# Patient Record
Sex: Female | Born: 1945
Health system: Southern US, Community
[De-identification: ages and names within clinical notes are randomized; demographics above are authoritative.]

## PROBLEM LIST (undated history)

## (undated) DIAGNOSIS — K219 Gastro-esophageal reflux disease without esophagitis: Secondary | ICD-10-CM

## (undated) DIAGNOSIS — R51 Headache: Secondary | ICD-10-CM

## (undated) DIAGNOSIS — T7840XA Allergy, unspecified, initial encounter: Secondary | ICD-10-CM

## (undated) DIAGNOSIS — M419 Scoliosis, unspecified: Secondary | ICD-10-CM

## (undated) DIAGNOSIS — R079 Chest pain, unspecified: Secondary | ICD-10-CM

## (undated) DIAGNOSIS — J45909 Unspecified asthma, uncomplicated: Secondary | ICD-10-CM

## (undated) DIAGNOSIS — R011 Cardiac murmur, unspecified: Secondary | ICD-10-CM

## (undated) DIAGNOSIS — M549 Dorsalgia, unspecified: Secondary | ICD-10-CM

## (undated) DIAGNOSIS — G8929 Other chronic pain: Secondary | ICD-10-CM

## (undated) DIAGNOSIS — R0602 Shortness of breath: Secondary | ICD-10-CM

## (undated) DIAGNOSIS — J302 Other seasonal allergic rhinitis: Secondary | ICD-10-CM

## (undated) HISTORY — DX: Allergy, unspecified, initial encounter: T78.40XA

## (undated) HISTORY — PX: COLONOSCOPY: SHX174

## (undated) HISTORY — PX: EYE SURGERY: SHX253

## (undated) HISTORY — PX: BREAST SURGERY: SHX581

## (undated) HISTORY — PX: ABDOMINOPLASTY: SUR9

---
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
## 2001-09-20 ENCOUNTER — Other Ambulatory Visit: Admission: RE | Admit: 2001-09-20 | Discharge: 2001-09-20 | Payer: Self-pay | Admitting: *Deleted

## 2002-09-14 ENCOUNTER — Other Ambulatory Visit: Admission: RE | Admit: 2002-09-14 | Discharge: 2002-09-14 | Payer: Self-pay | Admitting: *Deleted

## 2002-11-14 ENCOUNTER — Ambulatory Visit (HOSPITAL_COMMUNITY): Admission: RE | Admit: 2002-11-14 | Discharge: 2002-11-14 | Payer: Self-pay | Admitting: *Deleted

## 2002-11-14 ENCOUNTER — Encounter: Payer: Self-pay | Admitting: *Deleted

## 2004-04-24 ENCOUNTER — Other Ambulatory Visit: Admission: RE | Admit: 2004-04-24 | Discharge: 2004-04-24 | Payer: Self-pay | Admitting: Obstetrics and Gynecology

## 2005-04-28 ENCOUNTER — Other Ambulatory Visit: Admission: RE | Admit: 2005-04-28 | Discharge: 2005-04-28 | Payer: Self-pay | Admitting: Obstetrics and Gynecology

## 2008-12-28 ENCOUNTER — Other Ambulatory Visit: Admission: RE | Admit: 2008-12-28 | Discharge: 2008-12-28 | Payer: Self-pay | Admitting: Obstetrics and Gynecology

## 2012-03-15 ENCOUNTER — Ambulatory Visit
Admission: RE | Admit: 2012-03-15 | Discharge: 2012-03-15 | Disposition: A | Discharge status: Home / Self Care | Payer: Medicare Other | Source: Ambulatory Visit | Attending: Allergy and Immunology | Admitting: Allergy and Immunology

## 2012-03-15 ENCOUNTER — Other Ambulatory Visit: Payer: Self-pay | Admitting: Allergy and Immunology

## 2012-03-15 DIAGNOSIS — R05 Cough: Secondary | ICD-10-CM

## 2012-06-03 ENCOUNTER — Other Ambulatory Visit: Payer: Self-pay | Admitting: Obstetrics & Gynecology

## 2012-06-03 DIAGNOSIS — Z9189 Other specified personal risk factors, not elsewhere classified: Secondary | ICD-10-CM

## 2012-06-14 ENCOUNTER — Ambulatory Visit (HOSPITAL_COMMUNITY)
Admission: RE | Admit: 2012-06-14 | Discharge: 2012-06-14 | Disposition: A | Discharge status: Home / Self Care | Payer: Medicare Other | Source: Ambulatory Visit | Attending: Obstetrics & Gynecology | Admitting: Obstetrics & Gynecology

## 2012-06-14 DIAGNOSIS — Z780 Asymptomatic menopausal state: Secondary | ICD-10-CM | POA: Insufficient documentation

## 2012-06-14 DIAGNOSIS — Z9189 Other specified personal risk factors, not elsewhere classified: Secondary | ICD-10-CM

## 2012-06-14 DIAGNOSIS — Z13820 Encounter for screening for osteoporosis: Secondary | ICD-10-CM | POA: Insufficient documentation

## 2012-10-13 ENCOUNTER — Other Ambulatory Visit: Payer: Self-pay | Admitting: Obstetrics & Gynecology

## 2012-10-13 DIAGNOSIS — N72 Inflammatory disease of cervix uteri: Secondary | ICD-10-CM

## 2012-10-21 ENCOUNTER — Ambulatory Visit (HOSPITAL_COMMUNITY)
Admission: RE | Admit: 2012-10-21 | Discharge: 2012-10-21 | Disposition: A | Discharge status: Home / Self Care | Payer: Medicare Other | Source: Ambulatory Visit | Attending: Obstetrics & Gynecology | Admitting: Obstetrics & Gynecology

## 2012-10-21 DIAGNOSIS — N72 Inflammatory disease of cervix uteri: Secondary | ICD-10-CM | POA: Insufficient documentation

## 2012-10-21 DIAGNOSIS — D259 Leiomyoma of uterus, unspecified: Secondary | ICD-10-CM | POA: Insufficient documentation

## 2013-02-14 ENCOUNTER — Encounter (HOSPITAL_COMMUNITY): Payer: Self-pay | Admitting: Pharmacist

## 2013-02-22 ENCOUNTER — Telehealth: Payer: Self-pay | Admitting: *Deleted

## 2013-02-22 NOTE — Telephone Encounter (Signed)
Pt called to find out how many times Dr Tamela Oddi has performed the type of surgery that she is having on Friday. Reassure patient that Dr is proficient in that surgery.

## 2013-02-27 ENCOUNTER — Encounter (HOSPITAL_COMMUNITY): Payer: Self-pay

## 2013-02-27 ENCOUNTER — Encounter (HOSPITAL_COMMUNITY)
Admission: RE | Admit: 2013-02-27 | Discharge: 2013-02-27 | Disposition: A | Discharge status: Home / Self Care | Payer: Medicare Other | Source: Ambulatory Visit | Attending: Obstetrics & Gynecology | Admitting: Obstetrics & Gynecology

## 2013-02-27 HISTORY — DX: Cardiac murmur, unspecified: R01.1

## 2013-02-27 HISTORY — DX: Dorsalgia, unspecified: M54.9

## 2013-02-27 HISTORY — DX: Scoliosis, unspecified: M41.9

## 2013-02-27 HISTORY — DX: Unspecified asthma, uncomplicated: J45.909

## 2013-02-27 HISTORY — DX: Other seasonal allergic rhinitis: J30.2

## 2013-02-27 HISTORY — DX: Gastro-esophageal reflux disease without esophagitis: K21.9

## 2013-02-27 HISTORY — DX: Headache: R51

## 2013-02-27 HISTORY — DX: Chest pain, unspecified: R07.9

## 2013-02-27 HISTORY — DX: Other chronic pain: G89.29

## 2013-02-27 HISTORY — DX: Shortness of breath: R06.02

## 2013-02-27 LAB — CBC
Platelets: 282 10*3/uL (ref 150–400)
RDW: 13 % (ref 11.5–15.5)
WBC: 8.3 10*3/uL (ref 4.0–10.5)

## 2013-02-27 NOTE — Patient Instructions (Signed)
Your procedure is scheduled on:03/03/13  Enter through the Main Entrance at :1030am Pick up desk phone and dial [phone] and inform us of your arrival.  Please call [phone] if you have any problems the morning of surgery.  Remember: Do not eat after midnight: Thursday   Take these meds the morning of surgery with a sip of water:Prevacid, Claritin  DO NOT wear jewelry, eye make-up, lipstick,body lotion, or dark fingernail polish. Do not shave for 48 hours prior to surgery.  If you are to be admitted after surgery, leave suitcase in car until your room has been assigned. Patients discharged on the day of surgery will not be allowed to drive home.

## 2013-03-03 ENCOUNTER — Encounter (HOSPITAL_COMMUNITY): Payer: Self-pay | Admitting: Anesthesiology

## 2013-03-03 ENCOUNTER — Encounter (HOSPITAL_COMMUNITY)
Admission: RE | Disposition: A | Discharge status: Home / Self Care | Payer: Self-pay | Source: Ambulatory Visit | Attending: Obstetrics & Gynecology

## 2013-03-03 ENCOUNTER — Ambulatory Visit (HOSPITAL_COMMUNITY): Payer: Medicare Other

## 2013-03-03 ENCOUNTER — Ambulatory Visit (HOSPITAL_COMMUNITY)
Admission: RE | Admit: 2013-03-03 | Discharge: 2013-03-03 | Disposition: A | Discharge status: Home / Self Care | Payer: Medicare Other | Source: Ambulatory Visit | Attending: Obstetrics & Gynecology | Admitting: Obstetrics & Gynecology

## 2013-03-03 ENCOUNTER — Ambulatory Visit (HOSPITAL_COMMUNITY): Payer: Medicare Other | Admitting: Anesthesiology

## 2013-03-03 DIAGNOSIS — N882 Stricture and stenosis of cervix uteri: Secondary | ICD-10-CM | POA: Diagnosis present

## 2013-03-03 DIAGNOSIS — K219 Gastro-esophageal reflux disease without esophagitis: Secondary | ICD-10-CM | POA: Insufficient documentation

## 2013-03-03 HISTORY — PX: EXAMINATION UNDER ANESTHESIA: SHX1540

## 2013-03-03 HISTORY — PX: DILATION AND CURETTAGE OF UTERUS: SHX78

## 2013-03-03 SURGERY — EXAM UNDER ANESTHESIA
Anesthesia: Monitor Anesthesia Care | Site: Vagina | Wound class: Clean Contaminated

## 2013-03-03 MED ORDER — LIDOCAINE HCL (CARDIAC) 20 MG/ML IV SOLN
INTRAVENOUS | Status: AC
  Filled 2013-03-03: qty 5

## 2013-03-03 MED ORDER — PROPOFOL INFUSION 10 MG/ML OPTIME
INTRAVENOUS | Status: DC | PRN
  Administered 2013-03-03: 100 ug/kg/min via INTRAVENOUS

## 2013-03-03 MED ORDER — ONDANSETRON HCL 4 MG/2ML IJ SOLN
INTRAMUSCULAR | Status: AC
  Filled 2013-03-03: qty 2

## 2013-03-03 MED ORDER — FENTANYL CITRATE 0.05 MG/ML IJ SOLN
INTRAMUSCULAR | Status: AC
  Filled 2013-03-03: qty 2

## 2013-03-03 MED ORDER — FENTANYL CITRATE 0.05 MG/ML IJ SOLN: 25.0000 ug | INTRAMUSCULAR | Status: DC | PRN

## 2013-03-03 MED ORDER — ONDANSETRON HCL 4 MG/2ML IJ SOLN: 4.0000 mg | Freq: Four times a day (QID) | INTRAMUSCULAR | Status: DC | PRN

## 2013-03-03 MED ORDER — MIDAZOLAM HCL 2 MG/2ML IJ SOLN
INTRAMUSCULAR | Status: AC
  Filled 2013-03-03: qty 2

## 2013-03-03 MED ORDER — PROPOFOL 10 MG/ML IV EMUL
INTRAVENOUS | Status: DC | PRN
  Administered 2013-03-03: 30 mg via INTRAVENOUS
  Administered 2013-03-03: 20 mg via INTRAVENOUS

## 2013-03-03 MED ORDER — ACETAMINOPHEN 325 MG PO TABS: 650.0000 mg | ORAL_TABLET | ORAL | Status: DC | PRN

## 2013-03-03 MED ORDER — DEXAMETHASONE SODIUM PHOSPHATE 10 MG/ML IJ SOLN
INTRAMUSCULAR | Status: DC | PRN
  Administered 2013-03-03: 10 mg via INTRAVENOUS

## 2013-03-03 MED ORDER — KETOROLAC TROMETHAMINE 30 MG/ML IJ SOLN
15.0000 mg | Freq: Once | INTRAMUSCULAR | Status: AC | PRN
  Administered 2013-03-03: 30 mg via INTRAVENOUS

## 2013-03-03 MED ORDER — MIDAZOLAM HCL 5 MG/5ML IJ SOLN
INTRAMUSCULAR | Status: DC | PRN
  Administered 2013-03-03: 2 mg via INTRAVENOUS

## 2013-03-03 MED ORDER — LACTATED RINGERS IV SOLN
INTRAVENOUS | Status: DC
  Administered 2013-03-03: 13:00:00 via INTRAVENOUS
  Administered 2013-03-03: 1000 mL via INTRAVENOUS
  Administered 2013-03-03: 11:00:00 via INTRAVENOUS

## 2013-03-03 MED ORDER — FENTANYL CITRATE 0.05 MG/ML IJ SOLN
INTRAMUSCULAR | Status: DC | PRN
  Administered 2013-03-03: 100 ug via INTRAVENOUS

## 2013-03-03 MED ORDER — ONDANSETRON HCL 4 MG/2ML IJ SOLN
INTRAMUSCULAR | Status: DC | PRN
  Administered 2013-03-03: 4 mg via INTRAVENOUS

## 2013-03-03 MED ORDER — PROPOFOL 10 MG/ML IV EMUL
INTRAVENOUS | Status: AC
  Filled 2013-03-03: qty 20

## 2013-03-03 MED ORDER — OXYCODONE HCL 5 MG PO TABS: 5.0000 mg | ORAL_TABLET | ORAL | Status: DC | PRN

## 2013-03-03 MED ORDER — KETOROLAC TROMETHAMINE 30 MG/ML IJ SOLN
INTRAMUSCULAR | Status: AC
  Filled 2013-03-03: qty 1

## 2013-03-03 MED ORDER — GLYCOPYRROLATE 0.2 MG/ML IJ SOLN
INTRAMUSCULAR | Status: AC
  Filled 2013-03-03: qty 1

## 2013-03-03 MED ORDER — DEXAMETHASONE SODIUM PHOSPHATE 10 MG/ML IJ SOLN
INTRAMUSCULAR | Status: AC
  Filled 2013-03-03: qty 1

## 2013-03-03 MED ORDER — OXYCODONE-ACETAMINOPHEN 5-325 MG PO TABS: 2.0000 | ORAL_TABLET | Freq: Four times a day (QID) | ORAL | Status: DC | PRN

## 2013-03-03 MED ORDER — ACETAMINOPHEN 650 MG RE SUPP: 650.0000 mg | RECTAL | Status: DC | PRN

## 2013-03-03 MED ORDER — LIDOCAINE HCL (PF) 1 % IJ SOLN
INTRAMUSCULAR | Status: DC | PRN
  Administered 2013-03-03: 10 mL

## 2013-03-03 MED ORDER — SODIUM CHLORIDE 0.9 % IJ SOLN: 3.0000 mL | INTRAMUSCULAR | Status: DC | PRN

## 2013-03-03 MED ORDER — SODIUM CHLORIDE 0.9 % IV SOLN: 250.0000 mL | INTRAVENOUS | Status: DC | PRN

## 2013-03-03 MED ORDER — PROMETHAZINE HCL 25 MG/ML IJ SOLN
6.2500 mg | INTRAMUSCULAR | Status: DC | PRN
  Administered 2013-03-03: 12.5 mg via INTRAVENOUS

## 2013-03-03 MED ORDER — SODIUM CHLORIDE 0.9 % IJ SOLN: 3.0000 mL | Freq: Two times a day (BID) | INTRAMUSCULAR | Status: DC

## 2013-03-03 MED ORDER — PROMETHAZINE HCL 25 MG/ML IJ SOLN
INTRAMUSCULAR | Status: AC
  Filled 2013-03-03: qty 1

## 2013-03-03 MED ORDER — LIDOCAINE HCL (CARDIAC) 20 MG/ML IV SOLN
INTRAVENOUS | Status: DC | PRN
  Administered 2013-03-03: 60 mg via INTRAVENOUS

## 2013-03-03 SURGICAL SUPPLY — 4 items
CLOTH BEACON ORANGE TIMEOUT ST (SAFETY) ×2 IMPLANT
COVER TABLE BACK 60X90 (DRAPES) ×2 IMPLANT
GLOVE BIO SURGEON STRL SZ 6.5 (GLOVE) ×2 IMPLANT
GOWN STRL REIN XL XLG (GOWN DISPOSABLE) ×4 IMPLANT

## 2013-03-03 NOTE — OR Nursing (Signed)
Ultrasound tech in room; Sport and exercise psychologist

## 2013-03-03 NOTE — Anesthesia Procedure Notes (Signed)
Procedure Name: MAC Date/Time: 03/03/2013 12:24 PM Performed by: Graciela Husbands Pre-anesthesia Checklist: Emergency Drugs available, Timeout performed, Suction available, Patient identified and Patient being monitored Patient Re-evaluated:Patient Re-evaluated prior to inductionOxygen Delivery Method: Circle system utilized and Simple face mask Preoxygenation: Pre-oxygenation with 100% oxygen Intubation Type: IV induction Placement Confirmation: positive ETCO2

## 2013-03-03 NOTE — Anesthesia Postprocedure Evaluation (Signed)
  Anesthesia Post Note  Patient: Anne Mills  Procedure(s) Performed: Procedure(s) (LRB): EXAM UNDER ANESTHESIA with ultrasound (N/A) DILATATION AND CURETTAGE (N/A)  Anesthesia type: mac  Patient location: PACU  Post pain: Pain level controlled  Post assessment: Post-op Vital signs reviewed  Last Vitals:  Filed Vitals:   03/03/13 1315  BP:   Pulse: 69  Temp:   Resp: 15    Post vital signs: Reviewed  Level of consciousness: sedated  Complications: No apparent anesthesia complications

## 2013-03-03 NOTE — Op Note (Signed)
Preoperative diagnosis: Cervical stenosis, possible pyocolpos, endocervical polyps  Postoperative diagnosis: Cervical stenosis  Procedure: Diagnostic dilatation and curettage with ultrasound guidance, exam under anesthesia  Surgeon: Antionette Char A  Anesthesia: Managed anesthesia care, paracervical block  Estimated blood loss: Minimal  Urine output: None  IV Fluids: Per anesthesiology  Complications: None  Specimen: PATHOLOGY  Operative Findings: There was stenosis of the external cervical os. Upon dilatation of the endocervical canal there was drainage of a mucus-like material. A gritty texture was noted upon endocervical curettage as well as endometrial curettage. On bimanual exam the cervix felt normal and the parametria were free. A transvaginal ultrasound was performed after the procedure and the endocervical canal appeared normal.  Description of procedure:   The patient was taken to the operating room and placed on the operating table in the semi-lithotomy position in [place] stirrups.  Examination under anesthesia was performed.  The patient was prepped and draped in the usual manner.  After a time-out had been completed, a speculum was placed in the vagina.  The anterior lip of the cervix was grasped with a single-toothed tenaculum.    10 cc of 1% lidocaine were injected at 4 and 7 o'clock to produce a paracervical block.  The scarred exocervix was incised with a 15 blade scalpel.  An endocervical curettage was then performed. The endocervical canal was dilated with Shawnie Pons dilators.  A small, Sims curette was used to perform an endometrial curettage.  All the instruments were removed from the vagina.  Final instrument counts were correct.  The patient was taken to the PACU in stable condition.

## 2013-03-03 NOTE — Anesthesia Preprocedure Evaluation (Signed)
Anesthesia Evaluation  Patient identified by MRN, date of birth, ID band Patient awake    Reviewed: Allergy & Precautions, H&P , NPO status , Patient's Chart, lab work & pertinent test results, reviewed documented beta blocker date and time   History of Anesthesia Complications Negative for: history of anesthetic complications  Airway Mallampati: I TM Distance: >3 FB Neck ROM: full    Dental  (+) Teeth Intact   Pulmonary asthma (allergy-related, used albuterol yesterday) ,  breath sounds clear to auscultation  Pulmonary exam normal       Cardiovascular Exercise Tolerance: Good negative cardio ROS  Rhythm:regular Rate:Normal     Neuro/Psych  Headaches, Chronic back pain, scoliosis, three slipped discs negative psych ROS   GI/Hepatic negative GI ROS, Neg liver ROS, GERD-  Medicated,  Endo/Other  negative endocrine ROS  Renal/GU negative Renal ROS  Female GU complaint     Musculoskeletal   Abdominal   Peds  Hematology negative hematology ROS (+) REFUSES BLOOD PRODUCTS, JEHOVAH'S WITNESS  Anesthesia Other Findings   Reproductive/Obstetrics negative OB ROS                           Anesthesia Physical Anesthesia Plan  ASA: II  Anesthesia Plan: MAC   Post-op Pain Management:    Induction:   Airway Management Planned:   Additional Equipment:   Intra-op Plan:   Post-operative Plan:   Informed Consent: I have reviewed the patients History and Physical, chart, labs and discussed the procedure including the risks, benefits and alternatives for the proposed anesthesia with the patient or authorized representative who has indicated his/her understanding and acceptance.   Dental Advisory Given  Plan Discussed with: CRNA and Surgeon  Anesthesia Plan Comments:         Anesthesia Quick Evaluation

## 2013-03-03 NOTE — H&P (Signed)
  Chief Complaint: 67 y.o.  who presents with cervical stenosis  Details of Present Illness: She presented for a routine exam--a pap smear of the vagina showed inflammatory cells.  On exam there was significant scarring of the vaginal apex--visualization of the cervix was limited.  A pelvic U/S showed a cervix distended with fluid and possible polyps.  There were no vitals taken for this visit.  Past Medical History  Diagnosis Date  . GERD (gastroesophageal reflux disease)   . Seasonal allergies   . Heart murmur     "not serious" per pt  . Chest pain     under left breast- 2-3 x per week: h/o breast surgery  . Asthma     no inhaler  . Shortness of breath     per pt due to allergies  . Headache   . Back pain, chronic   . Scoliosis    History   Social History  . Marital Status: Married    Spouse Name: N/A    Number of Children: N/A  . Years of Education: N/A   Occupational History  . Not on file.   Social History Main Topics  . Smoking status: Not on file  . Smokeless tobacco: Not on file  . Alcohol Use: Not on file  . Drug Use: Not on file  . Sexually Active: Not on file   Other Topics Concern  . Not on file   Social History Narrative  . No narrative on file   No family history on file.  Pertinent items are noted in HPI.  Pre-Op Diagnosis: Cervical stenosis, possible cervical polyps   Planned Procedure: Procedure(s): EXAM UNDER ANESTHESIA, possible colposcopy with endocervical curettage  I have reviewed the patient's history and have completed the physical exam and ALOHILANI LEVENHAGEN is acceptable for surgery.  Roseanna Rainbow, MD 03/03/2013 9:25 AM

## 2013-03-03 NOTE — Transfer of Care (Signed)
Immediate Anesthesia Transfer of Care Note  Patient: Anne Mills  Procedure(s) Performed: Procedure(s): EXAM UNDER ANESTHESIA with ultrasound (N/A) DILATATION AND CURETTAGE (N/A)  Patient Location: PACU  Anesthesia Type:MAC  Level of Consciousness: awake, alert  and oriented  Airway & Oxygen Therapy: Patient Spontanous Breathing and Patient connected to nasal cannula oxygen  Post-op Assessment: Report given to PACU RN and Post -op Vital signs reviewed and stable  Post vital signs: Reviewed and stable  Complications: No apparent anesthesia complications

## 2013-03-06 ENCOUNTER — Encounter (HOSPITAL_COMMUNITY): Payer: Self-pay | Admitting: Obstetrics & Gynecology

## 2013-03-08 ENCOUNTER — Ambulatory Visit (INDEPENDENT_AMBULATORY_CARE_PROVIDER_SITE_OTHER): Payer: Medicare Other | Admitting: Obstetrics & Gynecology

## 2013-03-08 ENCOUNTER — Encounter: Payer: Self-pay | Admitting: Obstetrics & Gynecology

## 2013-03-08 VITALS — BP 90/56 | HR 87 | Temp 98.3°F | Ht 61.0 in | Wt 122.0 lb

## 2013-03-08 DIAGNOSIS — N882 Stricture and stenosis of cervix uteri: Secondary | ICD-10-CM

## 2013-03-08 NOTE — Progress Notes (Signed)
.   Subjective:     Anne Mills is a 67 y.o. female who presents to the clinic 5 days status post EUA/Colposcopy for possible endometrial polyp. Eating a regular diet without difficulty. Bowel movements are normal.  Patient states that she has a little left sided pelvic pain and little spotting since Monday.  The following portions of the patient's history were reviewed and updated as appropriate: allergies, current medications, past family history, past medical history, past social history, past surgical history and problem list.  Review of Systems Pertinent items are noted in HPI.    Objective:    BP 90/56  Pulse 87  Temp(Src) 98.3 F (36.8 C) (Oral)  Ht 5\' 1"  (1.549 m)  Wt 122 lb (55.339 kg)  BMI 23.06 kg/m2 General:  alert  Abdomen: soft, bowel sounds active, non-tender  Incision:   n/a    Pelvic: SPEC: scant mucous, blood-tinged; uterus AV, NT, adnexa NT Assessment:    Doing well postoperatively. Operative findings again reviewed. Pathology report discussed.    Plan:    1. Continue any current medications.  2. Follow up: 4 weeks.

## 2013-04-26 ENCOUNTER — Ambulatory Visit (INDEPENDENT_AMBULATORY_CARE_PROVIDER_SITE_OTHER): Payer: Medicare Other | Admitting: Obstetrics & Gynecology

## 2013-04-26 ENCOUNTER — Encounter: Payer: Self-pay | Admitting: Obstetrics & Gynecology

## 2013-04-26 VITALS — BP 95/58 | HR 86 | Temp 98.0°F | Ht 61.0 in | Wt 121.0 lb

## 2013-04-26 DIAGNOSIS — N882 Stricture and stenosis of cervix uteri: Secondary | ICD-10-CM

## 2013-04-26 NOTE — Patient Instructions (Signed)

## 2013-04-26 NOTE — Progress Notes (Signed)
.   Subjective:     Anne Mills is a 67 y.o. female who presents to the clinic 7 weeks status post EUA/ Colposcopy for Endometrial Polyp. Eating a regular diet with difficulty. Bowel movements are normal. The patient is not having any pain.  The following portions of the patient's history were reviewed and updated as appropriate: allergies, current medications, past family history, past medical history, past social history, past surgical history and problem list.  Review of Systems Pertinent items are noted in HPI.    Objective:    BP 95/58  Pulse 86  Temp(Src) 98 F (36.7 C) (Oral)  Ht 5\' 1"  (1.549 m)  Wt 121 lb (54.885 kg)  BMI 22.87 kg/m2 General:  alert  Abdomen: soft, bowel sounds active, non-tender       Pelvic: uterus NT, adnexa NT Assessment:    Doing well postoperatively. Operative findings again reviewed. Pathology report discussed.    Plan:    Continue any current medications. Follow up: prn

## 2014-05-31 ENCOUNTER — Other Ambulatory Visit: Payer: Self-pay | Admitting: Orthopedic Surgery

## 2014-06-18 ENCOUNTER — Encounter (HOSPITAL_BASED_OUTPATIENT_CLINIC_OR_DEPARTMENT_OTHER): Payer: Self-pay | Admitting: *Deleted

## 2014-06-21 ENCOUNTER — Encounter (HOSPITAL_BASED_OUTPATIENT_CLINIC_OR_DEPARTMENT_OTHER)
Admission: RE | Disposition: A | Discharge status: Home / Self Care | Payer: Self-pay | Source: Ambulatory Visit | Attending: Orthopedic Surgery

## 2014-06-21 ENCOUNTER — Encounter (HOSPITAL_BASED_OUTPATIENT_CLINIC_OR_DEPARTMENT_OTHER): Payer: Self-pay | Admitting: Anesthesiology

## 2014-06-21 ENCOUNTER — Ambulatory Visit (HOSPITAL_BASED_OUTPATIENT_CLINIC_OR_DEPARTMENT_OTHER)
Admission: RE | Admit: 2014-06-21 | Discharge: 2014-06-21 | Disposition: A | Discharge status: Home / Self Care | Payer: Medicare Other | Source: Ambulatory Visit | Attending: Orthopedic Surgery | Admitting: Orthopedic Surgery

## 2014-06-21 ENCOUNTER — Ambulatory Visit (HOSPITAL_BASED_OUTPATIENT_CLINIC_OR_DEPARTMENT_OTHER): Payer: Medicare Other | Admitting: Anesthesiology

## 2014-06-21 ENCOUNTER — Encounter (HOSPITAL_BASED_OUTPATIENT_CLINIC_OR_DEPARTMENT_OTHER): Payer: Medicare Other | Admitting: Anesthesiology

## 2014-06-21 DIAGNOSIS — K219 Gastro-esophageal reflux disease without esophagitis: Secondary | ICD-10-CM | POA: Insufficient documentation

## 2014-06-21 DIAGNOSIS — J45909 Unspecified asthma, uncomplicated: Secondary | ICD-10-CM | POA: Insufficient documentation

## 2014-06-21 DIAGNOSIS — Z79899 Other long term (current) drug therapy: Secondary | ICD-10-CM | POA: Insufficient documentation

## 2014-06-21 DIAGNOSIS — M4120 Other idiopathic scoliosis, site unspecified: Secondary | ICD-10-CM | POA: Insufficient documentation

## 2014-06-21 DIAGNOSIS — R011 Cardiac murmur, unspecified: Secondary | ICD-10-CM | POA: Diagnosis not present

## 2014-06-21 DIAGNOSIS — M6530 Trigger finger, unspecified finger: Secondary | ICD-10-CM | POA: Diagnosis present

## 2014-06-21 HISTORY — PX: TRIGGER FINGER RELEASE: SHX641

## 2014-06-21 LAB — POCT HEMOGLOBIN-HEMACUE: Hemoglobin: 13.1 g/dL (ref 12.0–15.0)

## 2014-06-21 SURGERY — RELEASE, A1 PULLEY, FOR TRIGGER FINGER
Anesthesia: General | Site: Thumb | Laterality: Left

## 2014-06-21 MED ORDER — OXYCODONE HCL 5 MG PO TABS: 5.0000 mg | ORAL_TABLET | Freq: Once | ORAL | Status: DC | PRN

## 2014-06-21 MED ORDER — LIDOCAINE HCL (CARDIAC) 20 MG/ML IV SOLN
INTRAVENOUS | Status: DC | PRN
  Administered 2014-06-21: 40 mg via INTRAVENOUS

## 2014-06-21 MED ORDER — CHLORHEXIDINE GLUCONATE 4 % EX LIQD: 60.0000 mL | Freq: Once | CUTANEOUS | Status: DC

## 2014-06-21 MED ORDER — MIDAZOLAM HCL 2 MG/2ML IJ SOLN: 1.0000 mg | INTRAMUSCULAR | Status: DC | PRN

## 2014-06-21 MED ORDER — FENTANYL CITRATE 0.05 MG/ML IJ SOLN
INTRAMUSCULAR | Status: DC | PRN
  Administered 2014-06-21 (×2): 50 ug via INTRAVENOUS

## 2014-06-21 MED ORDER — OXYCODONE HCL 5 MG/5ML PO SOLN: 5.0000 mg | Freq: Once | ORAL | Status: DC | PRN

## 2014-06-21 MED ORDER — HYDROCODONE-ACETAMINOPHEN 5-325 MG PO TABS: ORAL_TABLET | ORAL | Status: DC

## 2014-06-21 MED ORDER — FENTANYL CITRATE 0.05 MG/ML IJ SOLN
INTRAMUSCULAR | Status: AC
  Filled 2014-06-21: qty 6

## 2014-06-21 MED ORDER — CEFAZOLIN SODIUM-DEXTROSE 2-3 GM-% IV SOLR
INTRAVENOUS | Status: DC | PRN
  Administered 2014-06-21: 2 g via INTRAVENOUS

## 2014-06-21 MED ORDER — METOCLOPRAMIDE HCL 5 MG/ML IJ SOLN: 10.0000 mg | Freq: Once | INTRAMUSCULAR | Status: DC | PRN

## 2014-06-21 MED ORDER — PROPOFOL 10 MG/ML IV BOLUS
INTRAVENOUS | Status: AC
  Filled 2014-06-21: qty 60

## 2014-06-21 MED ORDER — MIDAZOLAM HCL 2 MG/2ML IJ SOLN
INTRAMUSCULAR | Status: AC
  Filled 2014-06-21: qty 2

## 2014-06-21 MED ORDER — FENTANYL CITRATE 0.05 MG/ML IJ SOLN: 25.0000 ug | INTRAMUSCULAR | Status: DC | PRN

## 2014-06-21 MED ORDER — BUPIVACAINE HCL (PF) 0.25 % IJ SOLN
INTRAMUSCULAR | Status: DC | PRN
  Administered 2014-06-21: 5 mL

## 2014-06-21 MED ORDER — DEXAMETHASONE SODIUM PHOSPHATE 4 MG/ML IJ SOLN
INTRAMUSCULAR | Status: DC | PRN
  Administered 2014-06-21: 10 mg via INTRAVENOUS

## 2014-06-21 MED ORDER — FENTANYL CITRATE 0.05 MG/ML IJ SOLN: 50.0000 ug | INTRAMUSCULAR | Status: DC | PRN

## 2014-06-21 MED ORDER — CEFAZOLIN SODIUM-DEXTROSE 2-3 GM-% IV SOLR
INTRAVENOUS | Status: AC
  Filled 2014-06-21: qty 50

## 2014-06-21 MED ORDER — LACTATED RINGERS IV SOLN
INTRAVENOUS | Status: DC
  Administered 2014-06-21: 08:00:00 via INTRAVENOUS

## 2014-06-21 MED ORDER — PROPOFOL 10 MG/ML IV BOLUS
INTRAVENOUS | Status: DC | PRN
  Administered 2014-06-21: 200 mg via INTRAVENOUS

## 2014-06-21 MED ORDER — MIDAZOLAM HCL 5 MG/5ML IJ SOLN
INTRAMUSCULAR | Status: DC | PRN
  Administered 2014-06-21: 2 mg via INTRAVENOUS

## 2014-06-21 MED ORDER — SUCCINYLCHOLINE CHLORIDE 20 MG/ML IJ SOLN
INTRAMUSCULAR | Status: AC
  Filled 2014-06-21: qty 3

## 2014-06-21 MED ORDER — CEFAZOLIN SODIUM-DEXTROSE 2-3 GM-% IV SOLR: 2.0000 g | INTRAVENOUS | Status: DC

## 2014-06-21 MED ORDER — BUPIVACAINE HCL (PF) 0.25 % IJ SOLN
INTRAMUSCULAR | Status: AC
  Filled 2014-06-21: qty 60

## 2014-06-21 SURGICAL SUPPLY — 36 items
BANDAGE COBAN STERILE 2 (GAUZE/BANDAGES/DRESSINGS) ×3 IMPLANT
BLADE MINI RND TIP GREEN BEAV (BLADE) IMPLANT
BLADE SURG 15 STRL LF DISP TIS (BLADE) ×2 IMPLANT
BLADE SURG 15 STRL SS (BLADE) ×6
BNDG CMPR 9X4 STRL LF SNTH (GAUZE/BANDAGES/DRESSINGS)
BNDG CONFORM 2 STRL LF (GAUZE/BANDAGES/DRESSINGS) ×3 IMPLANT
BNDG ESMARK 4X9 LF (GAUZE/BANDAGES/DRESSINGS) IMPLANT
CHLORAPREP W/TINT 26ML (MISCELLANEOUS) ×3 IMPLANT
CORDS BIPOLAR (ELECTRODE) ×3 IMPLANT
COVER MAYO STAND STRL (DRAPES) ×3 IMPLANT
COVER TABLE BACK 60X90 (DRAPES) ×3 IMPLANT
CUFF TOURNIQUET SINGLE 18IN (TOURNIQUET CUFF) ×3 IMPLANT
DRAPE EXTREMITY T 121X128X90 (DRAPE) ×3 IMPLANT
DRAPE SURG 17X23 STRL (DRAPES) ×3 IMPLANT
GAUZE SPONGE 4X4 12PLY STRL (GAUZE/BANDAGES/DRESSINGS) ×3 IMPLANT
GAUZE XEROFORM 1X8 LF (GAUZE/BANDAGES/DRESSINGS) ×3 IMPLANT
GLOVE BIO SURGEON STRL SZ7.5 (GLOVE) ×3 IMPLANT
GLOVE BIOGEL PI IND STRL 8 (GLOVE) ×1 IMPLANT
GLOVE BIOGEL PI INDICATOR 8 (GLOVE) ×2
GLOVE SURG SS PI 7.0 STRL IVOR (GLOVE) ×2 IMPLANT
GOWN STRL REUS W/ TWL LRG LVL3 (GOWN DISPOSABLE) ×1 IMPLANT
GOWN STRL REUS W/TWL LRG LVL3 (GOWN DISPOSABLE) ×3
GOWN STRL REUS W/TWL XL LVL3 (GOWN DISPOSABLE) ×3 IMPLANT
NDL HYPO 25X1 1.5 SAFETY (NEEDLE) IMPLANT
NEEDLE HYPO 25X1 1.5 SAFETY (NEEDLE) IMPLANT
NS IRRIG 1000ML POUR BTL (IV SOLUTION) ×3 IMPLANT
PACK BASIN DAY SURGERY FS (CUSTOM PROCEDURE TRAY) ×3 IMPLANT
PADDING CAST ABS 4INX4YD NS (CAST SUPPLIES)
PADDING CAST ABS COTTON 4X4 ST (CAST SUPPLIES) ×1 IMPLANT
STOCKINETTE 4X48 STRL (DRAPES) ×3 IMPLANT
SUT ETHILON 4 0 PS 2 18 (SUTURE) ×3 IMPLANT
SYR BULB 3OZ (MISCELLANEOUS) ×3 IMPLANT
SYRINGE CONTROL L 12CC (SYRINGE) IMPLANT
SYRINGE CONTROL LL 12CC (SYRINGE) IMPLANT
TOWEL OR 17X24 6PK STRL BLUE (TOWEL DISPOSABLE) ×6 IMPLANT
UNDERPAD 30X30 INCONTINENT (UNDERPADS AND DIAPERS) ×3 IMPLANT

## 2014-06-21 NOTE — Anesthesia Preprocedure Evaluation (Addendum)
Anesthesia Evaluation  Patient identified by MRN, date of birth, ID band Patient awake    Reviewed: Allergy & Precautions, H&P , NPO status , Patient's Chart, lab work & pertinent test results, reviewed documented beta blocker date and time   Airway Mallampati: II TM Distance: >3 FB Neck ROM: full    Dental   Pulmonary shortness of breath, asthma ,  breath sounds clear to auscultation        Cardiovascular + Valvular Problems/Murmurs Rhythm:regular     Neuro/Psych  Headaches, negative psych ROS   GI/Hepatic Neg liver ROS, GERD-  ,  Endo/Other  negative endocrine ROS  Renal/GU negative Renal ROS  negative genitourinary   Musculoskeletal   Abdominal   Peds  Hematology negative hematology ROS (+)   Anesthesia Other Findings See surgeon's H&P   Reproductive/Obstetrics negative OB ROS                           Anesthesia Physical Anesthesia Plan  ASA: II  Anesthesia Plan: General   Post-op Pain Management:    Induction: Intravenous  Airway Management Planned: LMA  Additional Equipment:   Intra-op Plan:   Post-operative Plan:   Informed Consent: I have reviewed the patients History and Physical, chart, labs and discussed the procedure including the risks, benefits and alternatives for the proposed anesthesia with the patient or authorized representative who has indicated his/her understanding and acceptance.   Dental Advisory Given  Plan Discussed with: CRNA and Surgeon  Anesthesia Plan Comments:        Anesthesia Quick Evaluation

## 2014-06-21 NOTE — Op Note (Signed)
[id number] 

## 2014-06-21 NOTE — Op Note (Signed)
Anne Mills, BRACH NO.:  [id number]  MEDICAL RECORD NO.:  [id number]  LOCATION:                                 FACILITY:  PHYSICIAN:  Leanora Cover, MD             DATE OF BIRTH:  DATE OF PROCEDURE:  06/21/2014 DATE OF DISCHARGE:                              OPERATIVE REPORT   PREOPERATIVE DIAGNOSIS:  Left thumb trigger digit.  POSTOPERATIVE DIAGNOSIS:  Left thumb trigger digit.  PROCEDURE:  Left thumb trigger release.  SURGEON:  Leanora Cover, MD  ASSISTANTS:  None.  ANESTHESIA:  General.  IV FLUIDS:  Per anesthesia flow sheet.  ESTIMATED BLOOD LOSS:  Minimal.  COMPLICATIONS:  None.  SPECIMENS:  None.  TOURNIQUET TIME:  9 minutes.  DISPOSITION:  Stable to PACU.  INDICATIONS:  Anne Mills is a 68 year old female who has had triggering of her left thumb.  This has been injected twice with recurrence.  She wished to have it released surgically.  Risks, benefits, and alternatives of surgery were discussed including risk of blood loss, infection, damage to nerves, vessels, tendons, ligaments, bone, failure of surgery, need for additional surgery, complications with wound healing, continued pain, continued triggering.  She voiced understanding of these risks and elected to proceed.  OPERATIVE COURSE:  After being identified preoperatively by myself, the patient and I agreed upon procedure and site of procedure.  Surgical site was marked.  The risks, benefits, and alternatives of surgery were reviewed and she wished to proceed.  Surgical consent had been signed. She was given IV Ancef as preoperative antibiotic prophylaxis.  She was transported to the operating room, placed on the operating room table in supine position with the left upper extremity on arm board.  General anesthesia was induced by anesthesiologist.  The left upper extremity was prepped and draped in normal sterile orthopedic fashion.  A surgical pause was performed between  surgeons, anesthesia, and operating room staff, and all were in agreement as to the patient, procedure, and site of procedure.  Tourniquet at the proximal aspect of the extremity was inflated to 250 mmHg after exsanguination of the limb with Esmarch bandage.  Incision was made at the proximal flexion crease of the thumb through the skin only.  Spreading technique was used in the subcutaneous tissues.  The radial and ulnar digital nerves were identified and protected throughout the case.  The A1 pulley was identified and incised sharply.  It was very thickened.  It was incised in its entirety.  Care was taken to protect the oblique pulley.  The tendon was brought out through the wound.  There was no recurrence of triggering.  There was some nodularity to the tendon noted as well as some separation of the tendon fibers.  The wound was copiously irrigated with sterile saline. It was then closed with 4-0 nylon in a horizontal mattress fashion.  It was injected with 5 mL of 0.25% plain Marcaine to aid in postoperative analgesia.  It was then dressed with sterile Xeroform, 4x4s, and wrapped with a Kling and Coban dressing lightly.  Tourniquet was deflated at 90 minutes.  Fingertips were pink with brisk capillary  refill after deflation of the tourniquet.  Operative drapes were broken down and the patient was awoken from anesthesia safely.  She was transferred back to a stretcher and taken to the PACU in stable condition.  I will see her back in the office in 1 week for postoperative followup.  I will give her Norco 5/325, 1-2 p.o. q.6 hours p.r.n. pain, dispensed #20.     Leanora Cover, MD     KK/MEDQ  D:  06/21/2014  T:  06/21/2014  Job:  [id number]

## 2014-06-21 NOTE — Transfer of Care (Signed)
Immediate Anesthesia Transfer of Care Note  Patient: Anne Mills  Procedure(s) Performed: Procedure(s): LEFT THUMB TRIGGER RELEASE (Left)  Patient Location: PACU  Anesthesia Type:General  Level of Consciousness: sedated  Airway & Oxygen Therapy: Patient Spontanous Breathing and Patient connected to face mask oxygen  Post-op Assessment: Report given to PACU RN and Post -op Vital signs reviewed and stable  Post vital signs: Reviewed and stable  Complications: No apparent anesthesia complications

## 2014-06-21 NOTE — Brief Op Note (Signed)
06/21/2014  9:10 AM  PATIENT:  Anne Mills  68 y.o. female  PRE-OPERATIVE DIAGNOSIS:  LEFT THUMB TRIGGER DIGIT  POST-OPERATIVE DIAGNOSIS:  LEFT THUMB TRIGGER DIGIT  PROCEDURE:  Procedure(s): LEFT THUMB TRIGGER RELEASE (Left)  SURGEON:  Surgeon(s) and Role:    * Tennis Must, MD - Primary  PHYSICIAN ASSISTANT:   ASSISTANTS: none   ANESTHESIA:   general  EBL:  Total I/O In: 900 [I.V.:900] Out: -   BLOOD ADMINISTERED:none  DRAINS: none   LOCAL MEDICATIONS USED:  MARCAINE     SPECIMEN:  No Specimen  DISPOSITION OF SPECIMEN:  N/A  COUNTS:  YES  TOURNIQUET:   Total Tourniquet Time Documented: Upper Arm (Left) - 9 minutes Total: Upper Arm (Left) - 9 minutes   DICTATION: .Other Dictation: Dictation Number [id number]  PLAN OF CARE: Discharge to home after PACU  PATIENT DISPOSITION:  PACU - hemodynamically stable.

## 2014-06-21 NOTE — H&P (Signed)
Anne Mills is an 68 y.o. female.   Chief Complaint: left thumb trigger digit HPI: 68 yo rhd female with triggering left thumb x 1 year.  Has had it injected twice with recurrence.  It is bothersome to her.  She wishes to have a trigger release for management of symptoms.  Past Medical History  Diagnosis Date  . GERD (gastroesophageal reflux disease)   . Seasonal allergies   . Chest pain     under left breast- 2-3 x per week: h/o breast surgery  . Asthma     no inhaler  . Shortness of breath     per pt due to allergies  . Headache(784.0)   . Back pain, chronic   . Scoliosis   . Heart murmur     "not serious" per pt-no echo    Past Surgical History  Procedure Laterality Date  . Abdominoplasty    . Examination under anesthesia N/A 03/03/2013    Procedure: EXAM UNDER ANESTHESIA with ultrasound;  Surgeon: Lahoma Crocker, MD;  Location: [facility];  Service: Gynecology;  Laterality: N/A;  . Dilation and curettage of uterus N/A 03/03/2013    Procedure: DILATATION AND CURETTAGE;  Surgeon: Lahoma Crocker, MD;  Location: [facility];  Service: Gynecology;  Laterality: N/A;  . Breast surgery      lt cyst  . Colonoscopy      History reviewed. No pertinent family history. Social History:  reports that she has never smoked. She does not have any smokeless tobacco history on file. She reports that she does not drink alcohol or use illicit drugs.  Allergies:  Allergies  Allergen Reactions  . Gluten Meal Cough    Medications Prior to Admission  Medication Sig Dispense Refill  . Calcium Carbonate-Simethicone (ROLAIDS MULTI-SYMPTOM PO) Take 2 tablets by mouth at bedtime.      . Calcium Carbonate-Vitamin D (CALCIUM 600 + D PO) Take 1 tablet by mouth daily.      . Cholecalciferol 2000 UNITS TABS Take 1 tablet by mouth daily.      . Chromium Picolinate 200 MCG TABS Take 1 tablet by mouth daily.      Marland Kitchen EVENING PRIMROSE OIL PO Take 1 tablet by mouth daily.      Marland Kitchen loratadine (CLARITIN)  10 MG tablet Take 10 mg by mouth daily.      . magnesium oxide (MAG-OX) 400 (241.3 MG) MG tablet Take 400 mg by mouth daily.      . Misc Natural Products (OSTEO BI-FLEX TRIPLE STRENGTH) TABS Take 2 tablets by mouth daily.      . Multiple Vitamin (MULTIVITAMIN) tablet Take 1 tablet by mouth daily.      . Multiple Vitamins-Minerals (STRESS TAB NF) TABS Take 1 tablet by mouth daily.      . Nutritional Supplements (MENOPAUSE FORMULA PO) Take 1 tablet by mouth 2 (two) times daily. Transitions for menopause      . Trolamine Salicylate (ASPERCREME EX) Apply 1 application topically 2 (two) times daily as needed (for back pain).      . vitamin B-12 (CYANOCOBALAMIN) 1000 MCG tablet Take 1,000 mcg by mouth daily.        Results for orders placed during the hospital encounter of 06/21/14 (from the past 48 hour(s))  POCT HEMOGLOBIN-HEMACUE     Status: None   Collection Time    06/21/14  8:07 AM      Result Value Ref Range   Hemoglobin 13.1  12.0 - 15.0 g/dL  No results found.   A comprehensive review of systems was negative except for: Eyes: positive for contacts/glasses Respiratory: positive for asthma, cough and shortness of breath  Blood pressure 125/76, pulse 74, temperature 97.5 F (36.4 C), temperature source Oral, resp. rate 20, height 5\' 1"  (1.549 m), weight 53.071 kg (117 lb), SpO2 98.00%.  General appearance: alert, cooperative and appears stated age Head: Normocephalic, without obvious abnormality, atraumatic Neck: supple, symmetrical, trachea midline Resp: clear to auscultation bilaterally Cardio: regular rate and rhythm GI: non tender Extremities: intact sensation and capillary refill all digits.  +epl/fpl/io.  triggering left thumb. skin intact Pulses: 2+ and symmetric Skin: Skin color, texture, turgor normal. No rashes or lesions Neurologic: Grossly normal Incision/Wound: none  Assessment/Plan Left thumb trigger digit, recurrent after two injections.  Non operative and  operative treatment options were discussed with the patient and patient wishes to proceed with operative treatment. Risks, benefits, and alternatives of surgery were discussed and the patient agrees with the plan of care.   Anne Mills R 06/21/2014, 8:25 AM

## 2014-06-21 NOTE — Discharge Instructions (Addendum)

## 2014-06-21 NOTE — Anesthesia Procedure Notes (Signed)
Procedure Name: LMA Insertion Date/Time: 06/21/2014 8:50 AM Performed by: Toula Moos L Pre-anesthesia Checklist: Patient identified, Emergency Drugs available, Suction available, Patient being monitored and Timeout performed Patient Re-evaluated:Patient Re-evaluated prior to inductionOxygen Delivery Method: Circle System Utilized Preoxygenation: Pre-oxygenation with 100% oxygen Intubation Type: IV induction Ventilation: Mask ventilation without difficulty LMA: LMA inserted LMA Size: 3.0 Number of attempts: 1 Airway Equipment and Method: bite block Placement Confirmation: positive ETCO2 and breath sounds checked- equal and bilateral Tube secured with: Tape Dental Injury: Teeth and Oropharynx as per pre-operative assessment

## 2014-06-21 NOTE — Anesthesia Postprocedure Evaluation (Signed)
Anesthesia Post Note  Patient: Anne Mills  Procedure(s) Performed: Procedure(s) (LRB): LEFT THUMB TRIGGER RELEASE (Left)  Anesthesia type: General  Patient location: PACU  Post pain: Pain level controlled  Post assessment: Patient's Cardiovascular Status Stable  Last Vitals:  Filed Vitals:   06/21/14 0945  BP: 130/73  Pulse: 66  Temp:   Resp: 17    Post vital signs: Reviewed and stable  Level of consciousness: alert  Complications: No apparent anesthesia complications

## 2014-06-22 ENCOUNTER — Encounter (HOSPITAL_BASED_OUTPATIENT_CLINIC_OR_DEPARTMENT_OTHER): Payer: Self-pay | Admitting: Orthopedic Surgery

## 2014-07-02 ENCOUNTER — Encounter: Payer: Self-pay | Admitting: Family Medicine

## 2014-08-22 ENCOUNTER — Ambulatory Visit (HOSPITAL_COMMUNITY)
Admission: RE | Admit: 2014-08-22 | Discharge: 2014-08-22 | Disposition: A | Discharge status: Home / Self Care | Payer: Medicare Other | Source: Ambulatory Visit | Attending: Family Medicine | Admitting: Family Medicine

## 2014-08-22 ENCOUNTER — Telehealth: Payer: Self-pay

## 2014-08-22 ENCOUNTER — Ambulatory Visit (INDEPENDENT_AMBULATORY_CARE_PROVIDER_SITE_OTHER): Payer: Medicare Other | Admitting: Family Medicine

## 2014-08-22 VITALS — BP 106/64 | HR 71 | Temp 97.9°F | Resp 16 | Ht 61.0 in | Wt 117.2 lb

## 2014-08-22 DIAGNOSIS — M79609 Pain in unspecified limb: Secondary | ICD-10-CM

## 2014-08-22 DIAGNOSIS — Z23 Encounter for immunization: Secondary | ICD-10-CM

## 2014-08-22 DIAGNOSIS — M79604 Pain in right leg: Secondary | ICD-10-CM

## 2014-08-22 DIAGNOSIS — M7989 Other specified soft tissue disorders: Secondary | ICD-10-CM | POA: Insufficient documentation

## 2014-08-22 LAB — LIPID PANEL
Cholesterol: 223 mg/dL — ABNORMAL HIGH (ref 0–200)
HDL: 60 mg/dL (ref >39)
LDL Cholesterol: 138 mg/dL — ABNORMAL HIGH (ref 0–99)
Total CHOL/HDL Ratio: 3.7 Ratio
Triglycerides: 124 mg/dL (ref <150)
VLDL: 25 mg/dL (ref 0–40)

## 2014-08-22 LAB — POCT CBC
Granulocyte percent: 54.5 %G (ref 37–80)
HCT, POC: 42.1 % (ref 37.7–47.9)
Hemoglobin: 13.9 g/dL (ref 12.2–16.2)
Lymph, poc: 1.4 (ref 0.6–3.4)
MCH, POC: 29.9 pg (ref 27–31.2)
MCHC: 33.1 g/dL (ref 31.8–35.4)
MCV: 90.3 fL (ref 80–97)
MID (cbc): 0.5 (ref 0–0.9)
MPV: 9.6 fL (ref 0–99.8)
POC Granulocyte: 2.3 (ref 2–6.9)
POC LYMPH PERCENT: 33.7 %L (ref 10–50)
POC MID %: 11.8 %M (ref 0–12)
Platelet Count, POC: 269 10*3/uL (ref 142–424)
RBC: 4.66 M/uL (ref 4.04–5.48)
RDW, POC: 12.9 %
WBC: 4.2 10*3/uL — AB (ref 4.6–10.2)

## 2014-08-22 LAB — COMPREHENSIVE METABOLIC PANEL
ALT: 19 U/L (ref 0–35)
AST: 23 U/L (ref 0–37)
Albumin: 4.5 g/dL (ref 3.5–5.2)
Alkaline Phosphatase: 95 U/L (ref 39–117)
BUN: 13 mg/dL (ref 6–23)
CO2: 26 mEq/L (ref 19–32)
Calcium: 9.6 mg/dL (ref 8.4–10.5)
Chloride: 105 mEq/L (ref 96–112)
Creat: 0.72 mg/dL (ref 0.50–1.10)
Glucose, Bld: 82 mg/dL (ref 70–99)
Potassium: 4.8 mEq/L (ref 3.5–5.3)
Sodium: 142 mEq/L (ref 135–145)
Total Bilirubin: 0.6 mg/dL (ref 0.2–1.2)
Total Protein: 7 g/dL (ref 6.0–8.3)

## 2014-08-22 LAB — TSH: TSH: 0.699 u[IU]/mL (ref 0.350–4.500)

## 2014-08-22 MED ORDER — MELOXICAM 7.5 MG PO TABS: 7.5000 mg | ORAL_TABLET | Freq: Every day | ORAL | Status: DC

## 2014-08-22 NOTE — Progress Notes (Signed)
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 It's mildly uncomfortable but there is no radiation of pain or tenderness in the thigh or posterior calf.  Patient has no history of shortness of breath or chest pain, no swelling in the lower extremity, in no history of clots.  There is also no history of trauma.  Objective: Patient is mild swelling in the anterior compartment of the proximal tib-fib area. She has full range of motion of her knee with no tenderness. There is no ecchymosis or erythema overlying the swelling. It's mildly tender. There is no tenderness in the popliteal or chest anemia serious. She has no edema. She has good distal pulses.  Assessment: I think this is just a tendinitis but it's possible that she has a superficial phlebitis.  Plan: Venous Doppler. Since patient also asked for health maintenance labs, with we will do this today as well. Right leg pain - Plan: POCT CBC, Lipid panel, Comprehensive metabolic panel, TSH, Lower Extremity Venous Duplex Right   Signed Robyn Haber M.D.  Addendum: Negative Doppler; prescribed meloxicam 7.5 daily x10 days

## 2014-08-22 NOTE — Telephone Encounter (Signed)
Pt.notified

## 2014-08-22 NOTE — Progress Notes (Signed)
Right lower extremity venous duplex completed.  Right:  No evidence of DVT, superficial thrombosis, or Baker's cyst.  Left:  Negative for DVT in the common femoral vein.  

## 2014-08-22 NOTE — Telephone Encounter (Signed)
[facility] called w/prelim report - negative for DVT or Baker's cyst. I had them let pt go w/the results and spoke w/Dr L who asked me to send in Meloxicam 7.5 mg 1 tab QD for 10 days and inform pt Dx is tendonitis. LMOM to [facility]. Sent in Rx

## 2014-08-22 NOTE — Patient Instructions (Signed)
Go to [facility] section [place] register for Venous Duplex

## 2014-11-28 LAB — HM MAMMOGRAPHY

## 2015-06-04 ENCOUNTER — Other Ambulatory Visit: Payer: Self-pay | Admitting: Orthopedic Surgery

## 2015-06-04 DIAGNOSIS — M5136 Other intervertebral disc degeneration, lumbar region: Secondary | ICD-10-CM

## 2015-06-19 ENCOUNTER — Ambulatory Visit
Admission: RE | Admit: 2015-06-19 | Discharge: 2015-06-19 | Disposition: A | Discharge status: Home / Self Care | Payer: Medicare Other | Source: Ambulatory Visit | Attending: Orthopedic Surgery | Admitting: Orthopedic Surgery

## 2015-06-19 DIAGNOSIS — M5136 Other intervertebral disc degeneration, lumbar region: Secondary | ICD-10-CM

## 2016-10-09 ENCOUNTER — Other Ambulatory Visit: Payer: Self-pay | Admitting: Family Medicine

## 2016-10-09 DIAGNOSIS — M8588 Other specified disorders of bone density and structure, other site: Secondary | ICD-10-CM

## 2016-12-17 ENCOUNTER — Ambulatory Visit (INDEPENDENT_AMBULATORY_CARE_PROVIDER_SITE_OTHER): Payer: Medicare Other | Admitting: Family Medicine

## 2016-12-17 ENCOUNTER — Other Ambulatory Visit: Payer: Self-pay | Admitting: Emergency Medicine

## 2016-12-17 VITALS — BP 124/72 | HR 95 | Temp 98.2°F | Resp 16 | Ht 60.25 in | Wt 113.6 lb

## 2016-12-17 DIAGNOSIS — J029 Acute pharyngitis, unspecified: Secondary | ICD-10-CM

## 2016-12-17 DIAGNOSIS — J310 Chronic rhinitis: Secondary | ICD-10-CM

## 2016-12-17 DIAGNOSIS — R05 Cough: Secondary | ICD-10-CM

## 2016-12-17 DIAGNOSIS — J329 Chronic sinusitis, unspecified: Secondary | ICD-10-CM | POA: Diagnosis not present

## 2016-12-17 DIAGNOSIS — R058 Other specified cough: Secondary | ICD-10-CM

## 2016-12-17 MED ORDER — AMOXICILLIN-POT CLAVULANATE 875-125 MG PO TABS: 1.0000 | ORAL_TABLET | Freq: Two times a day (BID) | ORAL | 0 refills | Status: DC

## 2016-12-17 MED ORDER — HYDROCODONE-HOMATROPINE 5-1.5 MG/5ML PO SYRP: 5.0000 mL | ORAL_SOLUTION | ORAL | 0 refills | Status: DC | PRN

## 2016-12-17 MED ORDER — BENZONATATE 100 MG PO CAPS: 100.0000 mg | ORAL_CAPSULE | Freq: Three times a day (TID) | ORAL | 0 refills | Status: DC | PRN

## 2016-12-17 MED ORDER — IPRATROPIUM BROMIDE 0.03 % NA SOLN: 2.0000 | Freq: Four times a day (QID) | NASAL | 0 refills | Status: DC

## 2016-12-17 NOTE — Patient Instructions (Addendum)
Continue to drink plenty of fluids and try to get enough rest  Take Tylenol (acetaminophen) or Advil (ibuprofen) if needed for fever or aching.  Take Augmentin (amoxicillin/clavulanate) one pill twice daily after breakfast and supper for antibiotic for the sinuses and cough  Use the Atrovent (ipratropium) nasal spray 2 sprays 4 times daily as needed for runny nose  Take the benzonatate cough pills one or 2 pills 3 times daily as needed for cough  Take the hydrocodone cough syrup 1 teaspoon every 4-6 hours as needed for nighttime cough. Will cause daytime drowsiness, so I recommend primarily using it at night.  Return if not improving  Contact the front desk to discuss setting yourself up a regular primary care physician.   IF you received an x-ray today, you will receive an invoice from [facility]. Please contact [facility] at [phone] with questions or concerns regarding your invoice.   IF you received labwork today, you will receive an invoice from [place]. Please contact [facility] at [phone] with questions or concerns regarding your invoice.   Our billing staff will not be able to assist you with questions regarding bills from these companies.  You will be contacted with the lab results as soon as they are available. The fastest way to get your results is to activate your My Chart account. Instructions are located on the last page of this paperwork. If you have not heard from [place] regarding the results in 2 weeks, please contact this office.

## 2016-12-17 NOTE — Progress Notes (Signed)
Patient ID: Anne Mills, female    DOB: 24-Feb-1946  Age: 71 y.o. MRN: [id number]  Chief Complaint  Patient presents with  . Sore Throat    since Jan 1, 18 was seen at a Walk-in  . head feels full    facial pressure, gland on right side of neck feels swollen    Subjective:   Pleasant lady 71 years old who has had a respiratory tract infection for a couple weeks. On New Year's Day she went elsewhere and was treated as having a viral URI. Symptoms have continued to persist. She says that she told them that these things went on into sinusitis in her experience. She has not been running fevers. She does have a lot of head congestion and cough. The mucus is turned yellow. She does not have any earache. She does have sore throat. She has not been wheezing much, but thinks she may have had a little bit today. Her abdomen is been hurting her some from coughing and swallowing the mucus.  Current allergies, medications, problem list, past/family and social histories reviewed.  Objective:  BP 124/72   Pulse 95   Temp 98.2 F (36.8 C) (Oral)   Resp 16   Ht 5' 0.25" (1.53 m)   Wt 113 lb 9.6 oz (51.5 kg)   SpO2 98%   BMI 22.00 kg/m   Pleasant lady, alert and oriented. Congested in her head with constant sniffling. A little cough. Her TMs are normal sinuses congestion with nose congested throat was not erythematous. She does have a little bit of mucus caught in the left tonsillar crypt. Neck was supple with small nodes. Chest is clear to auscultation. Heart regular without murmur. Abdomen soft with minimal tenderness.  Assessment & Plan:   Assessment: 1. Post-viral cough syndrome   2. Acute pharyngitis, unspecified etiology   3. Rhinosinusitis       Plan: Postviral cough syndrome and rhinosinusitis. See instructions.  No orders of the defined types were placed in this encounter.   Meds ordered this encounter  Medications  . Omega-3 Fatty Acids (FISH OIL OMEGA-3 PO)    Sig: Take by  mouth.  . Multiple Vitamins-Minerals (OCUVITE PO)    Sig: Take by mouth.  . gabapentin (NEURONTIN) 300 MG capsule    Sig: Take 300 mg by mouth 3 (three) times daily.  Marland Kitchen OVER THE COUNTER MEDICATION    Sig: Transitions w/Hesperidin vegetarian taking  . OVER THE COUNTER MEDICATION    Sig: Wal-Finate Dw/Pseudoephedrine taking  . esomeprazole (NEXIUM) 20 MG capsule    Sig: Take 20 mg by mouth daily at 12 noon.  Marland Kitchen amoxicillin-clavulanate (AUGMENTIN) 875-125 MG tablet    Sig: Take 1 tablet by mouth 2 (two) times daily.    Dispense:  14 tablet    Refill:  0  . benzonatate (TESSALON) 100 MG capsule    Sig: Take 1-2 capsules (100-200 mg total) by mouth 3 (three) times daily as needed.    Dispense:  30 capsule    Refill:  0  . ipratropium (ATROVENT) 0.03 % nasal spray    Sig: Place 2 sprays into both nostrils 4 (four) times daily.    Dispense:  30 mL    Refill:  0  . HYDROcodone-homatropine (HYCODAN) 5-1.5 MG/5ML syrup    Sig: Take 5 mLs by mouth every 4 (four) hours as needed.    Dispense:  60 mL    Refill:  0  Patient Instructions   Continue to drink plenty of fluids and try to get enough rest  Take Tylenol (acetaminophen) or Advil (ibuprofen) if needed for fever or aching.  Take Augmentin (amoxicillin/clavulanate) one pill twice daily after breakfast and supper for antibiotic for the sinuses and cough  Use the Atrovent (ipratropium) nasal spray 2 sprays 4 times daily as needed for runny nose  Take the benzonatate cough pills one or 2 pills 3 times daily as needed for cough  Take the hydrocodone cough syrup 1 teaspoon every 4-6 hours as needed for nighttime cough. Will cause daytime drowsiness, so I recommend primarily using it at night.  Return if not improving  Contact the front desk to discuss setting yourself up a regular primary care physician.   IF you received an x-ray today, you will receive an invoice from [facility]. Please contact [facility] at [phone] with questions or concerns regarding your invoice.   IF you received labwork today, you will receive an invoice from [place]. Please contact [facility] at [phone] with questions or concerns regarding your invoice.   Our billing staff will not be able to assist you with questions regarding bills from these companies.  You will be contacted with the lab results as soon as they are available. The fastest way to get your results is to activate your My Chart account. Instructions are located on the last page of this paperwork. If you have not heard from [place] regarding the results in 2 weeks, please contact this office.        Return if symptoms worsen or fail to improve.   Tanaia Hawkey, MD 12/17/2016

## 2016-12-22 ENCOUNTER — Ambulatory Visit (INDEPENDENT_AMBULATORY_CARE_PROVIDER_SITE_OTHER): Payer: Medicare Other | Admitting: Physician Assistant

## 2016-12-22 VITALS — BP 116/68 | HR 82 | Temp 97.6°F | Resp 16 | Ht 61.0 in | Wt 114.0 lb

## 2016-12-22 DIAGNOSIS — R059 Cough, unspecified: Secondary | ICD-10-CM

## 2016-12-22 DIAGNOSIS — J3489 Other specified disorders of nose and nasal sinuses: Secondary | ICD-10-CM | POA: Diagnosis not present

## 2016-12-22 DIAGNOSIS — R05 Cough: Secondary | ICD-10-CM

## 2016-12-22 DIAGNOSIS — J209 Acute bronchitis, unspecified: Secondary | ICD-10-CM | POA: Diagnosis not present

## 2016-12-22 MED ORDER — MUCINEX DM MAXIMUM STRENGTH 60-1200 MG PO TB12: 1.0000 | ORAL_TABLET | Freq: Two times a day (BID) | ORAL | 1 refills | Status: DC

## 2016-12-22 MED ORDER — AZITHROMYCIN 250 MG PO TABS: ORAL_TABLET | ORAL | 0 refills | Status: DC

## 2016-12-22 NOTE — Progress Notes (Signed)
Anne Mills  MRN: [id number] DOB: Dec 12, 1945  PCP: Garnette Czech, MD  Subjective:  Pt is a 71 year old female who presents to clinic for f/u post-viral cough.  She was here five days ago. Prescribed Augmentin, tessalon pearls, Hycodan and ipratropium nasal spray.  Today c/o Headache, nasal drainage. She is still taking the Augmentin, has 2 days left of the medication. No relief. She is using her nasal spray at night.  Drinks about 2 liters of water a day.  Nasal drainage is worse at night and in the morning.  She has had multiple sinus infections in the past. Augmentin does not help her.  History of sinus infection x 40 years. She is headed to the [place] in a few days for 8 days.   Sore throat since 12/07/2016 - She went to [place] on [address]. Was treated for viral illness. Treated with supportive care.   Review of Systems  Constitutional: Negative for chills, diaphoresis, fatigue and fever.  HENT: Positive for sinus pain and sinus pressure. Negative for congestion, postnasal drip, rhinorrhea, sneezing and sore throat.   Respiratory: Negative for cough, chest tightness, shortness of breath and wheezing.   Cardiovascular: Negative for chest pain and palpitations.  Gastrointestinal: Negative for abdominal pain, diarrhea, nausea and vomiting.  Neurological: Negative for weakness, light-headedness and headaches.    Patient Active Problem List   Diagnosis Date Noted  . Cervical stenosis (uterine cervix) 03/03/2013    Current Outpatient Prescriptions on File Prior to Visit  Medication Sig Dispense Refill  . amoxicillin-clavulanate (AUGMENTIN) 875-125 MG tablet Take 1 tablet by mouth 2 (two) times daily. 14 tablet 0  . benzonatate (TESSALON) 100 MG capsule Take 1-2 capsules (100-200 mg total) by mouth 3 (three) times daily as needed. 30 capsule 0  . Calcium Carbonate-Simethicone (ROLAIDS MULTI-SYMPTOM PO) Take 2 tablets by mouth at bedtime.    . Calcium  Carbonate-Vitamin D (CALCIUM 600 + D PO) Take 1 tablet by mouth daily.    . Cholecalciferol 2000 UNITS TABS Take 1 tablet by mouth daily.    . Chromium Picolinate 200 MCG TABS Take 1 tablet by mouth daily.    Marland Kitchen esomeprazole (NEXIUM) 20 MG capsule Take 20 mg by mouth daily at 12 noon.    Marland Kitchen EVENING PRIMROSE OIL PO Take 1 tablet by mouth daily.    Marland Kitchen gabapentin (NEURONTIN) 300 MG capsule Take 300 mg by mouth 3 (three) times daily.    Marland Kitchen HYDROcodone-homatropine (HYCODAN) 5-1.5 MG/5ML syrup Take 5 mLs by mouth every 4 (four) hours as needed. 60 mL 0  . ipratropium (ATROVENT) 0.03 % nasal spray Place 2 sprays into both nostrils 4 (four) times daily. 30 mL 0  . loratadine (CLARITIN) 10 MG tablet Take 10 mg by mouth daily.    . magnesium oxide (MAG-OX) 400 (241.3 MG) MG tablet Take 400 mg by mouth daily.    . meloxicam (MOBIC) 7.5 MG tablet Take 1 tablet (7.5 mg total) by mouth daily. 10 tablet 0  . Misc Natural Products (OSTEO BI-FLEX TRIPLE STRENGTH) TABS Take 2 tablets by mouth daily.    . Multiple Vitamin (MULTIVITAMIN) tablet Take 1 tablet by mouth daily.    . Multiple Vitamins-Minerals (OCUVITE PO) Take by mouth.    . Multiple Vitamins-Minerals (STRESS TAB NF) TABS Take 1 tablet by mouth daily.    . Nutritional Supplements (MENOPAUSE FORMULA PO) Take 1 tablet by mouth 2 (two) times daily. Transitions for menopause    . Omega-3 Fatty  Acids (FISH OIL OMEGA-3 PO) Take by mouth.    Marland Kitchen OVER THE COUNTER MEDICATION Transitions w/Hesperidin vegetarian taking    . OVER THE COUNTER MEDICATION Wal-Finate Dw/Pseudoephedrine taking    . Trolamine Salicylate (ASPERCREME EX) Apply 1 application topically 2 (two) times daily as needed (for back pain).    . vitamin B-12 (CYANOCOBALAMIN) 1000 MCG tablet Take 1,000 mcg by mouth daily.    Marland Kitchen HYDROcodone-acetaminophen (NORCO) 5-325 MG per tablet 1-2 tabs po q6 hours prn pain (Patient not taking: Reported on 12/22/2016) 20 tablet 0   No current facility-administered  medications on file prior to visit.     Allergies  Allergen Reactions  . Gluten Meal Cough     Objective:  BP 116/68   Pulse 82   Temp 97.6 F (36.4 C) (Oral)   Resp 16   Ht 5\' 1"  (1.549 m)   Wt 114 lb (51.7 kg)   SpO2 100%   BMI 21.54 kg/m   Physical Exam  Constitutional: She is oriented to person, place, and time and well-developed, well-nourished, and in no distress. No distress.  HENT:  Nose: Mucosal edema and rhinorrhea present. Right sinus exhibits frontal sinus tenderness. Left sinus exhibits frontal sinus tenderness.  Cardiovascular: Normal rate, regular rhythm and normal heart sounds.   Neurological: She is alert and oriented to person, place, and time. GCS score is 15.  Skin: Skin is warm and dry.  Psychiatric: Mood, memory, affect and judgment normal.  Vitals reviewed.   Assessment and Plan :  1. Nasal drainage 2. Cough 3. Acute bronchitis, unspecified organism - Dextromethorphan-Guaifenesin (MUCINEX DM MAXIMUM STRENGTH) 60-1200 MG TB12; Take 1 tablet by mouth every 12 (twelve) hours.  Dispense: 14 each; Refill: 1 - azithromycin (ZITHROMAX) 250 MG tablet; Take 2 tabs PO x 1 dose, then 1 tab PO QD x 4 days  Dispense: 6 tablet; Refill: 0 - Will alter treatment. Supportive care encouraged. RTC in 5-7 days if no improvement.    Mercer Pod, PA-C  [facility] 12/22/2016 2:04 PM

## 2016-12-22 NOTE — Patient Instructions (Addendum)
Continue your Augmentin until it is finished. After that, take a course of mucinex for several days. Drink plenty of water. If you are still not better, then you may start the Azithromycin.   Please stay well hydrated, drink at least 2 liters of water a day.  Start Tessalon pearls for daytime cough. These will not make you sleepy.  Use your nasal spray at night before bed and in the morning.    Thank you for coming in today. I hope you feel we met your needs.  Feel free to call [facility] if you have any questions or further requests.  Please consider signing up for MyChart if you do not already have it, as this is a great way to communicate with me.  Best,  Whitney McVey, PA-C  IF you received an x-ray today, you will receive an invoice from [facility]. Please contact [facility] at [phone] with questions or concerns regarding your invoice.   IF you received labwork today, you will receive an invoice from [place]. Please contact [facility] at [phone] with questions or concerns regarding your invoice.   Our billing staff will not be able to assist you with questions regarding bills from these companies.  You will be contacted with the lab results as soon as they are available. The fastest way to get your results is to activate your My Chart account. Instructions are located on the last page of this paperwork. If you have not heard from [place] regarding the results in 2 weeks, please contact this office.

## 2017-01-07 ENCOUNTER — Ambulatory Visit (INDEPENDENT_AMBULATORY_CARE_PROVIDER_SITE_OTHER): Payer: Medicare Other | Admitting: Emergency Medicine

## 2017-01-07 VITALS — BP 108/60 | HR 89 | Temp 98.6°F | Resp 16 | Ht 60.5 in | Wt 111.6 lb

## 2017-01-07 DIAGNOSIS — R059 Cough, unspecified: Secondary | ICD-10-CM

## 2017-01-07 DIAGNOSIS — R05 Cough: Secondary | ICD-10-CM | POA: Diagnosis not present

## 2017-01-07 DIAGNOSIS — J209 Acute bronchitis, unspecified: Secondary | ICD-10-CM

## 2017-01-07 MED ORDER — AZITHROMYCIN 250 MG PO TABS: ORAL_TABLET | ORAL | 0 refills | Status: DC

## 2017-01-07 NOTE — Progress Notes (Signed)
Anne Mills 71 y.o.   Chief Complaint  Patient presents with  . Cough    phlegm, white/yellow  . nausea    HISTORY OF PRESENT ILLNESS: This is a 71 y.o. female complaining of persistent productive cough x 3-4 weeks.  Cough  This is a new problem. The current episode started 1 to 4 weeks ago. The problem has been waxing and waning. The problem occurs every few minutes. The cough is productive of sputum. Pertinent negatives include no chest pain, chills, ear congestion, ear pain, eye redness, fever, headaches, heartburn, hemoptysis, myalgias, nasal congestion, rash, rhinorrhea, sore throat, shortness of breath, weight loss or wheezing. Associated symptoms comments: nausea. Risk factors for lung disease include travel ([place] She has tried prescription cough suppressant (Augmentin) for the symptoms. The treatment provided no relief. Her past medical history is significant for asthma and bronchitis. There is no history of COPD, emphysema, environmental allergies or pneumonia.     Prior to Admission medications   Medication Sig Start Date End Date Taking? Authorizing Provider  Calcium Carbonate-Vitamin D (CALCIUM 600 + D PO) Take 1 tablet by mouth daily.   Yes Historical Provider, MD  Cholecalciferol 2000 UNITS TABS Take 1 tablet by mouth daily.   Yes Historical Provider, MD  Chromium Picolinate 200 MCG TABS Take 1 tablet by mouth daily.   Yes Historical Provider, MD  esomeprazole (NEXIUM) 20 MG capsule Take 20 mg by mouth daily at 12 noon.   Yes Historical Provider, MD  gabapentin (NEURONTIN) 300 MG capsule Take 300 mg by mouth 3 (three) times daily.   Yes Historical Provider, MD  HYDROcodone-acetaminophen [facility]) 5-325 MG per tablet 1-2 tabs po q6 hours prn pain 06/21/14  Yes Leanora Cover, MD  HYDROcodone-homatropine [facility]) 5-1.5 MG/5ML syrup Take 5 mLs by mouth every 4 (four) hours as needed. 12/17/16  Yes Posey Boyer, MD  ipratropium (ATROVENT) 0.03 % nasal spray  Place 2 sprays into both nostrils 4 (four) times daily. 12/17/16  Yes Posey Boyer, MD  loratadine (CLARITIN) 10 MG tablet Take 10 mg by mouth daily.   Yes Historical Provider, MD  magnesium oxide (MAG-OX) 400 (241.3 MG) MG tablet Take 400 mg by mouth daily.   Yes Historical Provider, MD  Misc Natural Products (OSTEO BI-FLEX TRIPLE STRENGTH) TABS Take 2 tablets by mouth daily.   Yes Historical Provider, MD  Multiple Vitamins-Minerals (OCUVITE PO) Take by mouth.   Yes Historical Provider, MD  Nutritional Supplements (MENOPAUSE FORMULA PO) Take 1 tablet by mouth 2 (two) times daily. Transitions for menopause   Yes Historical Provider, MD  Omega-3 Fatty Acids (FISH OIL OMEGA-3 PO) Take by mouth.   Yes Historical Provider, MD  Omega-3 Fatty Acids (FISH OIL PO) Take by mouth.   Yes Historical Provider, MD  OVER THE COUNTER MEDICATION Transitions w/Hesperidin vegetarian taking   Yes Historical Provider, MD  OVER THE COUNTER MEDICATION [facility]/Pseudoephedrine taking   Yes Historical Provider, MD  Trolamine Salicylate (ASPERCREME EX) Apply 1 application topically 2 (two) times daily as needed (for back pain).   Yes Historical Provider, MD  vitamin B-12 (CYANOCOBALAMIN) 1000 MCG tablet Take 1,000 mcg by mouth daily.   Yes Historical Provider, MD  azithromycin (ZITHROMAX) 250 MG tablet Sig as indicated 01/07/17   [facility], MD  benzonatate (TESSALON) 100 MG capsule Take 1-2 capsules (100-200 mg total) by mouth 3 (three) times daily as needed. Patient not taking: Reported on 01/07/2017 12/17/16   Posey Boyer, MD  Calcium Carbonate-Simethicone (ROLAIDS MULTI-SYMPTOM PO) Take 2 tablets by mouth at bedtime.    Historical Provider, MD  Dextromethorphan-Guaifenesin (MUCINEX DM MAXIMUM STRENGTH) 60-1200 MG TB12 Take 1 tablet by mouth every 12 (twelve) hours. Patient not taking: Reported on 01/07/2017 12/22/16   Gelene Mink McVey, PA-C  EVENING PRIMROSE OIL PO Take 1 tablet by mouth daily.     Historical Provider, MD  meloxicam (MOBIC) 7.5 MG tablet Take 1 tablet (7.5 mg total) by mouth daily. Patient not taking: Reported on 01/07/2017 08/22/14   Robyn Haber, MD  Multiple Vitamin (MULTIVITAMIN) tablet Take 1 tablet by mouth daily.    Historical Provider, MD  Multiple Vitamins-Minerals (STRESS TAB NF) TABS Take 1 tablet by mouth daily.    Historical Provider, MD    Allergies  Allergen Reactions  . Gluten Meal Cough    Patient Active Problem List   Diagnosis Date Noted  . Cervical stenosis (uterine cervix) 03/03/2013    Past Medical History:  Diagnosis Date  . Asthma    no inhaler  . Back pain, chronic   . Chest pain    under left breast- 2-3 x per week: h/o breast surgery  . GERD (gastroesophageal reflux disease)   . Headache(784.0)   . Heart murmur    "not serious" per pt-no echo  . Scoliosis   . Seasonal allergies   . Shortness of breath    per pt due to allergies    Past Surgical History:  Procedure Laterality Date  . ABDOMINOPLASTY    . BREAST SURGERY     lt cyst  . COLONOSCOPY    . DILATION AND CURETTAGE OF UTERUS N/A 03/03/2013   Procedure: DILATATION AND CURETTAGE;  Surgeon: Lahoma Crocker, MD;  Location: [facility];  Service: Gynecology;  Laterality: N/A;  . EXAMINATION UNDER ANESTHESIA N/A 03/03/2013   Procedure: EXAM UNDER ANESTHESIA with ultrasound;  Surgeon: Lahoma Crocker, MD;  Location: [facility];  Service: Gynecology;  Laterality: N/A;  . TRIGGER FINGER RELEASE Left 06/21/2014   Procedure: LEFT THUMB TRIGGER RELEASE;  Surgeon: Tennis Must, MD;  Location: [facility];  Service: Orthopedics;  Laterality: Left;    Social History   Social History  . Marital status: Married    Spouse name: N/A  . Number of children: N/A  . Years of education: N/A   Occupational History  . Not on file.   Social History Main Topics  . Smoking status: Never Smoker  . Smokeless tobacco: Never Used  . Alcohol use No  . Drug use: No  .  Sexual activity: Not on file   Other Topics Concern  . Not on file   Social History Narrative  . No narrative on file    Family History  Problem Relation Age of Onset  . Cancer Sister   . Diabetes Sister   . Heart disease Sister   . Hyperlipidemia Sister   . Hypertension Sister   . Mental retardation Sister      Review of Systems  Constitutional: Negative.  Negative for chills, fever and weight loss.  HENT: Positive for congestion. Negative for ear pain, nosebleeds, rhinorrhea, sinus pain and sore throat.   Eyes: Negative.  Negative for discharge and redness.  Respiratory: Positive for cough and sputum production. Negative for hemoptysis, shortness of breath and wheezing.   Cardiovascular: Negative for chest pain, palpitations and leg swelling.  Gastrointestinal: Positive for nausea. Negative for abdominal pain, diarrhea, heartburn and vomiting.  Genitourinary: Negative.  Negative for dysuria  and hematuria.  Musculoskeletal: Negative for myalgias.  Skin: Negative.  Negative for rash.  Neurological: Negative for headaches.  Endo/Heme/Allergies: Negative for environmental allergies.  All other systems reviewed and are negative.  Vitals:   01/07/17 1414  BP: 108/60  Pulse: 89  Resp: 16  Temp: 98.6 F (37 C)     Physical Exam  Constitutional: She is oriented to person, place, and time. She appears well-developed and well-nourished.  HENT:  Head: Normocephalic and atraumatic.  Nose: Nose normal.  Mouth/Throat: Oropharynx is clear and moist. No oropharyngeal exudate.  Eyes: Conjunctivae and EOM are normal. Pupils are equal, round, and reactive to light.  Neck: Normal range of motion. Neck supple. No JVD present. No thyromegaly present.  Cardiovascular: Normal rate, regular rhythm and normal heart sounds.   No murmur heard. Pulmonary/Chest: Effort normal and breath sounds normal. She has no wheezes. She has no rales.  Abdominal: Soft. She exhibits no distension. There  is no tenderness.  Musculoskeletal: Normal range of motion.  Lymphadenopathy:    She has no cervical adenopathy.  Neurological: She is alert and oriented to person, place, and time.  Skin: Skin is warm and dry. Capillary refill takes less than 2 seconds.  Psychiatric: She has a normal mood and affect. Her behavior is normal.  Vitals reviewed.    ASSESSMENT & PLAN: [place] was seen today for cough and nausea.  Diagnoses and all orders for this visit:  Acute bronchitis, unspecified organism  Cough  Other orders -     azithromycin (ZITHROMAX) 250 MG tablet; Sig as indicated    Patient Instructions       IF you received an x-ray today, you will receive an invoice from [facility]. Please contact [facility] at [phone] with questions or concerns regarding your invoice.   IF you received labwork today, you will receive an invoice from [facility]. Please contact [facility] at [phone] with questions or concerns regarding your invoice.   Our billing staff will not be able to assist you with questions regarding bills from these companies.  You will be contacted with the lab results as soon as they are available. The fastest way to get your results is to activate your My Chart account. Instructions are located on the last page of this paperwork. If you have not heard from [place] regarding the results in 2 weeks, please contact this office.     Upper Respiratory Infection, Adult Most upper respiratory infections (URIs) are caused by a virus. A URI affects the nose, throat, and upper air passages. The most common type of URI is often called "the common cold." Follow these instructions at home:  Take medicines only as told by your doctor.  Gargle warm saltwater or take cough drops to comfort your throat as told by your doctor.  Use a warm mist humidifier or inhale steam from a shower to increase air moisture. This may make it easier to breathe.  Drink enough  fluid to keep your pee (urine) clear or pale yellow.  Eat soups and other clear broths.  Have a healthy diet.  Rest as needed.  Go back to work when your fever is gone or your doctor says it is okay.  You may need to stay home longer to avoid giving your URI to others.  You can also wear a face mask and wash your hands often to prevent spread of the virus.  Use your inhaler more if you have asthma.  Do not use any tobacco products,  including cigarettes, chewing tobacco, or electronic cigarettes. If you need help quitting, ask your doctor. Contact a doctor if:  You are getting worse, not better.  Your symptoms are not helped by medicine.  You have chills.  You are getting more short of breath.  You have brown or red mucus.  You have yellow or brown discharge from your nose.  You have pain in your face, especially when you bend forward.  You have a fever.  You have puffy (swollen) neck glands.  You have pain while swallowing.  You have white areas in the back of your throat. Get help right away if:  You have very bad or constant:  Headache.  Ear pain.  Pain in your forehead, behind your eyes, and over your cheekbones (sinus pain).  Chest pain.  You have long-lasting (chronic) lung disease and any of the following:  Wheezing.  Long-lasting cough.  Coughing up blood.  A change in your usual mucus.  You have a stiff neck.  You have changes in your:  Vision.  Hearing.  Thinking.  Mood. This information is not intended to replace advice given to you by your health care provider. Make sure you discuss any questions you have with your health care provider. Document Released: 05/11/2008 Document Revised: 07/26/2016 Document Reviewed: 02/28/2014 Elsevier Interactive Patient Education  2017 Elsevier Inc.      Agustina Caroli, MD [facility]

## 2017-01-07 NOTE — Patient Instructions (Addendum)
     IF you received an x-ray today, you will receive an invoice from [facility]. Please contact [facility] at [phone] with questions or concerns regarding your invoice.   IF you received labwork today, you will receive an invoice from [facility]. Please contact [facility] at [phone] with questions or concerns regarding your invoice.   Our billing staff will not be able to assist you with questions regarding bills from these companies.  You will be contacted with the lab results as soon as they are available. The fastest way to get your results is to activate your My Chart account. Instructions are located on the last page of this paperwork. If you have not heard from us regarding the results in 2 weeks, please contact this office.      Upper Respiratory Infection, Adult Most upper respiratory infections (URIs) are caused by a virus. A URI affects the nose, throat, and upper air passages. The most common type of URI is often called "the common cold." Follow these instructions at home:  Take medicines only as told by your doctor.  Gargle warm saltwater or take cough drops to comfort your throat as told by your doctor.  Use a warm mist humidifier or inhale steam from a shower to increase air moisture. This may make it easier to breathe.  Drink enough fluid to keep your pee (urine) clear or pale yellow.  Eat soups and other clear broths.  Have a healthy diet.  Rest as needed.  Go back to work when your fever is gone or your doctor says it is okay.  You may need to stay home longer to avoid giving your URI to others.  You can also wear a face mask and wash your hands often to prevent spread of the virus.  Use your inhaler more if you have asthma.  Do not use any tobacco products, including cigarettes, chewing tobacco, or electronic cigarettes. If you need help quitting, ask your doctor. Contact a doctor if:  You are getting worse, not better.  Your  symptoms are not helped by medicine.  You have chills.  You are getting more short of breath.  You have brown or red mucus.  You have yellow or brown discharge from your nose.  You have pain in your face, especially when you bend forward.  You have a fever.  You have puffy (swollen) neck glands.  You have pain while swallowing.  You have white areas in the back of your throat. Get help right away if:  You have very bad or constant:  Headache.  Ear pain.  Pain in your forehead, behind your eyes, and over your cheekbones (sinus pain).  Chest pain.  You have long-lasting (chronic) lung disease and any of the following:  Wheezing.  Long-lasting cough.  Coughing up blood.  A change in your usual mucus.  You have a stiff neck.  You have changes in your:  Vision.  Hearing.  Thinking.  Mood. This information is not intended to replace advice given to you by your health care provider. Make sure you discuss any questions you have with your health care provider. Document Released: 05/11/2008 Document Revised: 07/26/2016 Document Reviewed: 02/28/2014 Elsevier Interactive Patient Education  2017 Elsevier Inc.  

## 2017-01-14 ENCOUNTER — Encounter: Payer: Self-pay | Admitting: Family Medicine

## 2017-01-20 ENCOUNTER — Encounter: Payer: Self-pay | Admitting: Allergy and Immunology

## 2017-01-20 ENCOUNTER — Encounter (INDEPENDENT_AMBULATORY_CARE_PROVIDER_SITE_OTHER): Payer: Self-pay

## 2017-01-20 ENCOUNTER — Ambulatory Visit (INDEPENDENT_AMBULATORY_CARE_PROVIDER_SITE_OTHER): Payer: Medicare Other | Admitting: Allergy and Immunology

## 2017-01-20 VITALS — BP 108/64 | HR 81 | Temp 97.9°F | Resp 18 | Ht 60.35 in | Wt 115.2 lb

## 2017-01-20 DIAGNOSIS — K219 Gastro-esophageal reflux disease without esophagitis: Secondary | ICD-10-CM | POA: Diagnosis not present

## 2017-01-20 DIAGNOSIS — J3089 Other allergic rhinitis: Secondary | ICD-10-CM

## 2017-01-20 DIAGNOSIS — Z8709 Personal history of other diseases of the respiratory system: Secondary | ICD-10-CM | POA: Diagnosis not present

## 2017-01-20 NOTE — Patient Instructions (Addendum)
  1. Nasal saline wash if needed  2. OTC antihistamine - Claritin/Zyrtec/Allegra - if needed   3. Mucinex DM - if needed  4. Continue therapy for reflux including nexium and apple cider vinegar  5. Contact clinic in future if problem

## 2017-01-20 NOTE — Progress Notes (Signed)
Dear Dr. Huey Bienenstock,  Thank you for referring Anne Mills to the [facility] on 01/20/2017.   Below is a summation of this patient's evaluation and recommendations.  Thank you for your referral. I will keep you informed about this patient's response to treatment.   If you have any questions please do not hesitate to contact me.   Sincerely,  Anne Prows, MD Allergy / Immunology [place]   ______________________________________________________________________    NEW PATIENT NOTE  Referring Provider: Garnette Czech, MD Primary Provider: Garnette Czech, MD Date of office visit: 01/20/2017    Subjective:   Chief Complaint:  Anne Mills (DOB: [birth year]) is a 71 y.o. female who presents to the clinic on 01/20/2017 with a chief complaint of Nasal Congestion .     HPI: Anne Mills presents to this clinic in evaluation of problems with her respiratory tract that developed January 2018. I had seen her back in this clinic many years ago with her last visit occurring in June 2014 in evaluation of intermittent asthma and allergic rhinoconjunctivitis and a history of reflux-induced respiratory disease.  She apparently did well for several years without any significant respiratory tract symptoms while using nasal saline spray and some occasional antihistamines without the need for using any short acting bronchodilator in greater than 4 years. However, this January she apparently contracted a upper respiratory tract infection that was associated with cough and she went to the urgent care center and visited with her doctor on several occasions and was treated with both Augmentin and 2 courses of azithromycin and was diagnosed with sinusitis and bronchitis. Fortunately, over the course of the past week or so she has resolved almost all of her respiratory tract symptoms and has a very slight  lingering cough that is improving every day. She does not have any recurrent fever or chills or ugly sputum production or ugly nasal discharge or anosmia or other respiratory tract symptoms.  She does believe that her reflux is under very good control at this point in time while using either Nexium or apple cider vinegar and being very careful about her diet and avoiding all caffeine consumption.  She did obtain the flu vaccine this year.  Past Medical History:  Diagnosis Date  . Asthma    no inhaler  . Back pain, chronic   . Chest pain    under left breast- 2-3 x per week: h/o breast surgery  . GERD (gastroesophageal reflux disease)   . Headache(784.0)   . Heart murmur    "not serious" per pt-no echo  . Scoliosis   . Seasonal allergies   . Shortness of breath    per pt due to allergies    Past Surgical History:  Procedure Laterality Date  . ABDOMINOPLASTY    . BREAST SURGERY     lt cyst  . COLONOSCOPY    . DILATION AND CURETTAGE OF UTERUS N/A 03/03/2013   Procedure: DILATATION AND CURETTAGE;  Surgeon: Lahoma Crocker, MD;  Location: [facility];  Service: Gynecology;  Laterality: N/A;  . EXAMINATION UNDER ANESTHESIA N/A 03/03/2013   Procedure: EXAM UNDER ANESTHESIA with ultrasound;  Surgeon: Lahoma Crocker, MD;  Location: [facility];  Service: Gynecology;  Laterality: N/A;  . TRIGGER FINGER RELEASE Left 06/21/2014   Procedure: LEFT THUMB TRIGGER RELEASE;  Surgeon: Tennis Must, MD;  Location: [facility];  Service: Orthopedics;  Laterality: Left;  Allergies as of 01/20/2017      Reactions   Gluten Meal Cough   Biaxin [clarithromycin] Nausea And Vomiting      Medication List      ASPERCREME EX Apply 1 application topically 2 (two) times daily as needed (for back pain).   benzonatate 100 MG capsule Commonly known as:  TESSALON Take 1-2 capsules (100-200 mg total) by mouth 3 (three) times daily as needed.   CALCIUM 600 + D PO Take 1 tablet by mouth  daily.   Cholecalciferol 2000 units Tabs Take 1 tablet by mouth daily.   Chromium Picolinate 200 MCG Tabs Take 1 tablet by mouth daily.   esomeprazole 20 MG capsule Commonly known as:  NEXIUM Take 20 mg by mouth daily at 12 noon.   FISH OIL OMEGA-3 PO Take by mouth.   FISH OIL PO Take by mouth.   gabapentin 300 MG capsule Commonly known as:  NEURONTIN Take 300 mg by mouth 3 (three) times daily.   ipratropium 0.03 % nasal spray Commonly known as:  ATROVENT Place 2 sprays into both nostrils 4 (four) times daily.   loratadine 10 MG tablet Commonly known as:  CLARITIN Take 10 mg by mouth daily.   meloxicam 7.5 MG tablet Commonly known as:  MOBIC Take 1 tablet (7.5 mg total) by mouth daily.   MENOPAUSE FORMULA PO Take 1 tablet by mouth 2 (two) times daily. Transitions for menopause   MUCINEX DM MAXIMUM STRENGTH 60-1200 MG Tb12 Take 1 tablet by mouth every 12 (twelve) hours.   multivitamin tablet Take 1 tablet by mouth daily.   OSTEO BI-FLEX TRIPLE STRENGTH Tabs Take 2 tablets by mouth daily.   OVER THE COUNTER MEDICATION Transitions w/Hesperidin vegetarian taking   OVER THE COUNTER MEDICATION Wal-Finate Dw/Pseudoephedrine taking   ROLAIDS MULTI-SYMPTOM PO Take 2 tablets by mouth at bedtime.   OCUVITE PO Take by mouth.   STRESS TAB NF Tabs Take 1 tablet by mouth daily.       Review of systems negative except as noted in HPI / PMHx or noted below:  Review of Systems  Constitutional: Negative.   HENT: Negative.   Eyes: Negative.   Respiratory: Negative.   Cardiovascular: Negative.   Gastrointestinal: Negative.   Genitourinary: Negative.   Musculoskeletal: Negative.   Skin: Negative.   Neurological: Negative.   Endo/Heme/Allergies: Negative.   Psychiatric/Behavioral: Negative.     Family History  Problem Relation Age of Onset  . Cancer Sister   . Diabetes Sister   . Heart disease Sister   . Hyperlipidemia Sister   . Hypertension Sister     . Mental retardation Sister     Social History   Social History  . Marital status: Married    Spouse name: N/A  . Number of children: N/A  . Years of education: N/A   Occupational History  . Not on file.   Social History Main Topics  . Smoking status: Never Smoker  . Smokeless tobacco: Never Used  . Alcohol use No  . Drug use: No  . Sexual activity: Not on file   Other Topics Concern  . Not on file   Social History Narrative  . No narrative on file    Environmental and Social history  Lives in a house with a dry environment no animals located inside the household, carpeting in the bedroom, no plastic on the bed or pillow, no smoking ongoing with inside the household.  Objective:   Vitals:   01/20/17 0947  BP: 108/64  Pulse: 81  Resp: 18  Temp: 97.9 F (36.6 C)   Height: 5' 0.35" (153.3 cm) Weight: 115 lb 3.2 oz (52.3 kg)  Physical Exam  Constitutional: She is well-developed, well-nourished, and in no distress.  HENT:  Head: Normocephalic. Head is without right periorbital erythema and without left periorbital erythema.  Right Ear: Tympanic membrane, external ear and ear canal normal.  Left Ear: Tympanic membrane, external ear and ear canal normal.  Nose: Nose normal. No mucosal edema or rhinorrhea.  Mouth/Throat: Uvula is midline, oropharynx is clear and moist and mucous membranes are normal. No oropharyngeal exudate.  Eyes: Conjunctivae and lids are normal. Pupils are equal, round, and reactive to light.  Neck: Trachea normal. No tracheal tenderness present. No tracheal deviation present. No thyromegaly present.  Cardiovascular: Normal rate, regular rhythm, S1 normal, S2 normal and normal heart sounds.   No murmur heard. Pulmonary/Chest: Effort normal and breath sounds normal. No stridor. No tachypnea. No respiratory distress. She has no wheezes. She has no rales. She exhibits no tenderness.  Abdominal: Soft. She exhibits no distension and no mass. There  is no hepatosplenomegaly. There is no tenderness. There is no rebound and no guarding.  Musculoskeletal: She exhibits no edema or tenderness.  Lymphadenopathy:       Head (right side): No tonsillar adenopathy present.       Head (left side): No tonsillar adenopathy present.    She has no cervical adenopathy.    She has no axillary adenopathy.  Neurological: She is alert. Gait normal.  Skin: No rash noted. She is not diaphoretic. No erythema. No pallor. Nails show no clubbing.  Psychiatric: Mood and affect normal.    Diagnostics: Allergy skin tests were not not performed.   Spirometry was performed and demonstrated an FEV1 of 1.64 @ 86 % of predicted.  Assessment and Plan:    1. Other allergic rhinitis   2. History of asthma   3. Gastroesophageal reflux disease, esophagitis presence not specified     1. Nasal saline wash if needed  2. OTC antihistamine - Claritin/Zyrtec/Allegra - if needed   3. Mucinex DM - if needed  4. Continue therapy for reflux including Nexium and apple cider vinegar  5. Contact clinic in future if problem  Anne Mills appears to have had a significant respiratory tract infection that was most likely viral in etiology but fortunately appears to have completely resolved with therapy administered over the course of the past several weeks and I do not think that she requires any further evaluation or treatment at this point in time for this event. In reviewing her respiratory status over the course of the past several years she has actually done quite well and other than utilizing the medical therapy mentioned above I'm not going to assign any other therapy or evaluation. She can contact me if she has significant problems as she moves forward with this plan. Certainly if she has recurrent respiratory tract symptoms she'll require further evaluation and treatment.  Anne Prows, MD [facility]

## 2017-04-07 ENCOUNTER — Other Ambulatory Visit: Payer: Self-pay | Admitting: Family Medicine

## 2017-04-07 DIAGNOSIS — R5381 Other malaise: Secondary | ICD-10-CM

## 2017-06-23 ENCOUNTER — Ambulatory Visit: Payer: Self-pay | Admitting: Emergency Medicine

## 2017-06-23 ENCOUNTER — Other Ambulatory Visit: Payer: Self-pay | Admitting: Family Medicine

## 2017-06-23 DIAGNOSIS — E2839 Other primary ovarian failure: Secondary | ICD-10-CM

## 2017-07-01 ENCOUNTER — Ambulatory Visit (INDEPENDENT_AMBULATORY_CARE_PROVIDER_SITE_OTHER): Payer: Medicare Other | Admitting: Family Medicine

## 2017-07-01 ENCOUNTER — Encounter: Payer: Self-pay | Admitting: Family Medicine

## 2017-07-01 VITALS — BP 113/65 | HR 82 | Temp 98.6°F | Resp 16 | Ht 60.0 in | Wt 116.6 lb

## 2017-07-01 DIAGNOSIS — L603 Nail dystrophy: Secondary | ICD-10-CM

## 2017-07-01 DIAGNOSIS — R21 Rash and other nonspecific skin eruption: Secondary | ICD-10-CM | POA: Diagnosis not present

## 2017-07-01 DIAGNOSIS — Z1159 Encounter for screening for other viral diseases: Secondary | ICD-10-CM | POA: Diagnosis not present

## 2017-07-01 MED ORDER — KETOCONAZOLE 2 % EX CREA: 1.0000 "application " | TOPICAL_CREAM | Freq: Two times a day (BID) | CUTANEOUS | 0 refills | Status: DC

## 2017-07-01 NOTE — Patient Instructions (Signed)
     IF you received an x-ray today, you will receive an invoice from [facility]. Please contact [facility] at [phone] with questions or concerns regarding your invoice.   IF you received labwork today, you will receive an invoice from [facility]. Please contact [facility] at [phone] with questions or concerns regarding your invoice.   Our billing staff will not be able to assist you with questions regarding bills from these companies.  You will be contacted with the lab results as soon as they are available. The fastest way to get your results is to activate your My Chart account. Instructions are located on the last page of this paperwork. If you have not heard from us regarding the results in 2 weeks, please contact this office.     

## 2017-07-01 NOTE — Progress Notes (Signed)
Subjective:    Patient ID: Anne Mills, female    DOB: 02-05-1946, 71 y.o.   MRN: [id number]  07/01/2017  Hand Pain (right thumb pain, onset: beginning of June, pain level 4/10, per pt has taken naproxen for pain and pt takes gabapentin also for pain)   HPI This 71 y.o. female presents for evaluation of R THUMB PAIN.  Onset two months ago.  No injury.  Severity 4/10.  No n/t/w.  No fever/chills/sweats. No redness; no swelling; no streaking.  Nails are a bit thickened and discolored on hand also.  Immunization History  Administered Date(s) Administered  . Influenza,inj,Quad PF,36+ Mos 08/22/2014  . Influenza-Unspecified 10/06/2016  . Pneumococcal Polysaccharide-23 07/24/2015  . Td 07/24/2015   BP Readings from Last 3 Encounters:  07/01/17 113/65  01/20/17 108/64  01/07/17 108/60   Wt Readings from Last 3 Encounters:  07/01/17 116 lb 9.6 oz (52.9 kg)  01/20/17 115 lb 3.2 oz (52.3 kg)  01/07/17 111 lb 9.6 oz (50.6 kg)    Review of Systems  Constitutional: Negative for chills, diaphoresis, fatigue and fever.  HENT: Negative for trouble swallowing.   Musculoskeletal: Positive for arthralgias.  Skin: Positive for color change. Negative for pallor, rash and wound.  Neurological: Negative for weakness and numbness.    Past Medical History:  Diagnosis Date  . Asthma    no inhaler  . Back pain, chronic   . Chest pain    under left breast- 2-3 x per week: h/o breast surgery  . GERD (gastroesophageal reflux disease)   . Headache(784.0)   . Heart murmur    "not serious" per pt-no echo  . Scoliosis   . Seasonal allergies   . Shortness of breath    per pt due to allergies   Past Surgical History:  Procedure Laterality Date  . ABDOMINOPLASTY    . BREAST SURGERY     lt cyst  . COLONOSCOPY    . DILATION AND CURETTAGE OF UTERUS N/A 03/03/2013   Procedure: DILATATION AND CURETTAGE;  Surgeon: Lahoma Crocker, MD;  Location: [facility];  Service: Gynecology;  Laterality:  N/A;  . EXAMINATION UNDER ANESTHESIA N/A 03/03/2013   Procedure: EXAM UNDER ANESTHESIA with ultrasound;  Surgeon: Lahoma Crocker, MD;  Location: [facility];  Service: Gynecology;  Laterality: N/A;  . TRIGGER FINGER RELEASE Left 06/21/2014   Procedure: LEFT THUMB TRIGGER RELEASE;  Surgeon: Tennis Must, MD;  Location: [facility];  Service: Orthopedics;  Laterality: Left;   Allergies  Allergen Reactions  . Gluten Meal Cough  . Biaxin [Clarithromycin] Nausea And Vomiting   Current Outpatient Prescriptions  Medication Sig Dispense Refill  . B Complex Vitamins (VITAMIN B-COMPLEX PO) Take by mouth. Liquid    . Calcium Carbonate-Vitamin D (CALCIUM 600 + D PO) Take 1 tablet by mouth daily.    Marland Kitchen CALCIUM PO Take 1,200 mg by mouth.    . Camphor-Menthol-Methyl Sal 1.2-5.7-6.3 % PTCH Apply topically.    . gabapentin (NEURONTIN) 300 MG capsule Take 300 mg by mouth 3 (three) times daily.    Marland Kitchen loratadine (CLARITIN) 10 MG tablet Take 10 mg by mouth daily.    . Magnesium 500 MG CAPS Take by mouth.    . Misc Natural Products (OSTEO BI-FLEX TRIPLE STRENGTH) TABS Take 2 tablets by mouth daily.    Marland Kitchen NAPROXEN PO Take by mouth as needed.    . Omega-3 Fatty Acids (FISH OIL PO) Take by mouth.    Marland Kitchen OVER THE COUNTER  MEDICATION Transitions w/Hesperidin vegetarian taking    . OVER THE COUNTER MEDICATION Wal-Finate Dw/Pseudoephedrine taking    . Probiotic Product (PROBIOTIC PO) Take by mouth. 10 Billion Active Cultures    . Trolamine Salicylate (ASPERCREME EX) Apply 1 application topically 2 (two) times daily as needed (for back pain).    . Cholecalciferol 2000 UNITS TABS Take 1 tablet by mouth daily.    Marland Kitchen esomeprazole (NEXIUM) 20 MG capsule Take 20 mg by mouth daily at 12 noon.    Marland Kitchen EVENING PRIMROSE OIL PO Take 1,300 mg by mouth.    Marland Kitchen GELATIN PO Take 1,300 mg by mouth.    Marland Kitchen ipratropium (ATROVENT) 0.03 % nasal spray Place 2 sprays into both nostrils 4 (four) times daily. (Patient not taking: Reported  on 07/01/2017) 30 mL 0  . ketoconazole (NIZORAL) 2 % cream Apply 1 application topically 2 (two) times daily. 30 g 0  . Multiple Vitamins-Minerals (STRESS TAB NF) TABS Take 1 tablet by mouth daily.     No current facility-administered medications for this visit.    Social History   Social History  . Marital status: Married    Spouse name: N/A  . Number of children: N/A  . Years of education: N/A   Occupational History  . Not on file.   Social History Main Topics  . Smoking status: Never Smoker  . Smokeless tobacco: Never Used  . Alcohol use No  . Drug use: No  . Sexual activity: Not on file   Other Topics Concern  . Not on file   Social History Narrative  . No narrative on file   Family History  Problem Relation Age of Onset  . Cancer Sister   . Diabetes Sister   . Heart disease Sister   . Hyperlipidemia Sister   . Hypertension Sister   . Mental retardation Sister        Objective:    BP 113/65 (BP Location: Right Arm, Patient Position: Sitting, Cuff Size: Normal)   Pulse 82   Temp 98.6 F (37 C) (Oral)   Resp 16   Ht 5' (1.524 m)   Wt 116 lb 9.6 oz (52.9 kg)   SpO2 99%   BMI 22.77 kg/m  Physical Exam  Constitutional: She is oriented to person, place, and time. She appears well-developed and well-nourished. No distress.  HENT:  Head: Normocephalic and atraumatic.  Eyes: Pupils are equal, round, and reactive to light. Conjunctivae are normal.  Neck: Normal range of motion. Neck supple.  Cardiovascular: Normal rate, regular rhythm and normal heart sounds.  Exam reveals no gallop and no friction rub.   No murmur heard. Pulmonary/Chest: Effort normal and breath sounds normal. She has no wheezes. She has no rales.  Musculoskeletal:  R thumb with full flexion and extension; no swelling.  Neurological: She is alert and oriented to person, place, and time.  Skin: She is not diaphoretic.  R thumb with scaling rash surrounding nail bed with minimal erythema;  nails dystrophic and hypertrophied.  Psychiatric: She has a normal mood and affect. Her behavior is normal.  Nursing note and vitals reviewed.       Assessment & Plan:   1. Rash and nonspecific skin eruption   2. Dystrophic nail   3. Need for hepatitis C screening test    -new rash on R thumb.  -send labs including fungal cx. -rx for ketoconazole cream to rash surrounding nail bed while awaiting fungal cx.  -obtain age appropriate screening labs for  hepatitis c in a baby boomer.  Orders Placed This Encounter  Procedures  . Fungus culture w smear  . Comprehensive metabolic panel  . Hepatitis C antibody   Meds ordered this encounter  Medications  . ketoconazole (NIZORAL) 2 % cream    Sig: Apply 1 application topically 2 (two) times daily.    Dispense:  30 g    Refill:  0    No Follow-up on file.   Calia Napp Elayne Guerin, M.D. Primary Care at [facility] previously [facility] [address] [place], [place]  [address] [phone] phone [phone] fax

## 2017-07-02 LAB — COMPREHENSIVE METABOLIC PANEL
ALT: 24 IU/L (ref 0–32)
AST: 28 IU/L (ref 0–40)
Albumin/Globulin Ratio: 1.7 (ref 1.2–2.2)
Albumin: 4.5 g/dL (ref 3.5–4.8)
Alkaline Phosphatase: 101 IU/L (ref 39–117)
BUN / CREAT RATIO: 30 — AB (ref 12–28)
BUN: 19 mg/dL (ref 8–27)
Bilirubin Total: 0.5 mg/dL (ref 0.0–1.2)
CHLORIDE: 102 mmol/L (ref 96–106)
CO2: 28 mmol/L (ref 20–29)
Calcium: 9.4 mg/dL (ref 8.7–10.3)
Creatinine, Ser: 0.64 mg/dL (ref 0.57–1.00)
GFR calc Af Amer: 105 mL/min/{1.73_m2} (ref >59)
GFR calc non Af Amer: 91 mL/min/{1.73_m2} (ref >59)
GLOBULIN, TOTAL: 2.7 g/dL (ref 1.5–4.5)
Glucose: 83 mg/dL (ref 65–99)
Potassium: 4.4 mmol/L (ref 3.5–5.2)
Sodium: 142 mmol/L (ref 134–144)
Total Protein: 7.2 g/dL (ref 6.0–8.5)

## 2017-07-02 LAB — HEPATITIS C ANTIBODY: Hep C Virus Ab: 0.1 s/co ratio (ref 0.0–0.9)

## 2017-07-21 ENCOUNTER — Encounter: Payer: Self-pay | Admitting: Family Medicine

## 2017-07-21 ENCOUNTER — Ambulatory Visit (INDEPENDENT_AMBULATORY_CARE_PROVIDER_SITE_OTHER): Payer: Medicare Other | Admitting: Family Medicine

## 2017-07-21 VITALS — BP 107/63 | HR 84 | Temp 98.3°F | Resp 18 | Ht 60.63 in | Wt 118.0 lb

## 2017-07-21 DIAGNOSIS — B351 Tinea unguium: Secondary | ICD-10-CM | POA: Diagnosis not present

## 2017-07-21 DIAGNOSIS — L03011 Cellulitis of right finger: Secondary | ICD-10-CM | POA: Diagnosis not present

## 2017-07-21 MED ORDER — MUPIROCIN 2 % EX OINT: 1.0000 "application " | TOPICAL_OINTMENT | Freq: Every day | CUTANEOUS | 0 refills | Status: DC

## 2017-07-21 MED ORDER — TERBINAFINE HCL 250 MG PO TABS: 250.0000 mg | ORAL_TABLET | Freq: Every day | ORAL | 1 refills | Status: DC

## 2017-07-21 NOTE — Progress Notes (Signed)
Subjective:    Patient ID: Anne Mills, female    DOB: October 23, 1946, 71 y.o.   MRN: [id number]  07/21/2017  Hand Pain ( floow-up with thumb on the right hand still is swollen and painful )   HPI This 71 y.o. female presents for evaluation of worsening thumb rash RIGHT.  Prescribed Ketoconazole at last visit on 07/01/17; now having swelling along lateral edge of nail; also now tender.  No fever/chills/sweats.     BP Readings from Last 3 Encounters:  07/21/17 107/63  07/01/17 113/65  01/20/17 108/64   Wt Readings from Last 3 Encounters:  07/21/17 118 lb (53.5 kg)  07/01/17 116 lb 9.6 oz (52.9 kg)  01/20/17 115 lb 3.2 oz (52.3 kg)   Immunization History  Administered Date(s) Administered  . Influenza,inj,Quad PF,6+ Mos 08/22/2014  . Influenza-Unspecified 10/06/2016  . Pneumococcal Polysaccharide-23 07/24/2015  . Td 07/24/2015    Review of Systems  Constitutional: Negative for chills, diaphoresis and fatigue.  Skin: Positive for color change, rash and wound.    Past Medical History:  Diagnosis Date  . Asthma    no inhaler  . Back pain, chronic   . Chest pain    under left breast- 2-3 x per week: h/o breast surgery  . GERD (gastroesophageal reflux disease)   . Headache(784.0)   . Heart murmur    "not serious" per pt-no echo  . Scoliosis   . Seasonal allergies   . Shortness of breath    per pt due to allergies   Past Surgical History:  Procedure Laterality Date  . ABDOMINOPLASTY    . BREAST SURGERY     lt cyst  . COLONOSCOPY    . DILATION AND CURETTAGE OF UTERUS N/A 03/03/2013   Procedure: DILATATION AND CURETTAGE;  Surgeon: Lahoma Crocker, MD;  Location: [facility];  Service: Gynecology;  Laterality: N/A;  . EXAMINATION UNDER ANESTHESIA N/A 03/03/2013   Procedure: EXAM UNDER ANESTHESIA with ultrasound;  Surgeon: Lahoma Crocker, MD;  Location: [facility];  Service: Gynecology;  Laterality: N/A;  . TRIGGER FINGER RELEASE Left 06/21/2014   Procedure: LEFT THUMB  TRIGGER RELEASE;  Surgeon: Tennis Must, MD;  Location: [facility];  Service: Orthopedics;  Laterality: Left;   Allergies  Allergen Reactions  . Gluten Meal Cough  . Biaxin [Clarithromycin] Nausea And Vomiting    Social History   Social History  . Marital status: Married    Spouse name: N/A  . Number of children: N/A  . Years of education: N/A   Occupational History  . Not on file.   Social History Main Topics  . Smoking status: Never Smoker  . Smokeless tobacco: Never Used  . Alcohol use No  . Drug use: No  . Sexual activity: Not on file   Other Topics Concern  . Not on file   Social History Narrative  . No narrative on file   Family History  Problem Relation Age of Onset  . Cancer Sister   . Diabetes Sister   . Heart disease Sister   . Hyperlipidemia Sister   . Hypertension Sister   . Mental retardation Sister        Objective:    BP 107/63   Pulse 84   Temp 98.3 F (36.8 C) (Oral)   Resp 18   Ht 5' 0.63" (1.54 m)   Wt 118 lb (53.5 kg)   SpO2 98%   BMI 22.57 kg/m  Physical Exam  Constitutional: She appears well-developed  and well-nourished. No distress.  Skin: Rash noted. She is not diaphoretic.  R THUMB: nail hypertrophied with slight darkening with scaling of nail. Lateral nailbed skin with swelling, warmth, TTP, minimal fluctuance.   Psychiatric: She has a normal mood and affect. Her behavior is normal.   Results for orders placed or performed in visit on 07/01/17  Fungus culture w smear  Result Value Ref Range   Fungus Stain Final report    Result 1 Comment    Fungus (Mycology) Culture Final report    Fungal result 1 Comment   Comprehensive metabolic panel  Result Value Ref Range   Glucose 83 65 - 99 mg/dL   BUN 19 8 - 27 mg/dL   Creatinine, Ser 0.64 0.57 - 1.00 mg/dL   GFR calc non Af Amer 91 >59 mL/min/1.73   GFR calc Af Amer 105 >59 mL/min/1.73   BUN/Creatinine Ratio 30 (H) 12 - 28   Sodium 142 134 - 144 mmol/L    Potassium 4.4 3.5 - 5.2 mmol/L   Chloride 102 96 - 106 mmol/L   CO2 28 20 - 29 mmol/L   Calcium 9.4 8.7 - 10.3 mg/dL   Total Protein 7.2 6.0 - 8.5 g/dL   Albumin 4.5 3.5 - 4.8 g/dL   Globulin, Total 2.7 1.5 - 4.5 g/dL   Albumin/Globulin Ratio 1.7 1.2 - 2.2   Bilirubin Total 0.5 0.0 - 1.2 mg/dL   Alkaline Phosphatase 101 39 - 117 IU/L   AST 28 0 - 40 IU/L   ALT 24 0 - 32 IU/L  Hepatitis C antibody  Result Value Ref Range   Hep C Virus Ab 0.1 0.0 - 0.9 s/co ratio   No results found. Depression screen [facility] 2/9 07/21/2017 07/01/2017 01/07/2017 12/22/2016 12/17/2016  Decreased Interest 0 0 0 0 0  Down, Depressed, Hopeless 0 0 0 0 0  PHQ - 2 Score 0 0 0 0 0   Fall Risk  07/21/2017 07/01/2017 01/07/2017 12/22/2016 12/17/2016  Falls in the past year? No No No No No   PROCEDURE: VERBAL CONSENT OBTAINED; AREA CLEANSED WITH ALCOHOL SWAB; STERILE 18 GAUGE NEEDLE ADVANCED INTO LATERAL ASPECT OF NAILBED SKIN; NO DRAINAGE EXPRESSED.      Assessment & Plan:   1. Paronychia of right thumb   2. Onychomycosis    -New onset; fungal cx still pending from last visit. -rx for Lamisil 250mg  daily provided. -rx for Bactroban provided to apply to finger. -keep finger covered during day but remove bandage at night and then apply Bactroban qhs.  No Bactroban in morning with bandage. -RTC for acute worsening.  No orders of the defined types were placed in this encounter.  Meds ordered this encounter  Medications  . DISCONTD: terbinafine (LAMISIL) 250 MG tablet    Sig: Take 1 tablet (250 mg total) by mouth daily.    Dispense:  30 tablet    Refill:  1  . mupirocin ointment (BACTROBAN) 2 %    Sig: Apply 1 application topically at bedtime.    Dispense:  30 g    Refill:  0  . terbinafine (LAMISIL) 250 MG tablet    Sig: Take 1 tablet (250 mg total) by mouth daily.    Dispense:  30 tablet    Refill:  1    Return in about 4 weeks (around 08/18/2017) for recheck.   Kristi Elayne Guerin, M.D. Primary Care at  [facility] previously [facility] [place], [place]  [address] [phone] phone [phone] fax

## 2017-07-21 NOTE — Patient Instructions (Addendum)
  SOAK YOUR FINGER IN WARM WATER WITH PEROXIDE FOR 15 MINUTES EACH NIGHT. THEN APPLY PRESCRIPTION ANTIBIOTIC CREAM. TAKE ANTIFUNGAL TABLET ONE DAILY FOR 6-8 WEEKS.   IF you received an x-ray today, you will receive an invoice from [facility]. Please contact [facility] at [phone] with questions or concerns regarding your invoice.   IF you received labwork today, you will receive an invoice from [facility]. Please contact [facility] at [phone] with questions or concerns regarding your invoice.   Our billing staff will not be able to assist you with questions regarding bills from these companies.  You will be contacted with the lab results as soon as they are available. The fastest way to get your results is to activate your My Chart account. Instructions are located on the last page of this paperwork. If you have not heard from [place] regarding the results in 2 weeks, please contact this office.

## 2017-07-22 LAB — FUNGUS CULTURE W SMEAR

## 2017-08-18 ENCOUNTER — Ambulatory Visit: Payer: Self-pay | Admitting: Family Medicine

## 2017-09-15 ENCOUNTER — Ambulatory Visit (INDEPENDENT_AMBULATORY_CARE_PROVIDER_SITE_OTHER): Payer: Medicare Other | Admitting: Family Medicine

## 2017-09-15 ENCOUNTER — Ambulatory Visit (INDEPENDENT_AMBULATORY_CARE_PROVIDER_SITE_OTHER): Payer: Medicare Other

## 2017-09-15 ENCOUNTER — Encounter: Payer: Self-pay | Admitting: Family Medicine

## 2017-09-15 VITALS — BP 110/78 | HR 70 | Temp 98.0°F | Resp 16 | Ht 61.81 in | Wt 121.0 lb

## 2017-09-15 DIAGNOSIS — S93491A Sprain of other ligament of right ankle, initial encounter: Secondary | ICD-10-CM

## 2017-09-15 DIAGNOSIS — M4120 Other idiopathic scoliosis, site unspecified: Secondary | ICD-10-CM | POA: Insufficient documentation

## 2017-09-15 DIAGNOSIS — M25571 Pain in right ankle and joints of right foot: Secondary | ICD-10-CM | POA: Diagnosis not present

## 2017-09-15 DIAGNOSIS — Z23 Encounter for immunization: Secondary | ICD-10-CM | POA: Diagnosis not present

## 2017-09-15 DIAGNOSIS — L03011 Cellulitis of right finger: Secondary | ICD-10-CM | POA: Diagnosis not present

## 2017-09-15 DIAGNOSIS — L603 Nail dystrophy: Secondary | ICD-10-CM

## 2017-09-15 MED ORDER — ZOSTER VAC RECOMB ADJUVANTED 50 MCG/0.5ML IM SUSR: 0.5000 mL | Freq: Once | INTRAMUSCULAR | 1 refills | Status: AC

## 2017-09-15 NOTE — Progress Notes (Signed)
Subjective:    Patient ID: Anne Mills, female    DOB: 1946/08/18, 71 y.o.   MRN: [id number]  09/15/2017  Right Thumb (follow-up on thumb pain, pt states she stopped taking the mediaction prescribed because it made her sick. )    HPI This 71 y.o. female presents for evaluation of PARONYCHIA and ONYCHOMYCOSIS of RIGHT thumb.  Management at last visit included:   -New onset; fungal cx still pending from last visit. -rx for Lamisil 250mg  daily provided. -rx for Bactroban provided to apply to finger. -keep finger covered during day but remove bandage at night and then apply Bactroban qhs.  No Bactroban in morning with bandage. -RTC for acute worsening  Fungal cx negative.   Only took one dose of Lamisil because suffered side effects.  Then left for [facility].  Cannot be sick.  Thumb looks better.  Swelling has resolved.  No drainage.  Nail still with changes.  Allergic rhinitis: takes Arovent qhs. Claritin.  Scoliosis with upper back pain and lower back pain: maintained on Gabapentin.  Prescribed by [facility] [place].    Ankle pain: twisted ankle one week ago; suffered significant swelling.  Continues to have swelling and pain.  Concerned about fracture. Denies numbness or tingling.  Wearing a supportive tennis shoe.   BP Readings from Last 3 Encounters:  09/15/17 110/78  07/21/17 107/63  07/01/17 113/65   Wt Readings from Last 3 Encounters:  09/15/17 121 lb (54.9 kg)  07/21/17 118 lb (53.5 kg)  07/01/17 116 lb 9.6 oz (52.9 kg)   Immunization History  Administered Date(s) Administered  . Influenza,inj,Quad PF,6+ Mos 08/22/2014, 09/15/2017  . Influenza-Unspecified 10/06/2016  . Pneumococcal Polysaccharide-23 07/24/2015, 09/15/2017  . Td 07/24/2015    Review of Systems  Constitutional: Negative for chills, diaphoresis, fatigue and fever.  HENT: Negative for ear pain, postnasal drip, rhinorrhea, sinus pressure, sore throat and trouble swallowing.     Respiratory: Negative for cough and shortness of breath.   Cardiovascular: Negative for chest pain, palpitations and leg swelling.  Gastrointestinal: Negative for abdominal pain, constipation, diarrhea, nausea and vomiting.  Musculoskeletal: Positive for arthralgias, back pain, gait problem and joint swelling.  Skin: Positive for color change. Negative for pallor, rash and wound.  Neurological: Positive for numbness.    Past Medical History:  Diagnosis Date  . Asthma    no inhaler  . Back pain, chronic   . Chest pain    under left breast- 2-3 x per week: h/o breast surgery  . GERD (gastroesophageal reflux disease)   . Headache(784.0)   . Heart murmur    "not serious" per pt-no echo  . Scoliosis   . Seasonal allergies   . Shortness of breath    per pt due to allergies   Past Surgical History:  Procedure Laterality Date  . ABDOMINOPLASTY    . BREAST SURGERY     lt cyst  . COLONOSCOPY    . DILATION AND CURETTAGE OF UTERUS N/A 03/03/2013   Procedure: DILATATION AND CURETTAGE;  Surgeon: Lahoma Crocker, MD;  Location: [facility];  Service: Gynecology;  Laterality: N/A;  . EXAMINATION UNDER ANESTHESIA N/A 03/03/2013   Procedure: EXAM UNDER ANESTHESIA with ultrasound;  Surgeon: Lahoma Crocker, MD;  Location: [facility];  Service: Gynecology;  Laterality: N/A;  . TRIGGER FINGER RELEASE Left 06/21/2014   Procedure: LEFT THUMB TRIGGER RELEASE;  Surgeon: Tennis Must, MD;  Location: [facility];  Service: Orthopedics;  Laterality: Left;  Allergies  Allergen Reactions  . Gluten Meal Cough  . Biaxin [Clarithromycin] Nausea And Vomiting   Current Outpatient Prescriptions on File Prior to Visit  Medication Sig Dispense Refill  . B Complex Vitamins (VITAMIN B-COMPLEX PO) Take by mouth. Liquid    . Calcium Carbonate-Vitamin D (CALCIUM 600 + D PO) Take 1 tablet by mouth daily.    Marland Kitchen CALCIUM PO Take 1,200 mg by mouth.    . Camphor-Menthol-Methyl Sal 1.2-5.7-6.3 % PTCH Apply  topically.    . Cholecalciferol 2000 UNITS TABS Take 1 tablet by mouth daily.    Marland Kitchen EVENING PRIMROSE OIL PO Take 1,300 mg by mouth.    . gabapentin (NEURONTIN) 300 MG capsule Take 300 mg by mouth 3 (three) times daily.    Marland Kitchen GELATIN PO Take 1,300 mg by mouth.    Marland Kitchen ipratropium (ATROVENT) 0.03 % nasal spray Place 2 sprays into both nostrils 4 (four) times daily. 30 mL 0  . ketoconazole (NIZORAL) 2 % cream Apply 1 application topically 2 (two) times daily. 30 g 0  . loratadine (CLARITIN) 10 MG tablet Take 10 mg by mouth daily.    . Magnesium 500 MG CAPS Take by mouth.    . Misc Natural Products (OSTEO BI-FLEX TRIPLE STRENGTH) TABS Take 2 tablets by mouth daily.    . Multiple Vitamins-Minerals (STRESS TAB NF) TABS Take 1 tablet by mouth daily.    Marland Kitchen NAPROXEN PO Take by mouth as needed.    . Omega-3 Fatty Acids (FISH OIL PO) Take by mouth.    Marland Kitchen OVER THE COUNTER MEDICATION Transitions w/Hesperidin vegetarian taking    . OVER THE COUNTER MEDICATION Wal-Finate Dw/Pseudoephedrine taking    . Probiotic Product (PROBIOTIC PO) Take by mouth. 10 Billion Active Cultures    . Trolamine Salicylate (ASPERCREME EX) Apply 1 application topically 2 (two) times daily as needed (for back pain).     No current facility-administered medications on file prior to visit.    Social History   Social History  . Marital status: Married    Spouse name: N/A  . Number of children: N/A  . Years of education: N/A   Occupational History  . Not on file.   Social History Main Topics  . Smoking status: Never Smoker  . Smokeless tobacco: Never Used  . Alcohol use No  . Drug use: No  . Sexual activity: Not on file   Other Topics Concern  . Not on file   Social History Narrative  . No narrative on file   Family History  Problem Relation Age of Onset  . Cancer Sister   . Diabetes Sister   . Heart disease Sister   . Hyperlipidemia Sister   . Hypertension Sister   . Mental retardation Sister        Objective:      BP 110/78   Pulse 70   Temp 98 F (36.7 C) (Oral)   Resp 16   Ht 5' 1.81" (1.57 m)   Wt 121 lb (54.9 kg)   SpO2 95%   BMI 22.27 kg/m  Physical Exam  Constitutional: She is oriented to person, place, and time. She appears well-developed and well-nourished. No distress.  HENT:  Head: Normocephalic and atraumatic.  Eyes: Pupils are equal, round, and reactive to light. Conjunctivae are normal.  Neck: Normal range of motion. Neck supple.  Cardiovascular: Normal rate, regular rhythm and normal heart sounds.  Exam reveals no gallop and no friction rub.   No murmur heard. Pulmonary/Chest:  Effort normal and breath sounds normal. She has no wheezes. She has no rales.  Musculoskeletal:       Right ankle: She exhibits swelling. She exhibits normal range of motion, no ecchymosis, no deformity, no laceration and normal pulse. Tenderness. Lateral malleolus tenderness found. No medial malleolus, no AITFL, no CF ligament, no posterior TFL, no head of 5th metatarsal and no proximal fibula tenderness found. Achilles tendon normal.       Right lower leg: Normal. She exhibits no tenderness, no bony tenderness and no swelling.       Right foot: Normal. There is normal range of motion, no tenderness, no bony tenderness, no swelling, normal capillary refill and no crepitus.  Neurological: She is alert and oriented to person, place, and time.  Skin: Skin is warm and dry. No rash noted. She is not diaphoretic. No erythema. No pallor.  RIGHT thumb:  No swelling of skin surrounding nail; +mild scaling of skin; no erythema; non-tender to palpation.  R thumb nail with ridges and color changes.  Non-tender.  Psychiatric: She has a normal mood and affect. Her behavior is normal.  Nursing note and vitals reviewed.  No results found. Depression screen [facility] 2/9 09/15/2017 07/21/2017 07/01/2017 01/07/2017 12/22/2016  Decreased Interest 0 0 0 0 0  Down, Depressed, Hopeless 0 0 0 0 0  PHQ - 2 Score 0 0 0 0 0   Fall Risk   09/15/2017 07/21/2017 07/01/2017 01/07/2017 12/22/2016  Falls in the past year? No No No No No        Assessment & Plan:   1. Acute right ankle pain   2. Paronychia of right thumb   3. Dystrophic nail   4. Scoliosis (and kyphoscoliosis), idiopathic   5. Sprain of anterior talofibular ligament of right ankle, initial encounter   6. Need for prophylactic vaccination and inoculation against influenza   7. Need for prophylactic vaccination against Streptococcus pneumoniae (pneumococcus)   8. Need for shingles vaccine     -New onset R lateral ankle sprain; recommend rest, icing, supportive tennis shoes.   -paronychia of R thumb resolved; persistent R thumb dystrophic nail; intolerant to Lamisil; supportive care; fungal cx negative.  No further treatment at this time. -maintained on Gabapentin for chronic back pain related to scoliosis.   Orders Placed This Encounter  Procedures  . DG Ankle Complete Right    Standing Status:   Future    Number of Occurrences:   1    Standing Expiration Date:   09/15/2018    Order Specific Question:   Reason for Exam (SYMPTOM  OR DIAGNOSIS REQUIRED)    Answer:   trauma to lateral malleolus three weeks ago; pain along fifth metatarsal    Order Specific Question:   Preferred imaging location?    Answer:   External  . DG Foot Complete Right    Standing Status:   Future    Number of Occurrences:   1    Standing Expiration Date:   09/15/2018    Order Specific Question:   Reason for Exam (SYMPTOM  OR DIAGNOSIS REQUIRED)    Answer:   trauma to lateral malleolus three weeks ago; pain along fifth metatarsal    Order Specific Question:   Preferred imaging location?    Answer:   External  . Flu Vaccine QUAD 36+ mos IM  . Pneumococcal polysaccharide vaccine 23-valent greater than or equal to 2yo subcutaneous/IM   Meds ordered this encounter  Medications  . Zoster Vac Recomb  Adjuvanted [facility]) injection    Sig: Inject 0.5 mLs into the muscle once.     Dispense:  0.5 mL    Refill:  1    No Follow-up on file.   Murad Staples Elayne Guerin, M.D. [facility] [address] [place], [place]  [phone] phone [phone] fax

## 2017-09-15 NOTE — Patient Instructions (Signed)
     IF you received an x-ray today, you will receive an invoice from [facility]. Please contact [facility] at [phone] with questions or concerns regarding your invoice.   IF you received labwork today, you will receive an invoice from [facility]. Please contact [facility] at [phone] with questions or concerns regarding your invoice.   Our billing staff will not be able to assist you with questions regarding bills from these companies.  You will be contacted with the lab results as soon as they are available. The fastest way to get your results is to activate your My Chart account. Instructions are located on the last page of this paperwork. If you have not heard from us regarding the results in 2 weeks, please contact this office.     

## 2017-10-01 ENCOUNTER — Telehealth: Payer: Self-pay | Admitting: Family Medicine

## 2017-12-13 ENCOUNTER — Encounter: Payer: Self-pay | Admitting: Family Medicine

## 2017-12-13 ENCOUNTER — Ambulatory Visit: Payer: Medicare Other | Admitting: Family Medicine

## 2017-12-13 VITALS — BP 118/66 | HR 88 | Resp 16

## 2017-12-13 DIAGNOSIS — H1010 Acute atopic conjunctivitis, unspecified eye: Secondary | ICD-10-CM | POA: Diagnosis not present

## 2017-12-13 DIAGNOSIS — Z8709 Personal history of other diseases of the respiratory system: Secondary | ICD-10-CM

## 2017-12-13 DIAGNOSIS — J3089 Other allergic rhinitis: Secondary | ICD-10-CM | POA: Insufficient documentation

## 2017-12-13 DIAGNOSIS — J45909 Unspecified asthma, uncomplicated: Secondary | ICD-10-CM | POA: Insufficient documentation

## 2017-12-13 DIAGNOSIS — K219 Gastro-esophageal reflux disease without esophagitis: Secondary | ICD-10-CM

## 2017-12-13 MED ORDER — LEVOCETIRIZINE DIHYDROCHLORIDE 5 MG PO TABS: 5.0000 mg | ORAL_TABLET | Freq: Every evening | ORAL | 5 refills | Status: DC

## 2017-12-13 MED ORDER — AZELASTINE-FLUTICASONE 137-50 MCG/ACT NA SUSP: 1.0000 | Freq: Two times a day (BID) | NASAL | 5 refills | Status: DC

## 2017-12-13 MED ORDER — MONTELUKAST SODIUM 10 MG PO TABS: 10.0000 mg | ORAL_TABLET | Freq: Every day | ORAL | 5 refills | Status: DC

## 2017-12-13 NOTE — Patient Instructions (Addendum)
Allergic rhinitis/conjunctivitis - Make an appointment for allergy skin testing - Begin staking Xyzal 5 mg once a day. Stop loratadine. - Continue nasal saline washes  - Continue Alaway eye drops as needed - Stop Atrovent nasal spray. Begin Dymista 1 spray in each nostril twice a day - Begin montelukast 10 mg once a day  Gastroesophageal reflux disease - Continue dietary modifications including decreasing caffeine and chocolate intake  Follow up with allergy testing and in 2 months

## 2017-12-13 NOTE — Progress Notes (Signed)
Anne Mills [id number] Dept: [phone]  FAMILY NURSE PRACTITIONER FOLLOW UP NOTE  Patient ID: Anne Mills, female    DOB: Dec 07, 1946  Age: 72 y.o. MRN: [id number] Date of Office Visit: 12/13/2017  Assessment  Chief Complaint: Allergic Rhinitis  (getting worse) and Headache  HPI Anne Mills presents to the clinic today for a follow-up evaluation.  She was last seen in this clinic on 01/20/2017 by Dr. Neldon Mc for evaluation of respiratory irritation following an acute sinusitis and bronchitis which has been treated with azithromycin and Augmentin with resolution prior to her visit.  At today's visit, Anne Mills reports "my allergies are driving me crazy ".  She reports symptoms including headache, runny nose, eyes tearing and burning which is year-round and getting worse each year.  She reports that she had skin testing about 20 years ago with Dr. Shaune Leeks.  She is currently taking loratadine 10 mg daily and pseudoephedrine as needed to control her runny nose and head congestion that leads to headache.  She has tried Atrovent nasal spray which resulted in epistaxis.  She does use a saline nasal spray daily.  Anne Mills reports a remote history of asthma and denies any current symptoms.  She is experiencing no shortness of breath, wheeze, cough, and limitation of daily activities.  She does not use any inhalers or asthma medications at this point.    She reports her gastroesophageal reflux disease is well controlled with the use of apple cider vinegar and dietary modifications including decreasing caffeine and chocolate intake.  She reports she is is unable to tolerate a PPI or an H2 blocker.   Drug Allergies:  Allergies  Allergen Reactions  . Gluten Meal Cough  . Biaxin [Clarithromycin] Nausea And Vomiting    Physical Exam: BP 118/66 (BP Location: Left Arm, Patient Position: Sitting, Cuff Size: Normal)   Pulse 88   Resp 16    Physical Exam  Constitutional:  She is oriented to person, place, and time. She appears well-developed and well-nourished.  HENT:  Right Ear: External ear normal.  Left Ear: External ear normal.  Eyes normal.  Ears normal.  Bilateral nares erythematous and edematous with clear discharge noted.  Pharynx slightly erythematous with no exudate noted  Eyes: Conjunctivae are normal.  Neck: Normal range of motion. Neck supple.  Cardiovascular: Normal rate, regular rhythm and normal heart sounds.  S1-S2 normal.  Regular rate and rhythm.  No murmur noted  Pulmonary/Chest: Effort normal and breath sounds normal.  Lungs clear to auscultation  Musculoskeletal: Normal range of motion.  Neurological: She is alert and oriented to person, place, and time.  Skin: Skin is warm and dry.  Psychiatric: She has a normal mood and affect. Her behavior is normal.    Diagnostics: FVC 1.92, FEV1 1.64.  Predicted FVC 2.47, predicted FEV1 1.88.  Spirometry indicates mild restriction.  Assessment and Plan: 1. History of asthma   2. Other allergic rhinitis   3. Seasonal allergic conjunctivitis   4. Gastroesophageal reflux disease, esophagitis presence not specified     Meds ordered this encounter  Medications  . Azelastine-Fluticasone (DYMISTA) 137-50 MCG/ACT SUSP    Sig: Place 1 spray into both nostrils 2 (two) times daily.    Dispense:  1 Bottle    Refill:  5  . levocetirizine (XYZAL) 5 MG tablet    Sig: Take 1 tablet (5 mg total) by mouth every evening.    Dispense:  30 tablet    Refill:  5  . montelukast (SINGULAIR) 10 MG tablet    Sig: Take 1 tablet (10 mg total) by mouth at bedtime.    Dispense:  30 tablet    Refill:  5    Patient Instructions  Allergic rhinitis/conjunctivitis - Make an appointment for allergy skin testing - Begin staking Xyzal 5 mg once a day. Stop loratadine. - Continue nasal saline washes  - Continue Alaway eye drops as needed - Stop Atrovent nasal spray. Begin Dymista 1 spray in each nostril twice a  day - Begin montelukast 10 mg once a day  Gastroesophageal reflux disease - Continue dietary modifications including decreasing caffeine and chocolate intake  Follow up with allergy testing and in 2 months     Return in about 2 months (around 02/10/2018), or if symptoms worsen or fail to improve.    Thank you for the opportunity to care for this patient.  Please do not hesitate to contact me with questions.  Gareth Morgan, FNP [facility] [place] [facility]   I have provided oversight concerning Anne Mills's evaluation and treatment of this patient's health issues addressed during today's encounter.  I agree with the assessment and therapeutic plan as outlined in the note.   Signed,   R Edgar Frisk, MD

## 2017-12-13 NOTE — Progress Notes (Deleted)
Nurse Practitioner Follow-up Note  RE: Anne Mills MRN: [id number] DOB: August 05, 1946 Date of Office Visit: 12/13/2017  Primary care provider: Wardell Honour, MD Referring provider: Wardell Honour, MD  History of present illness: Detta presents to the clinic today for a follow-up evaluation.  She was last seen in this clinic on 01/20/2017 by Dr. Neldon Mc for evaluation of respiratory irritation following an acute sinusitis and bronchitis which has been treated with azithromycin and Augmentin with resolution prior to her visit.  At today's visit, Anne Mills reports "my allergies are driving me crazy ".  She reports symptoms including headache, runny nose, eyes tearing and burning which is year-round and getting worse each year.  She reports that she had skin testing about 20 years ago with Dr. Shaune Leeks.  She is currently taking loratadine 10 mg daily and pseudoephedrine chlophedinal as needed to control her runny nose and head congestion that leads to headache.  She has tried Atrovent nasal spray which resulted in epistaxis.  She does use a saline nasal spray daily.  Anne Mills reports a remote history of asthma and denies any current symptoms.  She is experiencing no shortness of breath, wheeze, cough, and limitation of daily activities.  She does not use any inhalers or asthma medications at this point.     Assessment and plan: No problem-specific Assessment & Plan notes found for this encounter.   Meds ordered this encounter  Medications  . Azelastine-Fluticasone (DYMISTA) 137-50 MCG/ACT SUSP    Sig: Place 1 spray into both nostrils 2 (two) times daily.    Dispense:  1 Bottle    Refill:  5  . levocetirizine (XYZAL) 5 MG tablet    Sig: Take 1 tablet (5 mg total) by mouth every evening.    Dispense:  30 tablet    Refill:  5  . montelukast (SINGULAIR) 10 MG tablet    Sig: Take 1 tablet (10 mg total) by mouth at bedtime.    Dispense:  30 tablet    Refill:  5    Diagnostics:     Physical  examination: Blood pressure 118/66, pulse 88, resp. rate 16.  General: Alert, interactive, in no acute distress. HEENT: TMs pearly gray, turbinates {Blank single:19197::"non-edematous","edematous","edematous and pale","markedly edematous","markedly edematous and pale","moderately edematous","mildly edematous","minimally edematous"} {Blank single:19197::"with crusty discharge","with thick discharge","with clear discharge","without discharge"}, post-pharynx {Blank single:19197::"non erythematous","erythematous","markedly erythematous","moderately erythematous","mildly erythematous","unremarkable"}. Neck: Supple without lymphadenopathy. Lungs: {Blank single:19197::"Decreased breath sounds with expiratory wheezing bilaterally","Mildly decreased breath sounds with expiratory wheezing bilaterally","Decreased breath sounds bilaterally without wheezing, rhonchi or rales","Mildly decreased breath sounds bilaterally without wheezing, rhonchi or rales","Clear to auscultation without wheezing, rhonchi or rales"}. CV: Normal S1, S2 without murmurs. Skin: {Blank single:19197::"Dry, erythematous, excoriated patches on the ***","Dry, hyperpigmented, thickened patches on the ***","Dry, mildly hyperpigmented, mildly thickened patches on the ***","Scattered erythematous urticarial type lesions primarily located *** , nonvesicular","Warm and dry, without lesions or rashes"}.  {Common ambulatory SmartLinks:19316::" "}  Allergies as of 12/13/2017      Reactions   Gluten Meal Cough   Biaxin [clarithromycin] Nausea And Vomiting      Medication List        Accurate as of 12/13/17  1:04 PM. Always use your most recent med list.          ASPERCREME EX Apply 1 application topically 2 (two) times daily as needed (for back pain).   Azelastine-Fluticasone 137-50 MCG/ACT Susp Commonly known as:  DYMISTA Place 1 spray into both nostrils 2 (two) times daily.   CALCIUM  600 + D PO Take 1 tablet by mouth daily.   CALCIUM  PO Take 1,200 mg by mouth.   Camphor-Menthol-Methyl Sal 1.2-5.7-6.3 % Ptch Apply topically.   Cholecalciferol 2000 units Tabs Take 1 tablet by mouth daily.   EVENING PRIMROSE OIL PO Take 1,300 mg by mouth.   FISH OIL PO Take by mouth.   gabapentin 300 MG capsule Commonly known as:  NEURONTIN Take 300 mg by mouth 3 (three) times daily.   GELATIN PO Take 1,300 mg by mouth.   ipratropium 0.03 % nasal spray Commonly known as:  ATROVENT Place 2 sprays into both nostrils 4 (four) times daily.   ketoconazole 2 % cream Commonly known as:  NIZORAL Apply 1 application topically 2 (two) times daily.   levocetirizine 5 MG tablet Commonly known as:  XYZAL Take 1 tablet (5 mg total) by mouth every evening.   loratadine 10 MG tablet Commonly known as:  CLARITIN Take 10 mg by mouth daily.   Magnesium 500 MG Caps Take by mouth.   montelukast 10 MG tablet Commonly known as:  SINGULAIR Take 1 tablet (10 mg total) by mouth at bedtime.   NAPROXEN PO Take by mouth as needed.   OSTEO BI-FLEX TRIPLE STRENGTH Tabs Take 2 tablets by mouth daily.   OVER THE COUNTER MEDICATION Transitions w/Hesperidin vegetarian taking   OVER THE COUNTER MEDICATION Wal-Finate Dw/Pseudoephedrine taking   PROBIOTIC PO Take by mouth. 10 Billion Active Cultures   PSEUDOEPHED-CHLORPHEN-DM-GG PO Take by mouth.   STRESS TAB NF Tabs Take 1 tablet by mouth daily.   VITAMIN B-COMPLEX PO Take by mouth. Liquid       Allergies  Allergen Reactions  . Gluten Meal Cough  . Biaxin [Clarithromycin] Nausea And Vomiting    I appreciate the opportunity to take part in Lexey's care. Please do not hesitate to contact me with questions.  Sincerely,   Gareth Morgan, FNP

## 2018-01-12 DIAGNOSIS — Z1231 Encounter for screening mammogram for malignant neoplasm of breast: Secondary | ICD-10-CM | POA: Diagnosis not present

## 2018-01-12 DIAGNOSIS — Z803 Family history of malignant neoplasm of breast: Secondary | ICD-10-CM | POA: Diagnosis not present

## 2018-01-19 ENCOUNTER — Encounter: Payer: Self-pay | Admitting: Allergy and Immunology

## 2018-01-19 ENCOUNTER — Ambulatory Visit: Payer: Medicare Other | Admitting: Allergy and Immunology

## 2018-01-19 VITALS — BP 100/62 | HR 84 | Resp 16

## 2018-01-19 DIAGNOSIS — H1010 Acute atopic conjunctivitis, unspecified eye: Secondary | ICD-10-CM

## 2018-01-19 DIAGNOSIS — Z8709 Personal history of other diseases of the respiratory system: Secondary | ICD-10-CM | POA: Diagnosis not present

## 2018-01-19 DIAGNOSIS — G43909 Migraine, unspecified, not intractable, without status migrainosus: Secondary | ICD-10-CM

## 2018-01-19 DIAGNOSIS — K219 Gastro-esophageal reflux disease without esophagitis: Secondary | ICD-10-CM

## 2018-01-19 DIAGNOSIS — J3089 Other allergic rhinitis: Secondary | ICD-10-CM | POA: Diagnosis not present

## 2018-01-19 NOTE — Progress Notes (Signed)
Follow-up Note  Referring Provider: Wardell Honour, MD Primary Provider: Wardell Honour, MD Date of Office Visit: 01/19/2018  Subjective:   Anne Mills (DOB: [birth year]) is a 72 y.o. female who returns to the [facility] on 01/19/2018 in re-evaluation of the following:  HPI: Anne Mills presents to this clinic in reevaluation of several issues. She was last seen in this clinic by our nurse practitioner on 12 September 2018 at which point in time she was started on Dymista and montelukast for persistent respiratory tract problems.  I have not seen her in his clinic in over a year.  She has several different complaints.  First, she has occasional nasal congestion and head fullness.  She is very hesitant about using nasal sprays because she developed a "infection" when using a nasal spray in the past.  Thus, she is not using the [place].  Montelukast may help the symptoms somewhat.  She does not have any anosmia or ugly nasal taste  Second, she has headaches.  She has a headache in her frontal region and sometimes on the top of her head that is pounding.  There is no associated scotoma or dizziness or nausea and they usually last 30-60 minutes and they occur about twice a week.  This has been a long-standing issue.  When she goes out to shop she usually develops a headache and she can develop a headache at home.  She does not consume any caffeine but does eat chocolate every day.  Third, she has a history of reflux and this is under very good control with her apple cider vinegar supplement.  She has a history of asthma that has been an inactive for years and she has not used a bronchodilator in years.  She did obtain the flu vaccine this year  Allergies as of 01/19/2018      Reactions   Gluten Meal Cough   Biaxin [clarithromycin] Nausea And Vomiting      Medication List      ASPERCREME EX Apply 1 application topically 2 (two) times daily as needed (for back pain).   Azelastine-Fluticasone 137-50 MCG/ACT Susp Commonly known as:  DYMISTA Place 1 spray into both nostrils 2 (two) times daily.   CALCIUM 600 + D PO Take 1 tablet by mouth daily.   CALCIUM PO Take 1,200 mg by mouth.   Camphor-Menthol-Methyl Sal 1.2-5.7-6.3 % Ptch Apply topically.   Cholecalciferol 2000 units Tabs Take 1 tablet by mouth daily.   EVENING PRIMROSE OIL PO Take 1,300 mg by mouth.   FISH OIL PO Take by mouth.   gabapentin 300 MG capsule Commonly known as:  NEURONTIN Take 300 mg by mouth 3 (three) times daily.   GELATIN PO Take 1,300 mg by mouth.   levocetirizine 5 MG tablet Commonly known as:  XYZAL Take 1 tablet (5 mg total) by mouth every evening.   Magnesium 500 MG Caps Take by mouth.   montelukast 10 MG tablet Commonly known as:  SINGULAIR Take 1 tablet (10 mg total) by mouth at bedtime.   NAPROXEN PO Take by mouth as needed.   OSTEO BI-FLEX TRIPLE STRENGTH Tabs Take 2 tablets by mouth daily.   OVER THE COUNTER MEDICATION Transitions w/Hesperidin vegetarian taking   OVER THE COUNTER MEDICATION Wal-Finate Dw/Pseudoephedrine taking   PROBIOTIC PO Take by mouth. 10 Billion Active Cultures   PSEUDOEPHED-CHLORPHEN-DM-GG PO Take by mouth.   STRESS TAB NF Tabs Take 1 tablet by mouth daily.  VITAMIN B-COMPLEX PO Take by mouth. Liquid       Past Medical History:  Diagnosis Date  . Asthma    no inhaler  . Back pain, chronic   . Chest pain    under left breast- 2-3 x per week: h/o breast surgery  . GERD (gastroesophageal reflux disease)   . Headache(784.0)   . Heart murmur    "not serious" per pt-no echo  . Scoliosis   . Seasonal allergies   . Shortness of breath    per pt due to allergies    Past Surgical History:  Procedure Laterality Date  . ABDOMINOPLASTY    . BREAST SURGERY     lt cyst  . COLONOSCOPY    . DILATION AND CURETTAGE OF UTERUS N/A 03/03/2013   Procedure: DILATATION AND CURETTAGE;  Surgeon: Lahoma Crocker, MD;  Location: [facility];  Service: Gynecology;  Laterality: N/A;  . EXAMINATION UNDER ANESTHESIA N/A 03/03/2013   Procedure: EXAM UNDER ANESTHESIA with ultrasound;  Surgeon: Lahoma Crocker, MD;  Location: [facility];  Service: Gynecology;  Laterality: N/A;  . TRIGGER FINGER RELEASE Left 06/21/2014   Procedure: LEFT THUMB TRIGGER RELEASE;  Surgeon: Tennis Must, MD;  Location: [facility];  Service: Orthopedics;  Laterality: Left;    Review of systems negative except as noted in HPI / PMHx or noted below:  Review of Systems  Constitutional: Negative.   HENT: Negative.   Eyes: Negative.   Respiratory: Negative.   Cardiovascular: Negative.   Gastrointestinal: Negative.   Genitourinary: Negative.   Musculoskeletal: Negative.   Skin: Negative.   Neurological: Negative.   Endo/Heme/Allergies: Negative.   Psychiatric/Behavioral: Negative.      Objective:   Vitals:   01/19/18 0933  BP: 100/62  Pulse: 84  Resp: 16          Physical Exam  Constitutional: She is well-developed, well-nourished, and in no distress.  HENT:  Head: Normocephalic.  Right Ear: Tympanic membrane, external ear and ear canal normal.  Left Ear: Tympanic membrane, external ear and ear canal normal.  Nose: Nose normal. No mucosal edema or rhinorrhea.  Mouth/Throat: Uvula is midline, oropharynx is clear and moist and mucous membranes are normal. No oropharyngeal exudate.  Eyes: Conjunctivae are normal.  Neck: Trachea normal. No tracheal tenderness present. No tracheal deviation present. No thyromegaly present.  Cardiovascular: Normal rate, regular rhythm, S1 normal, S2 normal and normal heart sounds.  No murmur heard. Pulmonary/Chest: Breath sounds normal. No stridor. No respiratory distress. She has no wheezes. She has no rales.  Musculoskeletal: She exhibits no edema.  Lymphadenopathy:       Head (right side): No tonsillar adenopathy present.       Head (left side): No tonsillar  adenopathy present.    She has no cervical adenopathy.  Neurological: She is alert. Gait normal.  Skin: No rash noted. She is not diaphoretic. No erythema. Nails show no clubbing.  Psychiatric: Mood and affect normal.    Diagnostics: Allergy skin testing was performed.  She demonstrated hypersensitivity to mold.  Assessment and Plan:   1. Other allergic rhinitis   2. Seasonal allergic conjunctivitis   3. Gastroesophageal reflux disease, esophagitis presence not specified   4. History of asthma   5. Migraine syndrome     1.  Allergen avoidance measures - molds. Check area 2 aeroallergen panel  2.  Consider a course of immunotherapy  3.  Continue montelukast 10 mg daily  4.  Use Dymista 1 spray  each nostril 1-2 times a day  5. Continue therapy for reflux including apple cider vinegar  6.  Eliminate consumption of chocolate and all forms of caffeine  7.  Further evaluation for headaches?  8.  Return to clinic in 6 months or earlier if problem  I will have Haroldine use a combination of anti-inflammatory medications for her respiratory tract and have her consider starting a course of immunotherapy.  She did appear to develop sensitivity against molds on today's testing and to be complete we will check an area 2 aero allergen profile to look at other allergens that may be causing some problem.  She has headaches and they actually sound like migraines and I have asked her to consolidate her daily chocolate consumption and we will see if she still continues to have headaches and if so she probably will require further evaluation and treatment for this issue.  Allena Katz, MD Allergy / Immunology [place]

## 2018-01-19 NOTE — Patient Instructions (Addendum)
  1.  Allergen avoidance measures - molds. Check area 2 aeroallergen panel  2.  Consider a course of immunotherapy  3.  Continue montelukast 10 mg daily  4.  Use Dymista 1 spray each nostril 1-2 times a day  5. Continue therapy for reflux including apple cider vinegar  6.  Eliminate consumption of chocolate and all forms of caffeine  7.  Further evaluation for headaches?  8.  Return to clinic in 6 months or earlier if problem

## 2018-01-20 ENCOUNTER — Encounter: Payer: Self-pay | Admitting: Allergy and Immunology

## 2018-01-21 LAB — ALLERGENS W/TOTAL IGE AREA 2
Alternaria Alternata IgE: <0.1 kU/L
Aspergillus Fumigatus IgE: <0.1 kU/L
Bermuda Grass IgE: <0.1 kU/L
Cat Dander IgE: <0.1 kU/L
Cladosporium Herbarum IgE: <0.1 kU/L
Common Silver Birch IgE: <0.1 kU/L
D Farinae IgE: <0.1 kU/L
D Pteronyssinus IgE: <0.1 kU/L
Elm, American IgE: <0.1 kU/L
IgE (Immunoglobulin E), Serum: 36 IU/mL (ref 0–100)
Maple/Box Elder IgE: <0.1 kU/L
Pecan, Hickory IgE: <0.1 kU/L
Timothy Grass IgE: <0.1 kU/L

## 2018-01-25 DIAGNOSIS — G894 Chronic pain syndrome: Secondary | ICD-10-CM | POA: Diagnosis not present

## 2018-01-25 DIAGNOSIS — M4726 Other spondylosis with radiculopathy, lumbar region: Secondary | ICD-10-CM | POA: Diagnosis not present

## 2018-01-26 ENCOUNTER — Ambulatory Visit: Payer: Medicare Other | Admitting: Allergy and Immunology

## 2018-01-26 DIAGNOSIS — J309 Allergic rhinitis, unspecified: Secondary | ICD-10-CM

## 2018-02-11 ENCOUNTER — Telehealth: Payer: Self-pay | Admitting: Allergy and Immunology

## 2018-02-11 NOTE — Telephone Encounter (Signed)
Pt called and said that she could not start the injection and do not make any vials.

## 2018-02-11 NOTE — Telephone Encounter (Signed)
See below

## 2018-02-14 NOTE — Telephone Encounter (Signed)
Vials not ordered or made.

## 2018-02-16 ENCOUNTER — Ambulatory Visit (INDEPENDENT_AMBULATORY_CARE_PROVIDER_SITE_OTHER): Payer: Medicare Other | Admitting: Family Medicine

## 2018-02-16 ENCOUNTER — Other Ambulatory Visit: Payer: Self-pay

## 2018-02-16 ENCOUNTER — Encounter: Payer: Self-pay | Admitting: Family Medicine

## 2018-02-16 VITALS — BP 112/60 | HR 87 | Temp 98.3°F | Resp 16 | Ht 61.81 in | Wt 117.8 lb

## 2018-02-16 DIAGNOSIS — N882 Stricture and stenosis of cervix uteri: Secondary | ICD-10-CM | POA: Diagnosis not present

## 2018-02-16 DIAGNOSIS — J4520 Mild intermittent asthma, uncomplicated: Secondary | ICD-10-CM

## 2018-02-16 DIAGNOSIS — J3089 Other allergic rhinitis: Secondary | ICD-10-CM | POA: Diagnosis not present

## 2018-02-16 DIAGNOSIS — K219 Gastro-esophageal reflux disease without esophagitis: Secondary | ICD-10-CM | POA: Diagnosis not present

## 2018-02-16 DIAGNOSIS — E2839 Other primary ovarian failure: Secondary | ICD-10-CM | POA: Diagnosis not present

## 2018-02-16 DIAGNOSIS — E7800 Pure hypercholesterolemia, unspecified: Secondary | ICD-10-CM

## 2018-02-16 DIAGNOSIS — M4120 Other idiopathic scoliosis, site unspecified: Secondary | ICD-10-CM | POA: Diagnosis not present

## 2018-02-16 DIAGNOSIS — H1010 Acute atopic conjunctivitis, unspecified eye: Secondary | ICD-10-CM

## 2018-02-16 DIAGNOSIS — M19041 Primary osteoarthritis, right hand: Secondary | ICD-10-CM

## 2018-02-16 DIAGNOSIS — M5030 Other cervical disc degeneration, unspecified cervical region: Secondary | ICD-10-CM

## 2018-02-16 DIAGNOSIS — M19042 Primary osteoarthritis, left hand: Secondary | ICD-10-CM

## 2018-02-16 DIAGNOSIS — Z0000 Encounter for general adult medical examination without abnormal findings: Secondary | ICD-10-CM

## 2018-02-16 LAB — CBC WITH DIFFERENTIAL/PLATELET
BASOS ABS: 0.1 10*3/uL (ref 0.0–0.2)
Basos: 1 %
EOS (ABSOLUTE): 0.1 10*3/uL (ref 0.0–0.4)
Eos: 2 %
Hematocrit: 44.2 % (ref 34.0–46.6)
Hemoglobin: 14.9 g/dL (ref 11.1–15.9)
IMMATURE GRANULOCYTES: 0 %
Immature Grans (Abs): 0 10*3/uL (ref 0.0–0.1)
LYMPHS ABS: 1.5 10*3/uL (ref 0.7–3.1)
Lymphs: 36 %
MCH: 30.2 pg (ref 26.6–33.0)
MCHC: 33.7 g/dL (ref 31.5–35.7)
MCV: 90 fL (ref 79–97)
MONOCYTES: 10 %
MONOS ABS: 0.4 10*3/uL (ref 0.1–0.9)
NEUTROS PCT: 51 %
Neutrophils Absolute: 2.1 10*3/uL (ref 1.4–7.0)
Platelets: 274 10*3/uL (ref 150–379)
RBC: 4.94 x10E6/uL (ref 3.77–5.28)
RDW: 13.5 % (ref 12.3–15.4)
WBC: 4.2 10*3/uL (ref 3.4–10.8)

## 2018-02-16 LAB — COMPREHENSIVE METABOLIC PANEL
ALK PHOS: 119 IU/L — AB (ref 39–117)
ALT: 24 IU/L (ref 0–32)
AST: 28 IU/L (ref 0–40)
Albumin/Globulin Ratio: 1.7 (ref 1.2–2.2)
Albumin: 4.7 g/dL (ref 3.5–4.8)
BUN/Creatinine Ratio: 27 (ref 12–28)
BUN: 18 mg/dL (ref 8–27)
Bilirubin Total: 0.8 mg/dL (ref 0.0–1.2)
CALCIUM: 9.9 mg/dL (ref 8.7–10.3)
CO2: 26 mmol/L (ref 20–29)
CREATININE: 0.66 mg/dL (ref 0.57–1.00)
Chloride: 103 mmol/L (ref 96–106)
GFR calc Af Amer: 104 mL/min/{1.73_m2} (ref >59)
GFR calc non Af Amer: 90 mL/min/{1.73_m2} (ref >59)
GLUCOSE: 85 mg/dL (ref 65–99)
Globulin, Total: 2.7 g/dL (ref 1.5–4.5)
Potassium: 4.4 mmol/L (ref 3.5–5.2)
Sodium: 143 mmol/L (ref 134–144)
Total Protein: 7.4 g/dL (ref 6.0–8.5)

## 2018-02-16 LAB — LIPID PANEL
CHOLESTEROL TOTAL: 221 mg/dL — AB (ref 100–199)
Chol/HDL Ratio: 3.5 ratio (ref 0.0–4.4)
HDL: 64 mg/dL (ref >39)
LDL CALC: 136 mg/dL — AB (ref 0–99)
TRIGLYCERIDES: 103 mg/dL (ref 0–149)
VLDL CHOLESTEROL CAL: 21 mg/dL (ref 5–40)

## 2018-02-16 NOTE — Progress Notes (Signed)
Subjective:    Patient ID: Anne Mills, female    DOB: 03-07-1946, 72 y.o.   MRN: [id number]  02/16/2018  Annual Exam    HPI This 72 y.o. female presents for [place].  Last physical:  10-06-2016 Pap smear:   N/a; 2010; postmenopausal Mammogram:  02-12-2017, 01-2018 [place] Colonoscopy:  5/2011Sadie Mills; ?[place] Bone density:  2013 Eye exam:    Glasses; needs new provider.  10/2016. Dental exam:  Every six months; two dental providers; gums, implants, crowns.    Stress reaction: daughter moved to [place]; pt caring for daughter.  Admission for one month behavioral health. [place].   Cervical neck sprain: s/p xray with spine and scoliosis.  Follow-up in six months. Gabapentin for nerve pain in neck.      Visual Acuity Screening   Right eye Left eye Both eyes  Without correction:     With correction: 20/20 20/25 20/15     BP Readings from Last 3 Encounters:  02/16/18 112/60  01/19/18 100/62  12/13/17 118/66   Wt Readings from Last 3 Encounters:  02/16/18 117 lb 12.8 oz (53.4 kg)  09/15/17 121 lb (54.9 kg)  07/21/17 118 lb (53.5 kg)   Immunization History  Administered Date(s) Administered  . Influenza,inj,Quad PF,6+ Mos 08/22/2014, 09/15/2017  . Influenza-Unspecified 10/06/2016  . Pneumococcal Polysaccharide-23 07/24/2015, 09/15/2017  . Td 07/24/2015  . Zoster 12/08/2007   Health Maintenance  Topic Date Due  . COLONOSCOPY  03/03/1997  . PNA vac Low Risk Adult (2 of 2 - PCV13) 09/15/2018  . MAMMOGRAM  01/14/2019  . TETANUS/TDAP  07/23/2025  . INFLUENZA VACCINE  Completed  . DEXA SCAN  Completed  . Hepatitis C Screening  Completed    Review of Systems  Constitutional: Positive for chills and diaphoresis. Negative for activity change, appetite change, fatigue, fever and unexpected weight change.  HENT: Positive for rhinorrhea, sinus pain, sneezing and tinnitus. Negative for congestion, dental problem, drooling,  ear discharge, ear pain, facial swelling, hearing loss, mouth sores, nosebleeds, postnasal drip, sinus pressure, sore throat, trouble swallowing and voice change.   Eyes: Positive for photophobia, redness and itching. Negative for pain, discharge and visual disturbance.  Respiratory: Negative for apnea, cough, choking, chest tightness, shortness of breath, wheezing and stridor.   Cardiovascular: Negative for chest pain, palpitations and leg swelling.  Gastrointestinal: Negative for abdominal distention, abdominal pain, anal bleeding, blood in stool, constipation, diarrhea, nausea, rectal pain and vomiting.  Endocrine: Negative for cold intolerance, heat intolerance, polydipsia, polyphagia and polyuria.  Genitourinary: Negative for decreased urine volume, difficulty urinating, dyspareunia, dysuria, enuresis, flank pain, frequency, genital sores, hematuria, menstrual problem, pelvic pain, urgency, vaginal bleeding, vaginal discharge and vaginal pain.       Nocturia x 0.  NO URINARY LEAKAGE.  Musculoskeletal: Positive for arthralgias, back pain, neck pain and neck stiffness. Negative for gait problem, joint swelling and myalgias.  Skin: Negative for color change, pallor, rash and wound.  Allergic/Immunologic: Positive for environmental allergies and food allergies. Negative for immunocompromised state.  Neurological: Positive for headaches. Negative for dizziness, tremors, seizures, syncope, facial asymmetry, speech difficulty, weakness, light-headedness and numbness.  Hematological: Negative for adenopathy. Does not bruise/bleed easily.  Psychiatric/Behavioral: Negative for agitation, behavioral problems, confusion, decreased concentration, dysphoric mood, hallucinations, self-injury, sleep disturbance and suicidal ideas. The patient is not nervous/anxious and is not hyperactive.        Bedtime 100am; wakes up 900am.    Past Medical History:  Diagnosis Date  .  Allergy   . Asthma    no inhaler  .  Back pain, chronic   . Chest pain    under left breast- 2-3 x per week: h/o breast surgery  . GERD (gastroesophageal reflux disease)   . Headache(784.0)   . Heart murmur    "not serious" per pt-no echo  . Scoliosis   . Seasonal allergies   . Shortness of breath    per pt due to allergies   Past Surgical History:  Procedure Laterality Date  . ABDOMINOPLASTY    . BREAST SURGERY     lt cyst  . COLONOSCOPY    . DILATION AND CURETTAGE OF UTERUS N/A 03/03/2013   Procedure: DILATATION AND CURETTAGE;  Surgeon: Lahoma Crocker, MD;  Location: [facility];  Service: Gynecology;  Laterality: N/A;  . EXAMINATION UNDER ANESTHESIA N/A 03/03/2013   Procedure: EXAM UNDER ANESTHESIA with ultrasound;  Surgeon: Lahoma Crocker, MD;  Location: [facility];  Service: Gynecology;  Laterality: N/A;  . TRIGGER FINGER RELEASE Left 06/21/2014   Procedure: LEFT THUMB TRIGGER RELEASE;  Surgeon: Tennis Must, MD;  Location: [facility];  Service: Orthopedics;  Laterality: Left;   Allergies  Allergen Reactions  . Gluten Meal Cough  . Biaxin [Clarithromycin] Nausea And Vomiting   Current Outpatient Medications on File Prior to Visit  Medication Sig Dispense Refill  . Azelastine-Fluticasone (DYMISTA) 137-50 MCG/ACT SUSP Place 1 spray into both nostrils 2 (two) times daily. 1 Bottle 5  . B Complex Vitamins (VITAMIN B-COMPLEX PO) Take by mouth. Liquid    . Calcium Carbonate-Vitamin D (CALCIUM 600 + D PO) Take 1 tablet by mouth daily.    Marland Kitchen CALCIUM PO Take 1,200 mg by mouth.    . Camphor-Menthol-Methyl Sal 1.2-5.7-6.3 % PTCH Apply topically.    . Cholecalciferol 2000 UNITS TABS Take 1 tablet by mouth daily.    Marland Kitchen EVENING PRIMROSE OIL PO Take 1,300 mg by mouth.    . gabapentin (NEURONTIN) 300 MG capsule Take 300 mg by mouth 3 (three) times daily.    Marland Kitchen GELATIN PO Take 1,300 mg by mouth.    . levocetirizine (XYZAL) 5 MG tablet Take 1 tablet (5 mg total) by mouth every evening. 30 tablet 5  . Magnesium 500  MG CAPS Take by mouth.    . Misc Natural Products (OSTEO BI-FLEX TRIPLE STRENGTH) TABS Take 2 tablets by mouth daily.    . montelukast (SINGULAIR) 10 MG tablet Take 1 tablet (10 mg total) by mouth at bedtime. 30 tablet 5  . Multiple Vitamins-Minerals (STRESS TAB NF) TABS Take 1 tablet by mouth daily.    Marland Kitchen NAPROXEN PO Take by mouth as needed.    . Omega-3 Fatty Acids (FISH OIL PO) Take by mouth.    Marland Kitchen OVER THE COUNTER MEDICATION Transitions w/Hesperidin vegetarian taking    . OVER THE COUNTER MEDICATION Wal-Finate Dw/Pseudoephedrine taking    . Probiotic Product (PROBIOTIC PO) Take by mouth. 10 Billion Active Cultures    . PSEUDOEPHED-CHLORPHEN-DM-GG PO Take by mouth.    Loura Pardon Salicylate (ASPERCREME EX) Apply 1 application topically 2 (two) times daily as needed (for back pain).     No current facility-administered medications on file prior to visit.    Social History   Socioeconomic History  . Marital status: Married    Spouse name: Not on file  . Number of children: Not on file  . Years of education: Not on file  . Highest education level: Not on file  Social  Needs  . Financial resource strain: Not on file  . Food insecurity - worry: Not on file  . Food insecurity - inability: Not on file  . Transportation needs - medical: Not on file  . Transportation needs - non-medical: Not on file  Occupational History  . Occupation: retired    Comment: Development worker, community  Tobacco Use  . Smoking status: Never Smoker  . Smokeless tobacco: Never Used  Substance and Sexual Activity  . Alcohol use: No  . Drug use: No  . Sexual activity: Yes    Birth control/protection: Post-menopausal  Other Topics Concern  . Not on file  Social History Narrative   Marital status: married x 72 years; second marriage; happily married.  From [place]. Left [place] age 27; grew up [place].      Children:  2 daughters; 8 grandchildren; 85 gg.      Lives: with husband, daughter.      Employment: retired; clean homes       Tobacco; none      Alcohol: none      Drugs: none      Exercise: sporadic in 2019.  [place].      ADLs: independent with ADLs; no assistant devices.      Advanced Directives:  FULL CODE; no prolonged measures.         Family History  Problem Relation Age of Onset  . Dementia Mother   . Hypertension Mother   . Cancer Mother 41       Breast cancer  . Depression Mother   . Cancer Sister   . Diabetes Sister   . Heart disease Sister 71       AMI as cause of death  . Hyperlipidemia Sister   . Hypertension Sister   . Mental retardation Sister   . Cancer Father        prostate cancer  . Heart disease Sister 36       AMI  . Diabetes Sister        Objective:    BP 112/60   Pulse 87   Temp 98.3 F (36.8 C)   Resp 16   Ht 5' 1.81" (1.57 m)   Wt 117 lb 12.8 oz (53.4 kg)   SpO2 98%   BMI 21.68 kg/m  Physical Exam  Constitutional: She is oriented to person, place, and time. She appears well-developed and well-nourished. No distress.  HENT:  Head: Normocephalic and atraumatic.  Right Ear: Hearing, tympanic membrane, external ear and ear canal normal.  Left Ear: Hearing, tympanic membrane, external ear and ear canal normal.  Nose: Nose normal.  Mouth/Throat: Oropharynx is clear and moist.  Eyes: Conjunctivae and EOM are normal. Pupils are equal, round, and reactive to light.  Neck: Normal range of motion and full passive range of motion without pain. Neck supple. No JVD present. Carotid bruit is not present. No thyromegaly present.  Cardiovascular: Normal rate, regular rhythm, normal heart sounds and intact distal pulses. Exam reveals no gallop and no friction rub.  No murmur heard. Pulmonary/Chest: Effort normal and breath sounds normal. No respiratory distress. She has no wheezes. She has no rales. Right breast exhibits no inverted nipple, no mass, no nipple discharge, no skin change and no tenderness. Left breast exhibits no inverted nipple, no mass, no  nipple discharge, no skin change and no tenderness. Breasts are symmetrical.  Abdominal: Soft. Bowel sounds are normal. She exhibits no distension and no mass. There is no tenderness. There  is no rebound and no guarding.  Musculoskeletal:       Right shoulder: Normal.       Left shoulder: Normal.       Cervical back: Normal.  Lymphadenopathy:    She has no cervical adenopathy.  Neurological: She is alert and oriented to person, place, and time. She has normal reflexes. No cranial nerve deficit. She exhibits normal muscle tone. Coordination normal.  Skin: Skin is warm and dry. No rash noted. She is not diaphoretic. No erythema. No pallor.  Psychiatric: She has a normal mood and affect. Her behavior is normal. Judgment and thought content normal.  Nursing note and vitals reviewed.  No results found. Depression screen [facility] 2/9 02/16/2018 02/16/2018 09/15/2017 07/21/2017 07/01/2017  Decreased Interest 0 0 0 0 0  Down, Depressed, Hopeless 0 0 0 0 0  PHQ - 2 Score 0 0 0 0 0   Fall Risk  02/16/2018 02/16/2018 09/15/2017 07/21/2017 07/01/2017  Falls in the past year? No No No No No   Functional Status Survey:        Assessment & Plan:   1. Routine physical examination   2. Other allergic rhinitis   3. Gastroesophageal reflux disease without esophagitis   4. Scoliosis (and kyphoscoliosis), idiopathic   5. Cervical stenosis (uterine cervix)   6. Seasonal allergic conjunctivitis   7. Estrogen deficiency   8. DDD (degenerative disc disease), cervical   9. Primary osteoarthritis of both hands   10. Mild intermittent asthma without complication   11. Pure hypercholesterolemia     -anticipatory guidance provided --- exercise, weight loss, safe driving practices, aspirin 81mg  daily. -obtain age appropriate screening labs and labs for chronic disease management. -moderate fall risk; no evidence of depression; no evidence of hearing loss.  Discussed advanced directives and living will; also  discussed end of life issues including code status.  -followed by allergy and immunology for allergic rhinitis and asthma. -followed by ortho for DDD cervical spine.    Orders Placed This Encounter  Procedures  . DG Bone Density    Standing Status:   Future    Standing Expiration Date:   04/19/2019    Scheduling Instructions:     [place]    Order Specific Question:   Reason for Exam (SYMPTOM  OR DIAGNOSIS REQUIRED)    Answer:   estrogen deficiency    Order Specific Question:   Preferred imaging location?    Answer:   External  . CBC with Differential/Platelet  . Comprehensive metabolic panel    Order Specific Question:   Has the patient fasted?    Answer:   No  . Lipid panel    Order Specific Question:   Has the patient fasted?    Answer:   No   No orders of the defined types were placed in this encounter.   Return in about 1 year (around 02/17/2019) for complete physical examiniation.   Tarrance Januszewski Elayne Guerin, M.D. Primary Care at [facility] previously [facility] [address] [place], [place]  [id number] [phone] phone [phone] fax

## 2018-02-16 NOTE — Patient Instructions (Addendum)
Call [facility] for bone density scan. Call [facility].   IF you received an x-ray today, you will receive an invoice from [facility]. Please contact [facility] at [phone] with questions or concerns regarding your invoice.   IF you received labwork today, you will receive an invoice from [facility]. Please contact [facility] at [phone] with questions or concerns regarding your invoice.   Our billing staff will not be able to assist you with questions regarding bills from these companies.  You will be contacted with the lab results as soon as they are available. The fastest way to get your results is to activate your My Chart account. Instructions are located on the last page of this paperwork. If you have not heard from [place] regarding the results in 2 weeks, please contact this office.      Preventive Care 20 Years and Older, Female Preventive care refers to lifestyle choices and visits with your health care provider that can promote health and wellness. What does preventive care include?  A yearly physical exam. This is also called an annual well check.  Dental exams once or twice a year.  Routine eye exams. Ask your health care provider how often you should have your eyes checked.  Personal lifestyle choices, including: ? Daily care of your teeth and gums. ? Regular physical activity. ? Eating a healthy diet. ? Avoiding tobacco and drug use. ? Limiting alcohol use. ? Practicing safe sex. ? Taking low-dose aspirin every day. ? Taking vitamin and mineral supplements as recommended by your health care provider. What happens during an annual well check? The services and screenings done by your health care provider during your annual well check will depend on your age, overall health, lifestyle risk factors, and family history of disease. Counseling Your health care provider may ask you questions about your:  Alcohol use.  Tobacco  use.  Drug use.  Emotional well-being.  Home and relationship well-being.  Sexual activity.  Eating habits.  History of falls.  Memory and ability to understand (cognition).  Work and work Statistician.  Reproductive health.  Screening You may have the following tests or measurements:  Height, weight, and BMI.  Blood pressure.  Lipid and cholesterol levels. These may be checked every 5 years, or more frequently if you are over 67 years old.  Skin check.  Lung cancer screening. You may have this screening every year starting at age 40 if you have a 30-pack-year history of smoking and currently smoke or have quit within the past 15 years.  Fecal occult blood test (FOBT) of the stool. You may have this test every year starting at age 41.  Flexible sigmoidoscopy or colonoscopy. You may have a sigmoidoscopy every 5 years or a colonoscopy every 10 years starting at age 71.  Hepatitis C blood test.  Hepatitis B blood test.  Sexually transmitted disease (STD) testing.  Diabetes screening. This is done by checking your blood sugar (glucose) after you have not eaten for a while (fasting). You may have this done every 1-3 years.  Bone density scan. This is done to screen for osteoporosis. You may have this done starting at age 77.  Mammogram. This may be done every 1-2 years. Talk to your health care provider about how often you should have regular mammograms.  Talk with your health care provider about your test results, treatment options, and if necessary, the need for more tests. Vaccines Your health care provider may recommend certain  vaccines, such as:  Influenza vaccine. This is recommended every year.  Tetanus, diphtheria, and acellular pertussis (Tdap, Td) vaccine. You may need a Td booster every 10 years.  Varicella vaccine. You may need this if you have not been vaccinated.  Zoster vaccine. You may need this after age 27.  Measles, mumps, and rubella (MMR)  vaccine. You may need at least one dose of MMR if you were born in 1957 or later. You may also need a second dose.  Pneumococcal 13-valent conjugate (PCV13) vaccine. One dose is recommended after age 3.  Pneumococcal polysaccharide (PPSV23) vaccine. One dose is recommended after age 1.  Meningococcal vaccine. You may need this if you have certain conditions.  Hepatitis A vaccine. You may need this if you have certain conditions or if you travel or work in places where you may be exposed to hepatitis A.  Hepatitis B vaccine. You may need this if you have certain conditions or if you travel or work in places where you may be exposed to hepatitis B.  Haemophilus influenzae type b (Hib) vaccine. You may need this if you have certain conditions.  Talk to your health care provider about which screenings and vaccines you need and how often you need them. This information is not intended to replace advice given to you by your health care provider. Make sure you discuss any questions you have with your health care provider. Document Released: 12/20/2015 Document Revised: 08/12/2016 Document Reviewed: 09/24/2015 Elsevier Interactive Patient Education  [employer].

## 2018-03-02 ENCOUNTER — Other Ambulatory Visit: Payer: Self-pay

## 2018-03-02 MED ORDER — MONTELUKAST SODIUM 10 MG PO TABS: 10.0000 mg | ORAL_TABLET | Freq: Every day | ORAL | 2 refills | Status: DC

## 2018-03-02 NOTE — Progress Notes (Signed)
Fax request for a refill on montelukast. I have sent that in to pharmacy.

## 2018-03-30 DIAGNOSIS — H527 Unspecified disorder of refraction: Secondary | ICD-10-CM | POA: Diagnosis not present

## 2018-03-30 DIAGNOSIS — H2513 Age-related nuclear cataract, bilateral: Secondary | ICD-10-CM | POA: Diagnosis not present

## 2018-03-30 DIAGNOSIS — H04123 Dry eye syndrome of bilateral lacrimal glands: Secondary | ICD-10-CM | POA: Diagnosis not present

## 2018-04-07 ENCOUNTER — Other Ambulatory Visit: Payer: Self-pay | Admitting: Allergy and Immunology

## 2018-04-07 DIAGNOSIS — J3089 Other allergic rhinitis: Secondary | ICD-10-CM

## 2018-04-07 NOTE — Progress Notes (Signed)
VIALS EXP 04-09-19

## 2018-04-08 DIAGNOSIS — J3089 Other allergic rhinitis: Secondary | ICD-10-CM | POA: Diagnosis not present

## 2018-04-13 ENCOUNTER — Ambulatory Visit: Payer: Medicare Other

## 2018-04-20 ENCOUNTER — Ambulatory Visit (INDEPENDENT_AMBULATORY_CARE_PROVIDER_SITE_OTHER): Payer: Medicare Other | Admitting: *Deleted

## 2018-04-20 DIAGNOSIS — J309 Allergic rhinitis, unspecified: Secondary | ICD-10-CM

## 2018-04-20 NOTE — Progress Notes (Signed)
Immunotherapy   Patient Details  Name: TENITA CUE MRN: [id number] Date of Birth: 09/05/1946  04/20/2018  [name] started injections for  Mold. Following schedule: B  Frequency:2 times per week Epi-Pen:Epi-Pen Available  Consent signed and patient instructions given. No problems after 30 minutes in the office.   Horris Latino 04/20/2018, 1:39 PM

## 2018-04-27 ENCOUNTER — Ambulatory Visit (INDEPENDENT_AMBULATORY_CARE_PROVIDER_SITE_OTHER): Payer: Medicare Other | Admitting: *Deleted

## 2018-04-27 DIAGNOSIS — J309 Allergic rhinitis, unspecified: Secondary | ICD-10-CM | POA: Diagnosis not present

## 2018-05-03 ENCOUNTER — Encounter: Payer: Self-pay | Admitting: Family Medicine

## 2018-05-05 ENCOUNTER — Ambulatory Visit (INDEPENDENT_AMBULATORY_CARE_PROVIDER_SITE_OTHER): Payer: Medicare Other | Admitting: *Deleted

## 2018-05-05 DIAGNOSIS — J309 Allergic rhinitis, unspecified: Secondary | ICD-10-CM

## 2018-05-12 ENCOUNTER — Ambulatory Visit (INDEPENDENT_AMBULATORY_CARE_PROVIDER_SITE_OTHER): Payer: Medicare Other

## 2018-05-12 DIAGNOSIS — J309 Allergic rhinitis, unspecified: Secondary | ICD-10-CM

## 2018-05-12 NOTE — Progress Notes (Signed)
0

## 2018-05-18 ENCOUNTER — Ambulatory Visit (INDEPENDENT_AMBULATORY_CARE_PROVIDER_SITE_OTHER): Payer: Medicare Other

## 2018-05-18 DIAGNOSIS — J309 Allergic rhinitis, unspecified: Secondary | ICD-10-CM

## 2018-05-26 ENCOUNTER — Ambulatory Visit (INDEPENDENT_AMBULATORY_CARE_PROVIDER_SITE_OTHER): Payer: Medicare Other | Admitting: *Deleted

## 2018-05-26 DIAGNOSIS — J309 Allergic rhinitis, unspecified: Secondary | ICD-10-CM

## 2018-06-01 ENCOUNTER — Ambulatory Visit (INDEPENDENT_AMBULATORY_CARE_PROVIDER_SITE_OTHER): Payer: Medicare Other

## 2018-06-01 DIAGNOSIS — J309 Allergic rhinitis, unspecified: Secondary | ICD-10-CM

## 2018-06-08 ENCOUNTER — Ambulatory Visit (INDEPENDENT_AMBULATORY_CARE_PROVIDER_SITE_OTHER): Payer: Medicare Other

## 2018-06-08 DIAGNOSIS — J309 Allergic rhinitis, unspecified: Secondary | ICD-10-CM

## 2018-06-15 ENCOUNTER — Ambulatory Visit (INDEPENDENT_AMBULATORY_CARE_PROVIDER_SITE_OTHER): Payer: Medicare Other | Admitting: *Deleted

## 2018-06-15 DIAGNOSIS — J309 Allergic rhinitis, unspecified: Secondary | ICD-10-CM

## 2018-06-22 ENCOUNTER — Ambulatory Visit (INDEPENDENT_AMBULATORY_CARE_PROVIDER_SITE_OTHER): Payer: Medicare Other

## 2018-06-22 DIAGNOSIS — J309 Allergic rhinitis, unspecified: Secondary | ICD-10-CM

## 2018-06-30 ENCOUNTER — Ambulatory Visit (INDEPENDENT_AMBULATORY_CARE_PROVIDER_SITE_OTHER): Payer: Medicare Other | Admitting: *Deleted

## 2018-06-30 DIAGNOSIS — J309 Allergic rhinitis, unspecified: Secondary | ICD-10-CM

## 2018-07-07 ENCOUNTER — Ambulatory Visit (INDEPENDENT_AMBULATORY_CARE_PROVIDER_SITE_OTHER): Payer: Medicare Other | Admitting: *Deleted

## 2018-07-07 DIAGNOSIS — J309 Allergic rhinitis, unspecified: Secondary | ICD-10-CM

## 2018-07-07 MED ORDER — EPINEPHRINE 0.3 MG/0.3ML IJ SOAJ: INTRAMUSCULAR | 1 refills | Status: DC

## 2018-07-07 NOTE — Progress Notes (Signed)
Patient stated she could not stay today due to her husband needing the car advised patient if she was aware of what to do in case of an allergic reaction. She stated no Probation officer showed her the emergency action plan states she never had seen that before and when she started she was not given one also asked if she had an epipen she stated she did not and no one showed her how to use one. Writer did review emergency action plan and teaching on epipen use prescription was sent in to pharmacy. Patient verbalized understanding copy of emergency action plan given to patient

## 2018-07-13 ENCOUNTER — Encounter: Payer: Self-pay | Admitting: Family Medicine

## 2018-07-13 ENCOUNTER — Ambulatory Visit: Payer: Medicare Other | Admitting: Allergy and Immunology

## 2018-07-13 ENCOUNTER — Encounter: Payer: Self-pay | Admitting: Allergy and Immunology

## 2018-07-13 VITALS — BP 108/62 | HR 98 | Resp 18

## 2018-07-13 DIAGNOSIS — G43909 Migraine, unspecified, not intractable, without status migrainosus: Secondary | ICD-10-CM

## 2018-07-13 DIAGNOSIS — M810 Age-related osteoporosis without current pathological fracture: Secondary | ICD-10-CM | POA: Diagnosis not present

## 2018-07-13 DIAGNOSIS — K219 Gastro-esophageal reflux disease without esophagitis: Secondary | ICD-10-CM | POA: Diagnosis not present

## 2018-07-13 DIAGNOSIS — M8589 Other specified disorders of bone density and structure, multiple sites: Secondary | ICD-10-CM | POA: Diagnosis not present

## 2018-07-13 DIAGNOSIS — J3089 Other allergic rhinitis: Secondary | ICD-10-CM | POA: Diagnosis not present

## 2018-07-13 NOTE — Patient Instructions (Addendum)
  1.  Continue immunotherapy and Epi-Pen  2.  Continue Claritin / loratadine 10mg  daily  3.  Use OTC Nasacort / Rhinocort 1 spray each nostril 3-7 times per week  4. Continue therapy for reflux including apple cider vinegar  5.  Continue to minimize consumption of chocolate and all forms of caffeine  6.  Obtain fall flu vaccine  7.  Return to clinic in 6 months or earlier if problem

## 2018-07-13 NOTE — Progress Notes (Signed)
Follow-up Note  Referring Provider: Wardell Honour, MD Primary Provider: Wardell Honour, MD Date of Office Visit: 07/13/2018  Subjective:   Anne Mills (DOB: Jun 24, 1946) is a 72 y.o. female who returns to the [facility] on 07/13/2018 in re-evaluation of the following:  HPI: Anne Mills returns to this clinic in reevaluation of her allergic rhinitis and reflux and migraine syndrome.  I last saw her in this clinic on 19 January 2018.  Overall she thinks she is doing relatively well at this point in time.  She still occasionally has some intermittent nasal congestion and head fullness.  She could not afford to use Dymista.  She does use Claritin at this point in time because montelukast gave her problems with cognitive function.  She has not required a systemic steroid or antibiotic to treat any type of upper respiratory tract issue.  She does have a very distant history of asthma that has been inactive for years.  Her reflux is under excellent control.  Currently she is using apple cider vinegar supplements.  Her headaches are under good control however if she has exposure to strong chemical smells such as the detergent aisle at the grocery store she will sometimes get a headache.  She is undergoing a course of immunotherapy without any adverse effect.  Currently she is using this form of therapy every week.  Allergies as of 07/13/2018      Reactions   Gluten Meal Cough   Biaxin [clarithromycin] Nausea And Vomiting      Medication List      ASPERCREME EX Apply 1 application topically 2 (two) times daily as needed (for back pain).   CALCIUM 600 + D PO Take 1 tablet by mouth daily.   CALCIUM PO Take 1,200 mg by mouth.   Camphor-Menthol-Methyl Sal 1.2-5.7-6.3 % Ptch Apply topically.   Cholecalciferol 2000 units Tabs Take 1 tablet by mouth daily.   EPINEPHrine 0.3 mg/0.3 mL Soaj injection Commonly known as:  EPIPEN 2-PAK Use as directed for severe  allergic reaction   EVENING PRIMROSE OIL PO Take 1,300 mg by mouth.   FISH OIL PO Take by mouth.   gabapentin 300 MG capsule Commonly known as:  NEURONTIN Take 300 mg by mouth 3 (three) times daily.   GELATIN PO Take 1,300 mg by mouth.   loratadine 10 MG tablet Commonly known as:  CLARITIN Take 10 mg by mouth daily.   Magnesium 500 MG Caps Take by mouth.   NAPROXEN PO Take by mouth as needed.   OSTEO BI-FLEX TRIPLE STRENGTH Tabs Take 2 tablets by mouth daily.   OVER THE COUNTER MEDICATION Transitions w/Hesperidin vegetarian taking   OVER THE COUNTER MEDICATION Wal-Finate Dw/Pseudoephedrine taking   PROBIOTIC PO Take by mouth. 10 Billion Active Cultures   PSEUDOEPHED-CHLORPHEN-DM-GG PO Take by mouth.   STRESS TAB NF Tabs Take 1 tablet by mouth daily.   VITAMIN B-COMPLEX PO Take by mouth. Liquid       Past Medical History:  Diagnosis Date  . Allergy   . Asthma    no inhaler  . Back pain, chronic   . Chest pain    under left breast- 2-3 x per week: h/o breast surgery  . GERD (gastroesophageal reflux disease)   . Headache(784.0)   . Heart murmur    "not serious" per pt-no echo  . Scoliosis   . Seasonal allergies   . Shortness of breath    per pt due to allergies  Past Surgical History:  Procedure Laterality Date  . ABDOMINOPLASTY    . BREAST SURGERY     lt cyst  . COLONOSCOPY    . DILATION AND CURETTAGE OF UTERUS N/A 03/03/2013   Procedure: DILATATION AND CURETTAGE;  Surgeon: Lahoma Crocker, MD;  Location: [facility];  Service: Gynecology;  Laterality: N/A;  . EXAMINATION UNDER ANESTHESIA N/A 03/03/2013   Procedure: EXAM UNDER ANESTHESIA with ultrasound;  Surgeon: Lahoma Crocker, MD;  Location: [facility];  Service: Gynecology;  Laterality: N/A;  . TRIGGER FINGER RELEASE Left 06/21/2014   Procedure: LEFT THUMB TRIGGER RELEASE;  Surgeon: Tennis Must, MD;  Location: [facility];  Service: Orthopedics;  Laterality: Left;     Review of systems negative except as noted in HPI / PMHx or noted below:  Review of Systems  Constitutional: Negative.   HENT: Negative.   Eyes: Negative.   Respiratory: Negative.   Cardiovascular: Negative.   Gastrointestinal: Negative.   Genitourinary: Negative.   Musculoskeletal: Negative.   Skin: Negative.   Neurological: Negative.   Endo/Heme/Allergies: Negative.   Psychiatric/Behavioral: Negative.      Objective:   Vitals:   07/13/18 1056  BP: 108/62  Pulse: 98  Resp: 18  SpO2: 98%          Physical Exam  HENT:  Head: Normocephalic.  Right Ear: Tympanic membrane, external ear and ear canal normal.  Left Ear: Tympanic membrane, external ear and ear canal normal.  Nose: Nose normal. No mucosal edema or rhinorrhea.  Mouth/Throat: Uvula is midline, oropharynx is clear and moist and mucous membranes are normal. No oropharyngeal exudate.  Eyes: Conjunctivae are normal.  Neck: Trachea normal. No tracheal tenderness present. No tracheal deviation present. No thyromegaly present.  Cardiovascular: Normal rate, regular rhythm, S1 normal, S2 normal and normal heart sounds.  No murmur heard. Pulmonary/Chest: Breath sounds normal. No stridor. No respiratory distress. She has no wheezes. She has no rales.  Musculoskeletal: She exhibits no edema.  Lymphadenopathy:       Head (right side): No tonsillar adenopathy present.       Head (left side): No tonsillar adenopathy present.    She has no cervical adenopathy.  Neurological: She is alert.  Skin: No rash noted. She is not diaphoretic. No erythema. Nails show no clubbing.    Diagnostics: none  Assessment and Plan:   1. Perennial allergic rhinitis   2. Gastroesophageal reflux disease, esophagitis presence not specified   3. Migraine syndrome     1.  Continue immunotherapy and Epi-Pen  2.  Continue Claritin / loratadine 10mg  daily  3.  Use OTC Nasacort / Rhinocort 1 spray each nostril 3-7 times per week  4.  Continue therapy for reflux including apple cider vinegar  5.  Continue to minimize consumption of chocolate and all forms of caffeine  6.  Obtain fall flu vaccine  7.  Return to clinic in 6 months or earlier if problem  Anne Mills appears to be doing relatively well at this point in time.  She will continue on immunotherapy and an antihistamine and use a nasal steroid and continue on her therapy for reflux.  I will see her back in this clinic in 6 months or earlier if there is a problem.  Allena Katz, MD Allergy / Immunology [place]

## 2018-07-14 ENCOUNTER — Encounter: Payer: Self-pay | Admitting: Allergy and Immunology

## 2018-07-19 DIAGNOSIS — M419 Scoliosis, unspecified: Secondary | ICD-10-CM | POA: Diagnosis not present

## 2018-07-19 DIAGNOSIS — M545 Low back pain: Secondary | ICD-10-CM | POA: Diagnosis not present

## 2018-07-19 DIAGNOSIS — M549 Dorsalgia, unspecified: Secondary | ICD-10-CM | POA: Diagnosis not present

## 2018-07-19 DIAGNOSIS — G894 Chronic pain syndrome: Secondary | ICD-10-CM | POA: Diagnosis not present

## 2018-07-21 ENCOUNTER — Ambulatory Visit (INDEPENDENT_AMBULATORY_CARE_PROVIDER_SITE_OTHER): Payer: Medicare Other | Admitting: *Deleted

## 2018-07-21 DIAGNOSIS — J309 Allergic rhinitis, unspecified: Secondary | ICD-10-CM | POA: Diagnosis not present

## 2018-07-28 ENCOUNTER — Ambulatory Visit (INDEPENDENT_AMBULATORY_CARE_PROVIDER_SITE_OTHER): Payer: Medicare Other | Admitting: *Deleted

## 2018-07-28 DIAGNOSIS — J309 Allergic rhinitis, unspecified: Secondary | ICD-10-CM | POA: Diagnosis not present

## 2018-08-04 ENCOUNTER — Ambulatory Visit (INDEPENDENT_AMBULATORY_CARE_PROVIDER_SITE_OTHER): Payer: Medicare Other | Admitting: *Deleted

## 2018-08-04 DIAGNOSIS — J309 Allergic rhinitis, unspecified: Secondary | ICD-10-CM

## 2018-08-18 ENCOUNTER — Ambulatory Visit (INDEPENDENT_AMBULATORY_CARE_PROVIDER_SITE_OTHER): Payer: Medicare Other | Admitting: *Deleted

## 2018-08-18 DIAGNOSIS — J309 Allergic rhinitis, unspecified: Secondary | ICD-10-CM

## 2018-08-24 ENCOUNTER — Telehealth: Payer: Self-pay | Admitting: Allergy and Immunology

## 2018-08-24 NOTE — Telephone Encounter (Signed)
Patient had an allergy injection It hurt going in and later that night Patient can barely move arm today because it is sore Please call patient to answer any questions

## 2018-08-24 NOTE — Telephone Encounter (Signed)
Patient has an appt 08/26/2018

## 2018-08-24 NOTE — Telephone Encounter (Signed)
Patient had an allergy infection on 08/18/18 medication didn't cause any pain during that time, later that evening patient was still experiencing  pain up till this day.  Difficult moving arm but patient is still moving fight through pain in the right arm. Pain is described as sharp, ache, stabbing feeling. 7/10 pain level.

## 2018-08-26 ENCOUNTER — Ambulatory Visit: Payer: Medicare Other | Admitting: Allergy

## 2018-08-26 ENCOUNTER — Encounter: Payer: Self-pay | Admitting: Allergy

## 2018-08-26 VITALS — BP 108/62 | HR 77 | Resp 16 | Ht 61.0 in | Wt 120.6 lb

## 2018-08-26 DIAGNOSIS — T450X5A Adverse effect of antiallergic and antiemetic drugs, initial encounter: Secondary | ICD-10-CM | POA: Diagnosis not present

## 2018-08-26 DIAGNOSIS — T8089XA Other complications following infusion, transfusion and therapeutic injection, initial encounter: Secondary | ICD-10-CM | POA: Diagnosis not present

## 2018-08-26 NOTE — Progress Notes (Signed)
Follow-up Note  RE: Anne Mills MRN: [id number] DOB: [birth year] Date of Office Visit: 08/26/2018   History of present illness: Anne Mills is a 72 y.o. female presenting today for the development of right arm pain after receiving an allergy shot.  History obtained by Dr. Sherry Ruffing, internal medicine resident, and the confirmed by myself with patient.  She gets allergy shots once a week; she got one last Thursday and it was put in the back of her arm instead of where she normally gets it in the side of her arm. She has been having right arm pain since then, has had some pain down her forearm, reports it as a 8/10. She reports that the pain has improved a little bit but she is still having pain. Whole upper right arm is painful but it is mostly painful on the posterior aspect, feels like somebody punched her there, she also gets a stabbing pain in that area when she lifts her arm straight up in front of her. She reports that she feels some numbness on her right 5th finger. She denies any weakness in her arm or fingers.  She took 2 naproxen which she states helped once in the past week since pain started. She tried aspercreme, which she takes for back pain, on her arm but she reports that it didn't help. Has never had this before.   She received in the right arm on 08/18/2017 green vial 0.4 mL of mold  Review of systems: Review of Systems  Constitutional: Negative for chills, fever and malaise/fatigue.  HENT: Negative for congestion, ear discharge, nosebleeds and sore throat.   Eyes: Negative for pain, discharge and redness.  Respiratory: Negative for cough, shortness of breath and wheezing.   Cardiovascular: Negative for chest pain.  Gastrointestinal: Negative for abdominal pain, constipation, diarrhea, heartburn, nausea and vomiting.  Musculoskeletal: Negative for joint pain.  Skin: Negative for itching and rash.  Neurological: Negative for headaches.    All other systems negative  unless noted above in HPI  Past medical/social/surgical/family history have been reviewed and are unchanged unless specifically indicated below.  No changes  Medication List: Allergies as of 08/26/2018      Reactions   Gluten Meal Cough   Biaxin [clarithromycin] Nausea And Vomiting      Medication List        Accurate as of 08/26/18  5:47 PM. Always use your most recent med list.          ASPERCREME EX Apply 1 application topically 2 (two) times daily as needed (for back pain).   CALCIUM 600 + D PO Take 1 tablet by mouth daily.   CALCIUM PO Take 1,200 mg by mouth.   Camphor-Menthol-Methyl Sal 1.2-5.7-6.3 % Ptch Apply topically.   Cholecalciferol 2000 units Tabs Take 1 tablet by mouth daily.   EPINEPHrine 0.3 mg/0.3 mL Soaj injection Commonly known as:  EPI-PEN Use as directed for severe allergic reaction   EVENING PRIMROSE OIL PO Take 1,300 mg by mouth.   FISH OIL PO Take by mouth.   gabapentin 300 MG capsule Commonly known as:  NEURONTIN Take 300 mg by mouth 3 (three) times daily.   GELATIN PO Take 1,300 mg by mouth.   loratadine 10 MG tablet Commonly known as:  CLARITIN Take 10 mg by mouth daily.   Magnesium 500 MG Caps Take by mouth.   NAPROXEN PO Take by mouth as needed.   OSTEO BI-FLEX TRIPLE STRENGTH Tabs Take 2 tablets by  mouth daily.   OVER THE COUNTER MEDICATION Transitions w/Hesperidin vegetarian taking   OVER THE COUNTER MEDICATION Wal-Finate Dw/Pseudoephedrine taking   PROBIOTIC PO Take by mouth. 10 Billion Active Cultures   PSEUDOEPHED-CHLORPHEN-DM-GG PO Take by mouth.   STRESS TAB NF Tabs Take 1 tablet by mouth daily.   VITAMIN B-COMPLEX PO Take by mouth. Liquid       Known medication allergies: Allergies  Allergen Reactions  . Gluten Meal Cough  . Biaxin [Clarithromycin] Nausea And Vomiting     Physical examination: Blood pressure 108/62, pulse 77, resp. rate 16, height 5\' 1"  (1.549 m), weight 120 lb 9.6 oz  (54.7 kg), SpO2 98 %.  General: Alert, interactive, in no acute distress. HEENT: turbinates non-edematous without discharge, post-pharynx non erythematous. Neck: Supple without lymphadenopathy. Lungs: Clear to auscultation without wheezing, rhonchi or rales. {no increased work of breathing. CV: Normal S1, S2 without murmurs. Abdomen: Nondistended, nontender. Skin: Warm and dry, without lesions or rashes. Extremities:  No clubbing, cyanosis or edema.  Right upper arm without any palpable swelling or mass.  She is able to make a fist without issue.  She has good grip strength.  She has good sensation over the upper arm and forearm as well as hand.  She is able to passively raise her arm up to her ear level Neuro:   Grossly intact.  Diagnositics/Labs: none   Assessment and plan: Patient Instructions  Side effect to allergen injection   -It appears that she may have had an improper placement of her injection that reached the muscle.  Given the degree of muscular pain and difficulty in movement of the arm I believe that the extract may have been delivered into the muscle.   -I have asked that she continue to take naproxen throughout the weekend once a day.  I have asked that she apply heat pack to the area as well as to keep moving the arm as much as possible   She will keep her routine follow-up  I appreciate the opportunity to take part in Anne Mills's care. Please do not hesitate to contact me with questions.  Sincerely,   Prudy Feeler, MD Allergy/Immunology [facility]

## 2018-08-26 NOTE — Patient Instructions (Signed)
Side effect to allergen injection   -It appears that she may have had an improper placement of her injection that reached the muscle.  Given the degree of muscular pain and difficulty in movement of the arm I believe that the extract may have been delivered into the muscle.   -I have asked that she continue to take naproxen throughout the weekend once a day.  I have asked that she apply heat pack to the area as well as to keep moving the arm as much as possible  She will keep her routine follow-up

## 2018-09-01 ENCOUNTER — Ambulatory Visit (INDEPENDENT_AMBULATORY_CARE_PROVIDER_SITE_OTHER): Payer: Medicare Other | Admitting: *Deleted

## 2018-09-01 DIAGNOSIS — J309 Allergic rhinitis, unspecified: Secondary | ICD-10-CM

## 2018-09-08 ENCOUNTER — Ambulatory Visit (INDEPENDENT_AMBULATORY_CARE_PROVIDER_SITE_OTHER): Payer: Medicare Other | Admitting: *Deleted

## 2018-09-08 DIAGNOSIS — J309 Allergic rhinitis, unspecified: Secondary | ICD-10-CM

## 2018-09-15 ENCOUNTER — Ambulatory Visit (INDEPENDENT_AMBULATORY_CARE_PROVIDER_SITE_OTHER): Payer: Medicare Other | Admitting: *Deleted

## 2018-09-15 DIAGNOSIS — J309 Allergic rhinitis, unspecified: Secondary | ICD-10-CM | POA: Diagnosis not present

## 2018-09-22 ENCOUNTER — Ambulatory Visit (INDEPENDENT_AMBULATORY_CARE_PROVIDER_SITE_OTHER): Payer: Medicare Other | Admitting: *Deleted

## 2018-09-22 DIAGNOSIS — J309 Allergic rhinitis, unspecified: Secondary | ICD-10-CM

## 2018-09-29 ENCOUNTER — Ambulatory Visit (INDEPENDENT_AMBULATORY_CARE_PROVIDER_SITE_OTHER): Payer: Medicare Other

## 2018-09-29 DIAGNOSIS — J309 Allergic rhinitis, unspecified: Secondary | ICD-10-CM | POA: Diagnosis not present

## 2018-10-06 ENCOUNTER — Ambulatory Visit (INDEPENDENT_AMBULATORY_CARE_PROVIDER_SITE_OTHER): Payer: Medicare Other | Admitting: *Deleted

## 2018-10-06 DIAGNOSIS — J309 Allergic rhinitis, unspecified: Secondary | ICD-10-CM

## 2018-10-13 ENCOUNTER — Ambulatory Visit (INDEPENDENT_AMBULATORY_CARE_PROVIDER_SITE_OTHER): Payer: Medicare Other | Admitting: *Deleted

## 2018-10-13 DIAGNOSIS — J309 Allergic rhinitis, unspecified: Secondary | ICD-10-CM | POA: Diagnosis not present

## 2018-10-17 ENCOUNTER — Ambulatory Visit (INDEPENDENT_AMBULATORY_CARE_PROVIDER_SITE_OTHER): Payer: Medicare Other

## 2018-10-17 DIAGNOSIS — Z23 Encounter for immunization: Secondary | ICD-10-CM | POA: Diagnosis not present

## 2018-10-19 ENCOUNTER — Ambulatory Visit (INDEPENDENT_AMBULATORY_CARE_PROVIDER_SITE_OTHER): Payer: Medicare Other

## 2018-10-19 DIAGNOSIS — J309 Allergic rhinitis, unspecified: Secondary | ICD-10-CM

## 2018-10-25 DIAGNOSIS — J3089 Other allergic rhinitis: Secondary | ICD-10-CM | POA: Diagnosis not present

## 2018-10-25 NOTE — Progress Notes (Signed)
Vial exp 10-27-19

## 2018-10-27 ENCOUNTER — Ambulatory Visit (INDEPENDENT_AMBULATORY_CARE_PROVIDER_SITE_OTHER): Payer: Medicare Other | Admitting: *Deleted

## 2018-10-27 DIAGNOSIS — J309 Allergic rhinitis, unspecified: Secondary | ICD-10-CM

## 2018-11-02 ENCOUNTER — Other Ambulatory Visit: Payer: Self-pay

## 2018-11-02 ENCOUNTER — Encounter: Payer: Self-pay | Admitting: Family Medicine

## 2018-11-02 ENCOUNTER — Ambulatory Visit (INDEPENDENT_AMBULATORY_CARE_PROVIDER_SITE_OTHER): Payer: Medicare Other | Admitting: Family Medicine

## 2018-11-02 VITALS — BP 104/65 | HR 77 | Temp 98.8°F | Ht 61.0 in | Wt 121.0 lb

## 2018-11-02 DIAGNOSIS — Z23 Encounter for immunization: Secondary | ICD-10-CM | POA: Diagnosis not present

## 2018-11-02 DIAGNOSIS — M7711 Lateral epicondylitis, right elbow: Secondary | ICD-10-CM | POA: Diagnosis not present

## 2018-11-02 DIAGNOSIS — Z1211 Encounter for screening for malignant neoplasm of colon: Secondary | ICD-10-CM | POA: Diagnosis not present

## 2018-11-02 DIAGNOSIS — M722 Plantar fascial fibromatosis: Secondary | ICD-10-CM | POA: Diagnosis not present

## 2018-11-02 MED ORDER — IBUPROFEN 600 MG PO TABS: 600.0000 mg | ORAL_TABLET | Freq: Three times a day (TID) | ORAL | 0 refills | Status: DC | PRN

## 2018-11-02 NOTE — Progress Notes (Signed)
11/27/201911:26 AM  Anne Mills October 26, 1946, 72 y.o. female [id number]  Chief Complaint  Patient presents with  . Pain    shoulder and ankle pain for a while now. Requesting colonoscopy referral    HPI:   Patient is a 72 y.o. female who presents today for right arm pain  Had a fall several month, slipped in her kitchen Hurt right elbow, had bad bruise in her elbow, but since then has also had pain that goes up her arm into her shoulder, hurts more when she extends her shoulder like when she its trying to reach or clean Also having numbness, tingling of her hands with dropping objects, worse since her fall  She is also c/o right foot pain, arch, worse when she stands after sitting for a while Xray 2018 of right foot and ankle, negative   Fall Risk  11/02/2018 02/16/2018 02/16/2018 09/15/2017 07/21/2017  Falls in the past year? 0 No No No No     Depression screen [facility] 2/9 11/02/2018 02/16/2018 02/16/2018  Decreased Interest 0 0 0  Down, Depressed, Hopeless 0 0 0  PHQ - 2 Score 0 0 0    Allergies  Allergen Reactions  . Gluten Meal Cough  . Biaxin [Clarithromycin] Nausea And Vomiting    Prior to Admission medications   Medication Sig Start Date End Date Taking? Authorizing Provider  B Complex Vitamins (VITAMIN B-COMPLEX PO) Take by mouth. Liquid   Yes [provider]  Calcium Carbonate-Vitamin D (CALCIUM 600 + D PO) Take 1 tablet by mouth daily.   Yes [provider]  CALCIUM PO Take 1,200 mg by mouth.   Yes [provider]  Camphor-Menthol-Methyl Sal 1.2-5.7-6.3 % PTCH Apply topically.   Yes [provider]  Cholecalciferol 2000 UNITS TABS Take 1 tablet by mouth daily.   Yes [provider]  EPINEPHrine (EPIPEN 2-PAK) 0.3 mg/0.3 mL IJ SOAJ injection Use as directed for severe allergic reaction 07/07/18  Yes Kozlow, [name], MD  EVENING PRIMROSE OIL PO Take 1,300 mg by mouth.   Yes [provider]  gabapentin (NEURONTIN)  300 MG capsule Take 300 mg by mouth 3 (three) times daily.   Yes [provider]  GELATIN PO Take 1,300 mg by mouth.   Yes [provider]  loratadine (CLARITIN) 10 MG tablet Take 10 mg by mouth daily.   Yes [provider]  Magnesium 500 MG CAPS Take by mouth.   Yes [provider]  Misc Natural Products (OSTEO BI-FLEX TRIPLE STRENGTH) TABS Take 2 tablets by mouth daily.   Yes [provider]  Multiple Vitamins-Minerals (STRESS TAB NF) TABS Take 1 tablet by mouth daily.   Yes [provider]  NAPROXEN PO Take by mouth as needed.   Yes [provider]  Omega-3 Fatty Acids (FISH OIL PO) Take by mouth.   Yes [provider]  OVER THE COUNTER MEDICATION Transitions w/Hesperidin vegetarian taking   Yes [provider]  OVER THE COUNTER MEDICATION Wal-Finate Dw/Pseudoephedrine taking   Yes [provider]  Probiotic Product (PROBIOTIC PO) Take by mouth. 10 Billion Active Cultures   Yes [provider]  PSEUDOEPHED-CHLORPHEN-DM-GG PO Take by mouth.   Yes [provider]  Trolamine Salicylate (ASPERCREME EX) Apply 1 application topically 2 (two) times daily as needed (for back pain).   Yes [provider]    Past Medical History:  Diagnosis Date  . Allergy   . Asthma    no inhaler  .  Back pain, chronic   . Chest pain    under left breast- 2-3 x per week: h/o breast surgery  . GERD (gastroesophageal reflux disease)   . Headache(784.0)   . Heart murmur    "not serious" per pt-no echo  . Scoliosis   . Seasonal allergies   . Shortness of breath    per pt due to allergies    Past Surgical History:  Procedure Laterality Date  . ABDOMINOPLASTY    . BREAST SURGERY     lt cyst  . COLONOSCOPY    . DILATION AND CURETTAGE OF UTERUS N/A 03/03/2013   Procedure: DILATATION AND CURETTAGE;  Surgeon: Lahoma Crocker, MD;  Location: [facility];  Service: Gynecology;  Laterality: N/A;  .  EXAMINATION UNDER ANESTHESIA N/A 03/03/2013   Procedure: EXAM UNDER ANESTHESIA with ultrasound;  Surgeon: Lahoma Crocker, MD;  Location: [facility];  Service: Gynecology;  Laterality: N/A;  . TRIGGER FINGER RELEASE Left 06/21/2014   Procedure: LEFT THUMB TRIGGER RELEASE;  Surgeon: Tennis Must, MD;  Location: [facility];  Service: Orthopedics;  Laterality: Left;    Social History   Tobacco Use  . Smoking status: Never Smoker  . Smokeless tobacco: Never Used  Substance Use Topics  . Alcohol use: No    Family History  Problem Relation Age of Onset  . Dementia Mother   . Hypertension Mother   . Cancer Mother 35       Breast cancer  . Depression Mother   . Cancer Sister   . Diabetes Sister   . Heart disease Sister 12       AMI as cause of death  . Hyperlipidemia Sister   . Hypertension Sister   . Mental retardation Sister   . Cancer Father        prostate cancer  . Heart disease Sister 72       AMI  . Diabetes Sister     ROS Per hpi  OBJECTIVE:  Blood pressure 104/65, pulse 77, temperature 98.8 F (37.1 C), temperature source Oral, height 5\' 1"  (1.549 m), weight 121 lb (54.9 kg), SpO2 98 %. Body mass index is 22.86 kg/m.   Physical Exam Gen: AAOX3, NAD RUE: shoulder FROM, TTP over lateral deltoid. Neg drop arch., hakins, lift off, neers. Elbow; FROM, TTP with mild swelling over lateral epicondyle Neg elbow flexion test and elbow tinel.  Wrist: FROM, non tender, Neg wrist tinel and phalen Normal strength and sensation Right FOOT: FROM, no swelling, TTP over distal arch  No results found for this or any previous visit (from the past 24 hour(s)).  No results found.   ASSESSMENT and PLAN  1. Right lateral epicondylitis 2. Plantar fasciitis of right foot Discussed supportive measures, new meds r/se/b and RTC precautions. Patient educational handout given.  3. Need for vaccination against Streptococcus pneumoniae using pneumococcal conjugate  vaccine 13  4. Screening for colon cancer - Ambulatory referral to Gastroenterology  Other orders - ibuprofen (ADVIL,MOTRIN) 600 MG tablet; Take 1 tablet (600 mg total) by mouth every 8 (eight) hours as needed.  Return in about 6 weeks (around 12/14/2018).    [name], MD Primary Care at [place], [place] [address] Ph.  [phone] Fax [phone]

## 2018-11-02 NOTE — Patient Instructions (Addendum)
If you have lab work done today you will be contacted with your lab results within the next 2 weeks.  If you have not heard from [place] then please contact us. The fastest way to get your results is to register for My Chart.   IF you received an x-ray today, you will receive an invoice from [facility]. Please contact [facility] at [phone] with questions or concerns regarding your invoice.   IF you received labwork today, you will receive an invoice from [place]. Please contact [facility] at [phone] with questions or concerns regarding your invoice.   Our billing staff will not be able to assist you with questions regarding bills from these companies.  You will be contacted with the lab results as soon as they are available. The fastest way to get your results is to activate your My Chart account. Instructions are located on the last page of this paperwork. If you have not heard from [place] regarding the results in 2 weeks, please contact this office.     Fascitis plantar con rehabilitacin Plantar Fasciitis Rehab Consulte al mdico qu ejercicios son seguros para usted. Haga los ejercicios exactamente como se lo haya indicado el mdico y gradelos como se lo hayan indicado. Es normal sentir tirantez, tensin, presin o molestias leves mientras hace estos ejercicios, pero debe detenerse de inmediato si siente dolor repentino o si el dolor empeora. No comience a hacer estos ejercicios hasta que se lo indique el mdico. EJERCICIOS DE [employer] Y AMPLITUD DE MOVIMIENTOS Estos ejercicios calientan los msculos y las articulaciones, y mejoran la movilidad y la flexibilidad del pie. Adems, ayudan a Best boy. EjercicioA: Estiramiento de la fascia plantar 1. Sintese con la pierna izquierda/derecha cruzada sobre la rodilla opuesta. 2. Sostenga el taln con [place], con el pulgar cerca del arco. Con la otra mano, sostenga los dedos de los pies y empjelos con  [place]. Debe sentir un estiramiento en la parte de abajo de los dedos o del pie, o de [place]. 3. Mantenga esta posicin durante _________ segundos. 4. Afloje lentamente los dedos y vuelva a la posicin inicial. Repita __________ veces. Realice este ejercicio __________ veces al da. EjercicioB: Msculos gemelos, de pie 1. Prese con las [employer] pared. 2. Extienda la pierna izquierda/derecha hacia atrs y flexione ligeramente la rodilla de la pierna de adelante. 3. Mantenga los talones en el suelo y la rodilla de atrs extendida, y pase el peso hacia la pared, sin arquear la espalda. Debe sentir un estiramiento suave en la pantorrilla izquierda/derecha. 4. Mantenga esta posicin durante ___________ segundos. Repita __________ veces. Realice este ejercicio __________ veces al da. EjercicioC: Msculo sleo, de pie 1. Prese con las [employer] pared. 2. Extienda la pierna izquierda/derecha hacia atrs y flexione ligeramente la rodilla de la pierna de adelante. 3. Mantenga los talones apoyados en el suelo, flexione la rodilla de atrs y lleve el peso ligeramente a la pierna de atrs. Debe sentir un estiramiento suave en la parte profunda de la pantorrilla. 4. Mantenga esta posicin durante ___________ segundos. Repita __________ veces. Realice este ejercicio __________ veces al da. EjercicioD: Msculos gemelos y sleo, de pie 1. Prese sobre un escaln apoyando solo la regin metatarsiana de su pie derecho/izquierdo. La parte metatarsiana de la planta del pie es la superficie sobre la que caminamos, justo debajo de los dedos. 2. Mantenga el otro pie apoyado con firmeza en el mismo escaln. 3. Sostngase de la  pared o de una baranda para mantener el equilibrio. 4. Levante lentamente el Google, y permita que el peso del cuerpo presione el taln sobre el borde del escaln. Debe sentir un estiramiento en la pantorrilla izquierda/derecha. 5. Mantenga esta  posicin durante ___________ segundos. 6. Vuelva a poner ambos pies sobre el escaln. 7. Repita este ejercicio con una leve flexin en la rodilla izquierda/derecha. Reptalo __________ veces con la rodilla izquierda/derecha extendida y __________ veces con la rodilla izquierda/derecha flexionada. Realice este ejercicio __________ veces al da. EJERCICIO DE EQUILIBRIO Este ejercicio aumenta el equilibrio y el control de la fuerza del arco, para ayudar a reducir la presin sobre la fascia plantar. EjercicioE: Pararse en una sola pierna 1. Sin calzado, prese cerca de una baranda o [place]. Puede sostenerse de la baranda o del marco de la puerta, segn lo necesite. 2. Prese sobre el pie izquierdo/derecho. Sin despegar el dedo gordo del suelo, intente mantener el arco levantado. No deje que el pie se vaya hacia adentro. 3. Mantenga esta posicin durante ___________ segundos. 4. Si este ejercicio es muy fcil, puede intentar [employer] con los ojos cerrados o parado sobre [place]. Repita __________ veces. Realice este ejercicio __________ veces al da. Esta informacin no tiene Marine scientist el consejo del mdico. Asegrese de hacerle al mdico cualquier pregunta que tenga. Document Released: 09/09/2006 Document Revised: 04/09/2015 Document Reviewed: 10/07/2015 Elsevier Interactive Patient Education  2018 [place] con rehabilitacin Tennis Elbow Rehab Consulte al mdico qu ejercicios son seguros para usted. Haga los ejercicios exactamente como se lo haya indicado el mdico y gradelos como se lo hayan indicado. Es normal sentir tirantez, tensin, presin o molestias leves mientras hace estos ejercicios, pero debe detenerse de inmediato si siente dolor repentino o si el dolor empeora. No comience a hacer estos ejercicios hasta que se lo indique el mdico. EJERCICIOS DE Fis[employer] Y AMPLITUD DE MOVIMIENTOS Estos ejercicios calientan los msculos y las articulaciones,  y mejoran la movilidad y la flexibilidad del codo. Estos ejercicios tambin ayudan a Best boy, el adormecimiento y el hormigueo. EjercicioA: Estiramiento del extensor de la mueca 5. Extienda el codo izquierdo/derecho con los dedos apuntando hacia abajo. 6. Tire suavemente la palma de la mano izquierda/derecha hacia usted hasta sentir un ligero estiramiento en la parte superior del antebrazo. 7. Para intensificar el estiramiento, lleve la mano izquierda/derecha hacia el lado del meique o el borde externo del Management consultant. 8. Mantenga esta posicin durante ___________ segundos. Repita __________ veces. Realice este ejercicio __________ veces al da. Si se lo indic el mdico, repita el estiramiento, pero esta vez con el codo flexionado. EjercicioB: Estiramiento del flexor de la mueca 5. Extienda el codo izquierdo/derecho y gire la palma [place] arriba. 6. Empuje suavemente con la punta de los dedos y la palma izquierda/derecha hacia atrs, de modo que la [place] se extienda y los dedos apunten hacia el suelo. 7. Debe sentir un ligero estiramiento en la parte interior del antebrazo. 8. Mantenga esta posicin durante ___________ segundos. Repita __________ veces. Realice este ejercicio __________ veces al da. Si se lo indic el mdico, repita el estiramiento, pero esta vez con el codo flexionado. EJERCICIOS DE FORTALECIMIENTO Estos ejercicios fortalecen el codo y le otorgan resistencia. La resistencia es la capacidad de usar los msculos durante un tiempo prolongado, incluso despus de que se cansen. EjercicioC: Extensores de Advertising copywriter 5. Sintese con el antebrazo izquierdo/derecho con la palma hacia abajo y completamente apoyado sobre una mesa o  encimera. El codo debe estar en reposo y a la altura de los hombros. 6. Deje que la mueca izquierda/derecha se extienda sobre el borde de la superficie. 7. Sostenga sin apretar una pesa de __________ Merla Riches banda de goma para ejercicios en la mano  izquierda/derecha. Doble lentamente la mano izquierda/derecha hacia el antebrazo. Si est usando una banda, sostngala en el lugar con la otra mano, para aumentar la resistencia. 8. Mantenga esta posicin durante ___________ segundos. 9. Vuelva lentamente a la posicin inicial. Repita __________ veces. Realice este ejercicio __________ veces al da. EjercicioD: Desviaciones radiales 8. Prese con una pesa de __________ en la mano izquierda/derecha. O bien, sintese sosteniendo una banda de goma para ejercicios con el otro brazo apoyado en una mesa o encimera. Ubique la mano de forma que el dedo pulgar [place]. 9. Eleve la mano hacia el frente de forma que el dedo pulgar apunte hacia el antebrazo o tire hacia arriba la banda de goma. 10. Mantenga esta posicin durante ___________ segundos. 11. Vuelva lentamente a la posicin inicial. Repita __________ veces. Realice este ejercicio __________ veces al da. EjercicioE: Extensores de Advertising copywriter, ejercicio excntrico 5. Sintese con el antebrazo izquierdo/derecho con la palma hacia abajo y completamente apoyado sobre una mesa o encimera. El codo debe estar en reposo y a la altura de los hombros. 6. Si el mdico se lo ha indicado, sostenga una pesa de __________ [employer]. 7. Deje que la mueca izquierda/derecha se extienda sobre el borde de la superficie. 8. Use la otra mano para subir la mano izquierda/derecha hacia el [place]. Mantenga el antebrazo en la mesa. 9. Use solo los msculos de la mano izquierda/derecha para bajar lentamente la mano a la posicin inicial. Repita __________ veces. Realice este ejercicio __________ veces al da. Esta informacin no tiene Marine scientist el consejo del mdico. Asegrese de hacerle al mdico cualquier pregunta que tenga. Document Released: 09/09/2006 Document Revised: 04/09/2015 Document Reviewed: 08/22/2015 Elsevier Interactive Patient Education  [employer].

## 2018-11-09 ENCOUNTER — Ambulatory Visit (INDEPENDENT_AMBULATORY_CARE_PROVIDER_SITE_OTHER): Payer: Medicare Other | Admitting: *Deleted

## 2018-11-09 DIAGNOSIS — J309 Allergic rhinitis, unspecified: Secondary | ICD-10-CM

## 2018-11-18 ENCOUNTER — Ambulatory Visit (INDEPENDENT_AMBULATORY_CARE_PROVIDER_SITE_OTHER): Payer: Medicare Other | Admitting: *Deleted

## 2018-11-18 DIAGNOSIS — J309 Allergic rhinitis, unspecified: Secondary | ICD-10-CM | POA: Diagnosis not present

## 2018-12-06 DIAGNOSIS — J0110 Acute frontal sinusitis, unspecified: Secondary | ICD-10-CM | POA: Diagnosis not present

## 2018-12-06 DIAGNOSIS — R05 Cough: Secondary | ICD-10-CM | POA: Diagnosis not present

## 2018-12-12 ENCOUNTER — Ambulatory Visit (INDEPENDENT_AMBULATORY_CARE_PROVIDER_SITE_OTHER): Payer: Medicare Other

## 2018-12-12 ENCOUNTER — Ambulatory Visit (INDEPENDENT_AMBULATORY_CARE_PROVIDER_SITE_OTHER): Payer: Medicare Other | Admitting: Family Medicine

## 2018-12-12 ENCOUNTER — Other Ambulatory Visit: Payer: Self-pay

## 2018-12-12 ENCOUNTER — Encounter: Payer: Self-pay | Admitting: Family Medicine

## 2018-12-12 VITALS — BP 113/71 | HR 90 | Temp 98.4°F | Ht 61.0 in | Wt 120.0 lb

## 2018-12-12 DIAGNOSIS — J019 Acute sinusitis, unspecified: Secondary | ICD-10-CM | POA: Diagnosis not present

## 2018-12-12 DIAGNOSIS — M25511 Pain in right shoulder: Secondary | ICD-10-CM

## 2018-12-12 DIAGNOSIS — S4991XA Unspecified injury of right shoulder and upper arm, initial encounter: Secondary | ICD-10-CM | POA: Diagnosis not present

## 2018-12-12 NOTE — Patient Instructions (Addendum)
  Get pseudophenylephrine (oral decongestant) take 1 tab every 4 hours while awake Complete antibiotic Start using humidifier when you sleep   If you have lab work done today you will be contacted with your lab results within the next 2 weeks.  If you have not heard from [place] then please contact us. The fastest way to get your results is to register for My Chart.   IF you received an x-ray today, you will receive an invoice from [facility] Radiology. Please contact [facility] Radiology at [phone] with questions or concerns regarding your invoice.   IF you received labwork today, you will receive an invoice from [place]. Please contact [facility] at [phone] with questions or concerns regarding your invoice.   Our billing staff will not be able to assist you with questions regarding bills from these companies.  You will be contacted with the lab results as soon as they are available. The fastest way to get your results is to activate your My Chart account. Instructions are located on the last page of this paperwork. If you have not heard from [place] regarding the results in 2 weeks, please contact this office.

## 2018-12-12 NOTE — Progress Notes (Signed)
1/6/20201:52 PM  Anne Mills 10-21-1946, 73 y.o. female [id number]  Chief Complaint  Patient presents with  . Pain    follow up on right shoulder pain. Still having pain in those areas. Has no more meds for the pain. Did buy a bottle otc    HPI:   Patient is a 73 y.o. female who presents today for right shoulder after a fall  Elbow much better Still having pain in her shoulder, when she drives, bringing shoulder at 90 degrees her deltoid really hurts. Ibuprofen and otc topicals help Feels RUE weaker, no numbness and tingling Right handed  Has been having issues with sinus, cough, pressure, drainage all day No fever or chills Gets allergy immunoptherapy Seen at urgent care, and gave her augmentin and cough medications Does flonase and loratidine daily In the past has had issues with singulair - foggy Has done well with zpack in the past  Fall Risk  12/12/2018 11/02/2018 02/16/2018 02/16/2018 09/15/2017  Falls in the past year? 0 0 No No No     Depression screen [facility] 2/9 12/12/2018 11/02/2018 02/16/2018  Decreased Interest 0 0 0  Down, Depressed, Hopeless 0 0 0  PHQ - 2 Score 0 0 0    Allergies  Allergen Reactions  . Gluten Meal Cough  . Biaxin [Clarithromycin] Nausea And Vomiting    Prior to Admission medications   Medication Sig Start Date End Date Taking? Authorizing Provider  B Complex Vitamins (VITAMIN B-COMPLEX PO) Take by mouth. Liquid   Yes [provider]  Calcium Carbonate-Vitamin D (CALCIUM 600 + D PO) Take 1 tablet by mouth daily.   Yes [provider]  CALCIUM PO Take 1,200 mg by mouth.   Yes [provider]  Camphor-Menthol-Methyl Sal 1.2-5.7-6.3 % PTCH Apply topically.   Yes [provider]  Cholecalciferol 2000 UNITS TABS Take 1 tablet by mouth daily.   Yes [provider]  EPINEPHrine (EPIPEN 2-PAK) 0.3 mg/0.3 mL IJ SOAJ injection Use as directed for severe allergic reaction 07/07/18  Yes Kozlow, [name], MD    EVENING PRIMROSE OIL PO Take 1,300 mg by mouth.   Yes [provider]  gabapentin (NEURONTIN) 300 MG capsule Take 300 mg by mouth 3 (three) times daily.   Yes [provider]  GELATIN PO Take 1,300 mg by mouth.   Yes [provider]  ibuprofen (ADVIL,MOTRIN) 600 MG tablet Take 1 tablet (600 mg total) by mouth every 8 (eight) hours as needed. 11/02/18  Yes [name], MD  loratadine (CLARITIN) 10 MG tablet Take 10 mg by mouth daily.   Yes [provider]  Magnesium 500 MG CAPS Take by mouth.   Yes [provider]  Misc Natural Products (OSTEO BI-FLEX TRIPLE STRENGTH) TABS Take 2 tablets by mouth daily.   Yes [provider]  Multiple Vitamins-Minerals (STRESS TAB NF) TABS Take 1 tablet by mouth daily.   Yes [provider]  NAPROXEN PO Take by mouth as needed.   Yes [provider]  Omega-3 Fatty Acids (FISH OIL PO) Take by mouth.   Yes [provider]  OVER THE COUNTER MEDICATION Transitions w/Hesperidin vegetarian taking   Yes [provider]  OVER THE COUNTER MEDICATION Wal-Finate Dw/Pseudoephedrine taking   Yes [provider]  Probiotic Product (PROBIOTIC PO) Take by mouth. 10 Billion Active Cultures   Yes [provider]  PSEUDOEPHED-CHLORPHEN-DM-GG PO Take by mouth.   Yes [provider]  Trolamine Salicylate (ASPERCREME  EX) Apply 1 application topically 2 (two) times daily as needed (for back pain).   Yes [provider]    Past Medical History:  Diagnosis Date  . Allergy   . Asthma    no inhaler  . Back pain, chronic   . Chest pain    under left breast- 2-3 x per week: h/o breast surgery  . GERD (gastroesophageal reflux disease)   . Headache(784.0)   . Heart murmur    "not serious" per pt-no echo  . Scoliosis   . Seasonal allergies   . Shortness of breath    per pt due to allergies    Past Surgical History:  Procedure Laterality Date  .  ABDOMINOPLASTY    . BREAST SURGERY     lt cyst  . COLONOSCOPY    . DILATION AND CURETTAGE OF UTERUS N/A 03/03/2013   Procedure: DILATATION AND CURETTAGE;  Surgeon: Lahoma Crocker, MD;  Location: [facility];  Service: Gynecology;  Laterality: N/A;  . EXAMINATION UNDER ANESTHESIA N/A 03/03/2013   Procedure: EXAM UNDER ANESTHESIA with ultrasound;  Surgeon: Lahoma Crocker, MD;  Location: [facility];  Service: Gynecology;  Laterality: N/A;  . TRIGGER FINGER RELEASE Left 06/21/2014   Procedure: LEFT THUMB TRIGGER RELEASE;  Surgeon: Tennis Must, MD;  Location: [place];  Service: Orthopedics;  Laterality: Left;    Social History   Tobacco Use  . Smoking status: Never Smoker  . Smokeless tobacco: Never Used  Substance Use Topics  . Alcohol use: No    Family History  Problem Relation Age of Onset  . Dementia Mother   . Hypertension Mother   . Cancer Mother 50       Breast cancer  . Depression Mother   . Cancer Sister   . Diabetes Sister   . Heart disease Sister 67       AMI as cause of death  . Hyperlipidemia Sister   . Hypertension Sister   . Mental retardation Sister   . Cancer Father        prostate cancer  . Heart disease Sister 77       AMI  . Diabetes Sister     ROS Per hpi  OBJECTIVE:  Blood pressure 113/71, pulse 90, temperature 98.4 F (36.9 C), temperature source Oral, height 5\' 1"  (1.549 m), weight 120 lb (54.4 kg), SpO2 96 %. Body mass index is 22.67 kg/m.   Physical Exam Vitals signs and nursing note reviewed.  Constitutional:      Appearance: She is well-developed.  HENT:     Head: Normocephalic and atraumatic.     Right Ear: Hearing, tympanic membrane, ear canal and external ear normal.     Left Ear: Hearing, tympanic membrane, ear canal and external ear normal.     Nose:     Right Sinus: No maxillary sinus tenderness or frontal sinus tenderness.     Left Sinus: No maxillary sinus tenderness or frontal sinus tenderness.      Mouth/Throat:     Pharynx: No oropharyngeal exudate or posterior oropharyngeal erythema.  Eyes:     General: No scleral icterus.    Conjunctiva/sclera: Conjunctivae normal.     Pupils: Pupils are equal, round, and reactive to light.  Neck:     Musculoskeletal: Neck supple.  Pulmonary:     Effort: Pulmonary effort is normal.  Musculoskeletal:     Right shoulder: She exhibits tenderness (over deltoid) and swelling (over deltoid). She exhibits normal range of motion  and normal strength.     Comments: + neers and hawkins  Skin:    General: Skin is warm and dry.  Neurological:     Mental Status: She is alert and oriented to person, place, and time.     Dg Shoulder Right  Result Date: 12/12/2018 CLINICAL DATA:  Fall.  Right shoulder pain. EXAM: RIGHT SHOULDER - 2+ VIEW COMPARISON:  None. FINDINGS: There is no evidence of fracture or dislocation. There is no evidence of arthropathy or other focal bone abnormality. Soft tissues are unremarkable. IMPRESSION: No acute fracture or dislocation of the right shoulder. Electronically Signed   By: Ulyses Jarred M.D.   On: 12/12/2018 14:07     ASSESSMENT and PLAN  1. Acute pain of right shoulder Exam suggestive of bursitis with impingement. Referring to sports medicine for further eval and treatment  - DG Shoulder Right; Future - Ambulatory referral to Sports Medicine  2. Acute non-recurrent sinusitis, unspecified location Improving. Complete abx. Cont with supportive measures. Add oral decongestant  Return if symptoms worsen or fail to improve.    [name], MD Primary Care at [place], [place] [address] Ph.  [phone] Fax [phone]

## 2018-12-15 ENCOUNTER — Ambulatory Visit: Payer: Medicare Other | Admitting: Sports Medicine

## 2018-12-15 ENCOUNTER — Ambulatory Visit: Payer: Self-pay

## 2018-12-15 ENCOUNTER — Encounter: Payer: Self-pay | Admitting: Sports Medicine

## 2018-12-15 VITALS — BP 94/58 | Ht 61.0 in | Wt 120.0 lb

## 2018-12-15 DIAGNOSIS — M25511 Pain in right shoulder: Secondary | ICD-10-CM

## 2018-12-15 MED ORDER — METHYLPREDNISOLONE ACETATE 40 MG/ML IJ SUSP
40.0000 mg | Freq: Once | INTRAMUSCULAR | Status: AC
  Administered 2018-12-15: 40 mg via INTRA_ARTICULAR

## 2018-12-15 NOTE — Progress Notes (Signed)
  Anne Mills - 73 y.o. female MRN [id number]  Date of birth: Aug 20, 1946    SUBJECTIVE:      Chief Complaint:/ HPI:  73 year old female with 6/10 aching occasionally sharp right shoulder pain for approximately 6 months.  In May 2019, she reports falling and landing on her right elbow.  She did not experience immediate shoulder pain but this did start a couple months later.  Her pain is located mostly over the lateral shoulder although does radiate from the trapezius to the mid humerus at times.  Pain is worse with overhead movement or if trying to lift objects with her arm extended.  She denies any decreased range of motion.  She denies any numbness or tingling in the extremity.  She does feel somewhat weak.  No associated swelling or erythema.  She has taken Aleve on occasion with minimal benefit   ROS:     See HPI  PERTINENT  PMH / PSH FH / / SH:  Past Medical, Surgical, Social, and Family History Reviewed & Updated in the EMR.     OBJECTIVE: BP (!) 94/58   Ht 5\' 1"  (1.549 m)   Wt 120 lb (54.4 kg)   BMI 22.67 kg/m   Physical Exam:  Vital signs are reviewed.  GEN: Alert and oriented, NAD Pulm: Breathing unlabored PSY: normal mood, congruent affect  MSK: Right shoulder: No obvious deformity or asymmetry. No bruising. No swelling Diffuse tenderness over the lateral shoulder and distal insertion of the trapezius.  No tenderness over the [facility] joint Full ROM in flexion, abduction, internal/external rotation with mild discomfort NV intact distally Special Tests:  - Impingement: Mildly positive Hawkins and Neers.  - Supraspinatus: Negative empty can.  5/5 strength - Infraspinatus/Teres: 5/5 strength with ER - Subscapularis: negative belly press, negative bear hug. 5/5 strength with IR - Biceps tendon: Negative Speeds.  - Labrum: Negative Obriens.   Left shoulder: Mild bony hypertrophy noted over the [facility] joint No tenderness Full range of motion without pain N/V intact  distally 5/5 strength with rotator cuff testing  MSK [place]: Ultrasound of Shoulder  BT short: normal appearance, no tears, small area of tenosynovitis just distal to the bicipital groove.  This does not correspond with an area of patient's tenderness BT long: normal appearance, intact without tears Supraspinatus tendon: normal appearance, no tears of degenerative changes, trace fluid within the bursa Subscapularis tendon: normal appearance without tears or degenerative changes Infraspinatus tendon:  normal appearance without tears or degenerative changes Teres Minor tendon:  normal appearance without tears or degenerative changes GH joint: normal appearing posterior labrum AC joint:  Moderate degenerative changes, positive geyser sign  Summary and Additional findings-trace fluid within the subacromial bursa, moderate AC joint arthritis     ASSESSMENT & PLAN:  1.  Right shoulder pain secondary to impingement and possible mild bursitis-.  X-rays were independently reviewed today which show no acute bony abnormality.  Ultrasound today shows intact rotator cuff with trace amount of fluid in the bursa. -Subacromial steroid injection performed today which patient tolerated well -Instructed on home exercises for rotator cuff strengthening -NSAIDs as needed -Ice -Follow-up in 6 weeks  I was the preceptor for this visit and available for immediate consultation Shellia Cleverly, DO

## 2018-12-15 NOTE — Patient Instructions (Signed)
Your shoulder pain is due to impingement.  Your rotator cuff showed no tears on ultrasound Today we performed a acromial steroid injection.  This should begin to give improvement over the next week Do the home exercises you were taught today daily You may use Aleve as needed for pain Ice 15 minutes 3-4 times per day may be helpful Follow-up in 6 weeks

## 2019-01-04 ENCOUNTER — Telehealth: Payer: Self-pay

## 2019-01-04 NOTE — Telephone Encounter (Signed)
Patient called and stated that it has been a while since she has received an injection. She stated she was sick and then went on vacation and is wanting to come tomorrow afternoon to get her allergy shot. Patients last injection was 11/18/2018 and she started a new red vial and received 0.48ml. Please advise on where to start her allergy injection.

## 2019-01-04 NOTE — Telephone Encounter (Signed)
Noted and have placed it in patient flowsheet.

## 2019-01-04 NOTE — Telephone Encounter (Signed)
Please use same red vial and administer 0.05 ml.

## 2019-01-05 ENCOUNTER — Ambulatory Visit (INDEPENDENT_AMBULATORY_CARE_PROVIDER_SITE_OTHER): Payer: Medicare Other | Admitting: *Deleted

## 2019-01-05 DIAGNOSIS — J309 Allergic rhinitis, unspecified: Secondary | ICD-10-CM

## 2019-01-06 ENCOUNTER — Ambulatory Visit: Payer: Medicare Other | Admitting: Allergy

## 2019-01-06 ENCOUNTER — Encounter: Payer: Self-pay | Admitting: Allergy

## 2019-01-06 VITALS — BP 108/78 | HR 84 | Temp 97.9°F | Resp 16 | Ht 61.0 in | Wt 120.0 lb

## 2019-01-06 DIAGNOSIS — J0110 Acute frontal sinusitis, unspecified: Secondary | ICD-10-CM

## 2019-01-06 DIAGNOSIS — J3089 Other allergic rhinitis: Secondary | ICD-10-CM | POA: Diagnosis not present

## 2019-01-06 NOTE — Progress Notes (Signed)
Follow-up Note  RE: Anne Mills MRN: [id number] DOB: Oct 17, 1946 Date of Office Visit: 01/06/2019   History of present illness: Anne Mills is a 73 y.o. female presenting today for sick visit.  She was last seen in the office on August 26, 2018 by myself after she had a side effect related to her allergen injection.  This has resolved. Today she returns as she states she started to feel sick yesterday but is now feeling better.  Yesterday she woke up and had yellow phlegm, sinus pressure and HA. She called for appt as she states she didn't want to get sick like she did back in Dec.  She took psuedoephedrine twice yesterday which she states did help her symptoms.   Today she states she is feeling better.  However she still feels phlegm in her throat.  No fever.  She is performing saline rinses nightly.  No known sick contacts however she just got back Monday from a cruise to [place], [place], [place], [place].    She continues on immunotherapy for her allergic rhinitis.  Review of systems: Review of Systems  Constitutional: Negative for chills, fever and malaise/fatigue.  HENT: Positive for congestion and sinus pain. Negative for ear discharge, nosebleeds and sore throat.   Eyes: Negative for pain, discharge and redness.  Respiratory: Negative for cough, shortness of breath and wheezing.   Cardiovascular: Negative for chest pain.  Gastrointestinal: Negative for abdominal pain, constipation, diarrhea, heartburn, nausea and vomiting.  Musculoskeletal: Negative for joint pain.  Skin: Negative for itching and rash.  Neurological: Negative for headaches.    All other systems negative unless noted above in HPI  Past medical/social/surgical/family history have been reviewed and are unchanged unless specifically indicated below.  No changes  Medication List: Allergies as of 01/06/2019      Reactions   Gluten Meal Cough   Biaxin [clarithromycin] Nausea And Vomiting      Medication List       Accurate as of January 06, 2019  4:06 PM. Always use your most recent med list.        ASPERCREME EX Apply 1 application topically 2 (two) times daily as needed (for back pain).   CALCIUM 600 + D PO Take 1 tablet by mouth daily.   CALCIUM PO Take 1,200 mg by mouth.   Camphor-Menthol-Methyl Sal 1.2-5.7-6.3 % Ptch Apply topically.   Cholecalciferol 50 MCG (2000 UT) Tabs Take 1 tablet by mouth daily.   EPINEPHrine 0.3 mg/0.3 mL Soaj injection Commonly known as:  EPIPEN 2-PAK Use as directed for severe allergic reaction   EVENING PRIMROSE OIL PO Take 1,300 mg by mouth.   FISH OIL PO Take by mouth.   gabapentin 300 MG capsule Commonly known as:  NEURONTIN Take 300 mg by mouth 3 (three) times daily.   GELATIN PO Take 1,300 mg by mouth.   ibuprofen 600 MG tablet Commonly known as:  ADVIL,MOTRIN Take 1 tablet (600 mg total) by mouth every 8 (eight) hours as needed.   loratadine 10 MG tablet Commonly known as:  CLARITIN Take 10 mg by mouth daily.   Magnesium 500 MG Caps Take by mouth.   NAPROXEN PO Take by mouth as needed.   OSTEO BI-FLEX TRIPLE STRENGTH Tabs Take 2 tablets by mouth daily.   OVER THE COUNTER MEDICATION Transitions w/Hesperidin vegetarian taking   OVER THE COUNTER MEDICATION Wal-Finate Dw/Pseudoephedrine taking   PROBIOTIC PO Take by mouth. 10 Billion Active Cultures   PSEUDOEPHED-CHLORPHEN-DM-GG  PO Take by mouth.   STRESS TAB NF Tabs Take 1 tablet by mouth daily.   VITAMIN B-COMPLEX PO Take by mouth. Liquid       Known medication allergies: Allergies  Allergen Reactions  . Gluten Meal Cough  . Biaxin [Clarithromycin] Nausea And Vomiting     Physical examination: Blood pressure 108/78, pulse 84, temperature 97.9 F (36.6 C), temperature source Oral, resp. rate 16, height 5\' 1"  (1.549 m), weight 120 lb (54.4 kg), SpO2 98 %.  General: Alert, interactive, in no acute distress. HEENT: PERRLA, TMs  pearly gray, turbinates moderately edematous with clear discharge, post-pharynx non erythematous. Neck: Supple without lymphadenopathy. Lungs: Clear to auscultation without wheezing, rhonchi or rales. {no increased work of breathing. CV: Normal S1, S2 without murmurs. Abdomen: Nondistended, nontender. Skin: Warm and dry, without lesions or rashes. Extremities:  No clubbing, cyanosis or edema. Neuro:   Grossly intact.  Diagnositics/Labs: None today  Assessment and plan:   Acute Sinusitis - symptoms like viral in nature given short duration of symptoms. She does recent exposure to enclose [place] on cruise and likely has had sick contacts.  At this time does not warrant antibiotics.  Will provide with 3 day prednisone course if symptoms worsen advise to take.   Allergic rhinitis - continue current regimen with immunotherapy, loratadine and nasal steroid  1.  Continue immunotherapy and Epi-Pen  2.  Continue Claritin / loratadine 10mg  daily  3.  Use OTC Nasacort / Rhinocort 1 spray each nostril 3-7 times per week  4. Continue nightly saline rinses  5. If current symptoms are not improving recommend completing 3 days prednisone course if 20mg  daily  6. Can take the pseudoephedrine medication you have for severe congestion/sinus pressure but would not take more than 3 days at a time   7. Continue therapy for reflux including apple cider vinegar  8  Continue to minimize consumption of chocolate and all forms of caffeine  9.  Obtain fall flu vaccine if not done so this season  10.  Keep follow-up with Dr. Neldon Mc in February  I appreciate the opportunity to take part in Dalylah's care. Please do not hesitate to contact me with questions.  Sincerely,   Prudy Feeler, MD Allergy/Immunology [facility]

## 2019-01-06 NOTE — Patient Instructions (Addendum)
  1.  Continue immunotherapy and Epi-Pen  2.  Continue Claritin / loratadine 10mg  daily  3.  Use OTC Nasacort / Rhinocort 1 spray each nostril 3-7 times per week  4. Continue nightly saline rinses  5. If current symptoms are not improving recommend completing 3 days prednisone course if 20mg  daily  6. Can take the pseudoephedrine medication you have for severe congestion/sinus pressure but would not take more than 3 days at a time   7. Continue therapy for reflux including apple cider vinegar  8  Continue to minimize consumption of chocolate and all forms of caffeine  9.  Obtain fall flu vaccine if not done so this season  10.  Keep follow-up with Dr. Neldon Mc in February

## 2019-01-13 ENCOUNTER — Ambulatory Visit (INDEPENDENT_AMBULATORY_CARE_PROVIDER_SITE_OTHER): Payer: Medicare Other

## 2019-01-13 DIAGNOSIS — J309 Allergic rhinitis, unspecified: Secondary | ICD-10-CM

## 2019-01-19 DIAGNOSIS — M419 Scoliosis, unspecified: Secondary | ICD-10-CM | POA: Diagnosis not present

## 2019-01-19 DIAGNOSIS — G894 Chronic pain syndrome: Secondary | ICD-10-CM | POA: Diagnosis not present

## 2019-01-19 DIAGNOSIS — M5126 Other intervertebral disc displacement, lumbar region: Secondary | ICD-10-CM | POA: Diagnosis not present

## 2019-01-20 ENCOUNTER — Ambulatory Visit (INDEPENDENT_AMBULATORY_CARE_PROVIDER_SITE_OTHER): Payer: Medicare Other | Admitting: *Deleted

## 2019-01-20 DIAGNOSIS — J309 Allergic rhinitis, unspecified: Secondary | ICD-10-CM | POA: Diagnosis not present

## 2019-01-24 ENCOUNTER — Encounter: Payer: Self-pay | Admitting: Allergy and Immunology

## 2019-01-24 ENCOUNTER — Ambulatory Visit: Payer: Medicare Other | Admitting: Allergy and Immunology

## 2019-01-24 VITALS — BP 100/60 | HR 88 | Resp 16

## 2019-01-24 DIAGNOSIS — Z1231 Encounter for screening mammogram for malignant neoplasm of breast: Secondary | ICD-10-CM | POA: Diagnosis not present

## 2019-01-24 DIAGNOSIS — K219 Gastro-esophageal reflux disease without esophagitis: Secondary | ICD-10-CM

## 2019-01-24 DIAGNOSIS — J3089 Other allergic rhinitis: Secondary | ICD-10-CM

## 2019-01-24 DIAGNOSIS — Z803 Family history of malignant neoplasm of breast: Secondary | ICD-10-CM | POA: Diagnosis not present

## 2019-01-24 LAB — HM MAMMOGRAPHY

## 2019-01-24 NOTE — Progress Notes (Signed)
Follow-up Note  Referring Provider: Wardell Honour, MD Primary Provider: [name], MD Date of Office Visit: 01/24/2019  Subjective:   Anne Mills (DOB: 11/05/1946) is a 73 y.o. female who returns to [facility] on 01/24/2019 in re-evaluation of the following:  HPI: Anne Mills returns to this clinic in reevaluation of her allergic rhinoconjunctivitis treated with immunotherapy.  I have not seen her in this clinic since 26 August 2018.  She did visit with Dr. Nelva Bush on 06 January 2019 for a viral induced respiratory tract inflammatory flare for which she was treated with systemic steroids.  She has really done quite well while utilizing Flonase a few times a week and continuing on Claritin and consistent use of immunotherapy.  Currently her immunotherapy is every week.  She has not had any adverse effect from utilizing this form of treatment.  For the most part she has had an excellent year other than occasionally developing a viral respiratory tract infection.  She also had an issue involved with reflux in the past but she has had under very good control now by just using apple cider vinegar and does not use any proton pump inhibitor.  She is eliminated all chocolate and caffeine consumption.  She did obtain a flu vaccine this year.  Allergies as of 01/24/2019      Reactions   Gluten Meal Cough   Biaxin [clarithromycin] Nausea And Vomiting      Medication List      ASPERCREME EX Apply 1 application topically 2 (two) times daily as needed (for back pain).   CALCIUM 600 + D PO Take 1 tablet by mouth daily.   CALCIUM PO Take 1,200 mg by mouth.   Camphor-Menthol-Methyl Sal 1.2-5.7-6.3 % Ptch Apply topically.   Cholecalciferol 50 MCG (2000 UT) Tabs Take 1 tablet by mouth daily.   EPINEPHrine 0.3 mg/0.3 mL Soaj injection Commonly known as:  EPIPEN 2-PAK Use as directed for severe allergic reaction   EVENING PRIMROSE OIL PO Take 1,300 mg by  mouth.   FISH OIL PO Take by mouth.   gabapentin 300 MG capsule Commonly known as:  NEURONTIN Take 300 mg by mouth 3 (three) times daily.   GELATIN PO Take 1,300 mg by mouth.   ibuprofen 600 MG tablet Commonly known as:  ADVIL,MOTRIN Take 1 tablet (600 mg total) by mouth every 8 (eight) hours as needed.   loratadine 10 MG tablet Commonly known as:  CLARITIN Take 10 mg by mouth daily.   Magnesium 500 MG Caps Take by mouth.   NAPROXEN PO Take by mouth as needed.   OSTEO BI-FLEX TRIPLE STRENGTH Tabs Take 2 tablets by mouth daily.   OVER THE COUNTER MEDICATION Transitions w/Hesperidin vegetarian taking   OVER THE COUNTER MEDICATION Wal-Finate Dw/Pseudoephedrine taking   PROBIOTIC PO Take by mouth. 10 Billion Active Cultures   PSEUDOEPHED-CHLORPHEN-DM-GG PO Take by mouth.   STRESS TAB NF Tabs Take 1 tablet by mouth daily.   VITAMIN B-COMPLEX PO Take by mouth. Liquid       Past Medical History:  Diagnosis Date  . Allergy   . Asthma    no inhaler  . Back pain, chronic   . Chest pain    under left breast- 2-3 x per week: h/o breast surgery  . GERD (gastroesophageal reflux disease)   . Headache(784.0)   . Heart murmur    "not serious" per pt-no echo  . Scoliosis   . Seasonal allergies   .  Shortness of breath    per pt due to allergies    Past Surgical History:  Procedure Laterality Date  . ABDOMINOPLASTY    . BREAST SURGERY     lt cyst  . COLONOSCOPY    . DILATION AND CURETTAGE OF UTERUS N/A 03/03/2013   Procedure: DILATATION AND CURETTAGE;  Surgeon: Lahoma Crocker, MD;  Location: [facility];  Service: Gynecology;  Laterality: N/A;  . EXAMINATION UNDER ANESTHESIA N/A 03/03/2013   Procedure: EXAM UNDER ANESTHESIA with ultrasound;  Surgeon: Lahoma Crocker, MD;  Location: [facility];  Service: Gynecology;  Laterality: N/A;  . TRIGGER FINGER RELEASE Left 06/21/2014   Procedure: LEFT THUMB TRIGGER RELEASE;  Surgeon: Tennis Must, MD;  Location: [facility];  Service: Orthopedics;  Laterality: Left;    Review of systems negative except as noted in HPI / PMHx or noted below:  Review of Systems  Constitutional: Negative.   HENT: Negative.   Eyes: Negative.   Respiratory: Negative.   Cardiovascular: Negative.   Gastrointestinal: Negative.   Genitourinary: Negative.   Musculoskeletal: Negative.   Skin: Negative.   Neurological: Negative.   Endo/Heme/Allergies: Negative.   Psychiatric/Behavioral: Negative.      Objective:   Vitals:   01/24/19 1108  BP: 100/60  Pulse: 88  Resp: 16          Physical Exam Constitutional:      Appearance: She is not diaphoretic.  HENT:     Head: Normocephalic.     Right Ear: Tympanic membrane, ear canal and external ear normal.     Left Ear: Tympanic membrane, ear canal and external ear normal.     Nose: Nose normal. No mucosal edema or rhinorrhea.     Mouth/Throat:     Pharynx: Uvula midline. No oropharyngeal exudate.  Eyes:     Conjunctiva/sclera: Conjunctivae normal.  Neck:     Thyroid: No thyromegaly.     Trachea: Trachea normal. No tracheal tenderness or tracheal deviation.  Cardiovascular:     Rate and Rhythm: Normal rate and regular rhythm.     Heart sounds: Normal heart sounds, S1 normal and S2 normal. No murmur.  Pulmonary:     Effort: No respiratory distress.     Breath sounds: Normal breath sounds. No stridor. No wheezing or rales.  Lymphadenopathy:     Head:     Right side of head: No tonsillar adenopathy.     Left side of head: No tonsillar adenopathy.     Cervical: No cervical adenopathy.  Skin:    Findings: No erythema or rash.     Nails: There is no clubbing.   Neurological:     Mental Status: She is alert.     Diagnostics: none  Assessment and Plan:   1. Perennial allergic rhinitis   2. Gastroesophageal reflux disease, esophagitis presence not specified     1.  Continue immunotherapy and Epi-Pen  2.  Continue Claritin / loratadine  10mg  daily  3.  Use OTC Flonase1 spray each nostril 3-7 times per week  4. Continue therapy for reflux including apple cider vinegar  5.  Continue to minimize consumption of chocolate and all forms of caffeine  6.  Return to clinic in 6 months or earlier if problem  Daven is really doing very well on her current plan and I made the recommendation that she continue on this plan which includes immunotherapy and I will see her back in this clinic in 6 months or earlier if there  is a problem.  Allena Katz, MD Allergy / Immunology [place]

## 2019-01-24 NOTE — Patient Instructions (Signed)
  1.  Continue immunotherapy and Epi-Pen  2.  Continue Claritin / loratadine 10mg  daily  3.  Use OTC Flonase1 spray each nostril 3-7 times per week  4. Continue therapy for reflux including apple cider vinegar  5.  Continue to minimize consumption of chocolate and all forms of caffeine  6.  Return to clinic in 6 months or earlier if problem

## 2019-01-25 ENCOUNTER — Encounter: Payer: Self-pay | Admitting: Allergy and Immunology

## 2019-01-26 ENCOUNTER — Ambulatory Visit: Payer: Medicare Other | Admitting: Sports Medicine

## 2019-02-02 ENCOUNTER — Encounter: Payer: Self-pay | Admitting: Family Medicine

## 2019-02-02 ENCOUNTER — Ambulatory Visit: Payer: Medicare Other | Admitting: Sports Medicine

## 2019-02-02 ENCOUNTER — Ambulatory Visit (INDEPENDENT_AMBULATORY_CARE_PROVIDER_SITE_OTHER): Payer: Medicare Other

## 2019-02-02 ENCOUNTER — Encounter: Payer: Self-pay | Admitting: Internal Medicine

## 2019-02-02 ENCOUNTER — Encounter: Payer: Self-pay | Admitting: Sports Medicine

## 2019-02-02 VITALS — BP 102/68 | Ht 61.0 in | Wt 120.0 lb

## 2019-02-02 DIAGNOSIS — M25511 Pain in right shoulder: Secondary | ICD-10-CM

## 2019-02-02 DIAGNOSIS — J3089 Other allergic rhinitis: Secondary | ICD-10-CM | POA: Diagnosis not present

## 2019-02-02 NOTE — Progress Notes (Signed)
  Anne Mills - 73 y.o. female MRN [id number]  Date of birth: [birth year]    SUBJECTIVE:      Chief Complaint: right shoulder are pain  HPI:  Anne Mills is a 73 yo F who presents today for follow-up evaluation of her right shoulder area pain. She was initially seen and treated for probable impingement syndrome with a corticosteroid injection which she denied marked improvement from. Today she endorses pain that shoots down her arm into her right hand and forearm and not specific pain of the anterior deltoid region. She is unable to pinpoint the sight of her pain with a single finger. She endorses pain with sleeping on the right side of her body in the arm and hand.  Her pain seems the most exacerbated by actions that require her to reach out with her arm away from her body such as with driving or picking up objects.  She does endorse neck pain.  She occasionally feels nonspecific numbness/tingling distally in the hand but is unable to localize this.  Patient admits to being inconsistent with home exercises.  ROS:     See HPI  PERTINENT  PMH / PSH FH / / SH:  Past Medical, Surgical, Social, and Family History Reviewed & Updated in the EMR.  Pertinent findings include:   OBJECTIVE: BP 102/68   Ht 5\' 1"  (1.549 m)   Wt 120 lb (54.4 kg)   BMI 22.67 kg/m   Physical Exam:  Vital signs are reviewed.  GEN: Alert and oriented, NAD Pulm: Breathing unlabored PSY: normal mood, congruent affect  C-spine: No obvious deformities No midline/bony tenderness to palpation She does have some tenderness over the right trapezius Full range of motion of the neck without pain 5/5 strength grossly in the bilateral upper extremities.  Normal grip strength N/V intact distally  Right shoulder No obvious deformity or asymmetry. No bruising. No swelling Mild tenderness over the bicipital groove.  No tenderness over the [facility] joint Full ROM in flexion, abduction, internal/external rotation with very mild  pain at the extremes of motion. NV intact distally Special Tests:  - Impingement: Positive Hawkins.  Negative Neers.  - Supraspinatus: Negative empty can.  5/5 strength - Infraspinatus/Teres: 5/5 strength with ER - Subscapularis: negative belly press, 5/5 strength with IR - Biceps tendon: Negative speeds - AC Joint: Negative cross arm    ASSESSMENT & PLAN:  1.  Right shoulder pain- patient does have signs and symptoms consistent with impingement.  There may have been some slight improvement in this but she does continue have pain.  She also endorses some symptoms into the distal arm and hand which are less consistent with impingement.  Concern for possible cervical radiculopathy as an underlying cause for this.  As result, we will obtain MRI to further investigate. -Continue NSAIDs - We will refer her to physical therapy -MRI C-spine -Follow-up on 02/23/2019 after MRI complete  I was the preceptor for this visit and available for immediate consultation Shellia Cleverly, DO

## 2019-02-02 NOTE — Patient Instructions (Signed)
In addition to shoulder impingement, your symptoms are concerning for possible nerve impingement of your cervical spine. We will obtain an MRI of your neck to further evaluate this. We will send you to physical therapy for them to focus on rehab for the shoulder impingement. Aleve 2 tablets twice daily or ibuprofen 600 mg 3 times daily as needed for pain We will have you follow-up after the MRI on [id number]

## 2019-02-06 NOTE — Progress Notes (Signed)
1: NEG

## 2019-02-09 ENCOUNTER — Ambulatory Visit (INDEPENDENT_AMBULATORY_CARE_PROVIDER_SITE_OTHER): Payer: Medicare Other | Admitting: *Deleted

## 2019-02-09 DIAGNOSIS — J309 Allergic rhinitis, unspecified: Secondary | ICD-10-CM

## 2019-02-13 ENCOUNTER — Ambulatory Visit: Payer: Medicare Other | Attending: Sports Medicine | Admitting: Physical Therapy

## 2019-02-13 ENCOUNTER — Other Ambulatory Visit: Payer: Self-pay

## 2019-02-13 ENCOUNTER — Encounter: Payer: Self-pay | Admitting: Physical Therapy

## 2019-02-13 ENCOUNTER — Ambulatory Visit (INDEPENDENT_AMBULATORY_CARE_PROVIDER_SITE_OTHER): Payer: Medicare Other

## 2019-02-13 DIAGNOSIS — J309 Allergic rhinitis, unspecified: Secondary | ICD-10-CM | POA: Diagnosis not present

## 2019-02-13 DIAGNOSIS — M25511 Pain in right shoulder: Secondary | ICD-10-CM | POA: Diagnosis not present

## 2019-02-13 NOTE — Therapy (Signed)
[place], [place], [address] Phone: [phone]   Fax:  [phone]  Physical Therapy Evaluation  Patient Details  Name: Anne Mills MRN: [id number] Date of Birth: 1946/04/11 Referring Provider (PT): Lilia Argue, MD   Encounter Date: 02/13/2019  PT End of Session - 02/13/19 1317    Visit Number  1    Number of Visits  10    Date for PT Re-Evaluation  03/27/19    Authorization Type  [employer] Medicare    PT Start Time  1143    PT Stop Time  1232    PT Time Calculation (min)  49 min    Activity Tolerance  Patient tolerated treatment well    Behavior During Therapy  [facility] for tasks assessed/performed       Past Medical History:  Diagnosis Date  . Allergy   . Asthma    no inhaler  . Back pain, chronic   . Chest pain    under left breast- 2-3 x per week: h/o breast surgery  . GERD (gastroesophageal reflux disease)   . Headache(784.0)   . Heart murmur    "not serious" per pt-no echo  . Scoliosis   . Seasonal allergies   . Shortness of breath    per pt due to allergies    Past Surgical History:  Procedure Laterality Date  . ABDOMINOPLASTY    . BREAST SURGERY     lt cyst  . COLONOSCOPY    . DILATION AND CURETTAGE OF UTERUS N/A 03/03/2013   Procedure: DILATATION AND CURETTAGE;  Surgeon: Lahoma Crocker, MD;  Location: [facility];  Service: Gynecology;  Laterality: N/A;  . EXAMINATION UNDER ANESTHESIA N/A 03/03/2013   Procedure: EXAM UNDER ANESTHESIA with ultrasound;  Surgeon: Lahoma Crocker, MD;  Location: [facility];  Service: Gynecology;  Laterality: N/A;  . TRIGGER FINGER RELEASE Left 06/21/2014   Procedure: LEFT THUMB TRIGGER RELEASE;  Surgeon: Tennis Must, MD;  Location: [facility];  Service: Orthopedics;  Laterality: Left;    There were no vitals filed for this visit.   Subjective Assessment - 02/13/19 1148    Subjective  Pt. had acute onset right shoulder pain secondary  to fall last May. Previous history of intermittent right shoulder pain issues for approximately 2 years prior to fall. X-rays (-) acute injury. Symptoms suspected as associated with impingement and pt. had injection to address. Pt. subsequently began having radiating pain and parasthesias down right arm into hand. Cervical MRI has been ordered and is scheduled for 02/20/19.    Pertinent History  scoliosis    Limitations  Lifting;House hold activities;Sitting   driving   Diagnostic tests  X-rays    Patient Stated Goals  Improve shoulder/arm pain    Currently in Pain?  Yes    Pain Score  5     Pain Location  Shoulder    Pain Descriptors / Indicators  Dull    Pain Type  Chronic pain    Pain Radiating Towards  right arm and hand    Pain Onset  More than a month ago    Pain Frequency  Constant    Aggravating Factors   use of right arm, reaching, lying on right side, driving, sitting    Pain Relieving Factors  positioning right arm/hand behind back    Effect of Pain on Daily Activities  limits ability and tolerance for reaching ADL, positional tolerance for sitting, difficulty driving, disturbed sleep  [facility] PT Assessment - 02/13/19 0001      Assessment   Medical Diagnosis  Right rotator cuff impingement    Referring Provider (PT)  Lilia Argue, MD    Onset Date/Surgical Date  04/06/18   estimated per report of onset with fall 5/19   Hand Dominance  Right    Next MD Visit  02/23/2019    Prior Therapy  none      Precautions   Precautions  None      Restrictions   Weight Bearing Restrictions  No      Balance Screen   Has the patient fallen in the past 6 months  No      [place] residence    Living Arrangements  Spouse/significant other      Prior Function   Level of Independence  Independent with basic ADLs      Cognition   Overall Cognitive Status  Within Functional Limits for tasks assessed      Observation/Other Assessments    Focus on Therapeutic Outcomes (FOTO)   51% limited      Sensation   Additional Comments  Decreased sensation to light touch at right C5 dermatome otherwise grossly intact bilat. C4-T1 dermatomes      ROM / Strength   AROM / PROM / Strength  AROM;PROM;Strength      AROM   AROM Assessment Site  Shoulder;Cervical    Right/Left Shoulder  Right;Left    Right Shoulder Flexion  155 Degrees    Right Shoulder ABduction  150 Degrees    Right Shoulder Internal Rotation  --   reach to T4   Right Shoulder External Rotation  90 Degrees    Left Shoulder Flexion  170 Degrees    Left Shoulder ABduction  180 Degrees    Left Shoulder Internal Rotation  --   reach to T5   Left Shoulder External Rotation  90 Degrees    Cervical Flexion  40    Cervical Extension  35    Cervical - Right Side Bend  30    Cervical - Left Side Bend  30    Cervical - Right Rotation  70    Cervical - Left Rotation  70      Strength   Overall Strength Comments  Left grip 32 lbs., right grip 30 lbs. with grip dynamonometer    Strength Assessment Site  Shoulder;Elbow;Wrist    Right/Left Shoulder  Right;Left    Right Shoulder Flexion  5/5    Right Shoulder ABduction  4+/5    Right Shoulder Internal Rotation  5/5    Right Shoulder External Rotation  5/5    Left Shoulder Flexion  5/5    Left Shoulder ABduction  5/5    Left Shoulder Internal Rotation  5/5    Left Shoulder External Rotation  4+/5    Right/Left Elbow  Right;Left    Right Elbow Flexion  5/5    Right Elbow Extension  5/5    Left Elbow Flexion  5/5    Left Elbow Extension  5/5    Right/Left Wrist  Right;Left    Right Wrist Flexion  5/5    Right Wrist Extension  5/5    Left Wrist Flexion  5/5    Left Wrist Extension  5/5      Special Tests   Other special tests  (-) Spurling's, (+) Hawkin's right shoulder, upper limb tension tests for median, radial, ulnar nerve all (-),  cervical distraction "felt good" but no right arm symptoms at time of performance  so unable to assess centralization                Objective measurements completed on examination: See above findings.      [facility] Adult PT Treatment/Exercise - 02/13/19 0001      Exercises   Exercises  Neck;Shoulder      Neck Exercises: Seated   Neck Retraction  20 reps      Shoulder Exercises: Standing   External Rotation  Strengthening;Both;20 reps    Theraband Level (Shoulder External Rotation)  Level 2 (Red)    Internal Rotation  Right;20 reps    Theraband Level (Shoulder Internal Rotation)  Level 2 (Red)    Other Standing Exercises  HEP instruction tennis ball use for self-STM right posterior scapular region             PT Education - 02/13/19 1317    Education Details  HEP, POC, potential symptom etiology    Person(s) Educated  Patient    Methods  Explanation;Demonstration;Tactile cues;Verbal cues;Handout    Comprehension  Verbalized understanding;Returned demonstration          PT Long Term Goals - 02/13/19 1331      PT LONG TERM GOAL #1   Title  Independent with HEP    Baseline  needs HEP    Time  6    Period  Weeks    Status  New    Target Date  03/27/19      PT LONG TERM GOAL #2   Title  Improve FOTO score to 35% or less impairment    Baseline  51%    Time  6    Period  Weeks    Status  New    Target Date  03/27/19      PT LONG TERM GOAL #3   Title  Right shoulder abduction strength 5/5 to improve ability for lifting for chores    Baseline  4+/5    Time  6    Period  Weeks    Status  New    Target Date  03/27/19      PT LONG TERM GOAL #4   Title  Decrease sleep disturbance symptoms 50% or greater from current status    Time  6    Period  Weeks    Status  New    Target Date  03/27/19      PT LONG TERM GOAL #5   Title  Perform reaching for dressing, bathing with right shoulder pain 2/10 or less    Baseline  5/10    Time  6    Period  Weeks    Status  New    Target Date  03/27/19             Plan - 02/13/19  1318    Clinical Impression Statement  Pt. presents with right shoulder and arm pain since s/p fall last May. Shoulder pain with reaching along with pain in abduction and (+) Hawkin's consistent with impingement syndrome. Right arm and hand symptoms could be potentially radicular and expect MRI scheduled next week/ordered by MD should shed light on differential diagnosis. Pt. would benefit from PT to help relieve pain and address current associated limitations.    Personal Factors and Comorbidities  Age;Time since onset of injury/illness/exacerbation    Examination-Activity Limitations  Hygiene/Grooming;Bathing;Sleep;Dressing;Carry;Lift;Sit    Examination-Participation Restrictions  Laundry;Cleaning;Driving    Stability/Clinical Decision Making  Evolving/Moderate complexity    Clinical Decision Making  Moderate    Rehab Potential  Good    PT Frequency  --   1-2x/week   PT Duration  6 weeks    PT Treatment/Interventions  ADLs/Self Care Home Management;Cryotherapy;Ultrasound;Moist Heat;Iontophoresis 4mg /ml Dexamethasone;Electrical Stimulation;Functional mobility training;Neuromuscular re-education;Manual techniques;Dry needling;Therapeutic activities;Therapeutic exercise;Patient/family education;Taping    PT Next Visit Plan  Review HEP as needed and progress rotator cuff/periscapular strengthening as tolerated, check response retractions, STM posterior scapular region and potential trial dry needling right infraspinatus    PT Home Exercise Plan  Theraband ER, IR, cervical retractions    Consulted and Agree with Plan of Care  Patient       Patient will benefit from skilled therapeutic intervention in order to improve the following deficits and impairments:  Pain, Decreased strength, Impaired UE functional use, Decreased activity tolerance  Visit Diagnosis: Acute pain of right shoulder     Problem List Patient Active Problem List   Diagnosis Date Noted  . Asthma 12/13/2017  .  Gastroesophageal reflux disease 12/13/2017  . Other allergic rhinitis 12/13/2017  . Seasonal allergic conjunctivitis 12/13/2017  . Scoliosis (and kyphoscoliosis), idiopathic 09/15/2017  . Cervical stenosis (uterine cervix) 03/03/2013    Beaulah Dinning, PT, DPT 02/13/19 1:34 PM  [facility] [address] [place], [place], [address] Phone: [phone]   Fax:  [phone]  Name: Anne Mills MRN: [id number] Date of Birth: 1946/11/12

## 2019-02-18 ENCOUNTER — Other Ambulatory Visit: Payer: Self-pay

## 2019-02-18 ENCOUNTER — Encounter: Payer: Self-pay | Admitting: Osteopathic Medicine

## 2019-02-18 ENCOUNTER — Ambulatory Visit (INDEPENDENT_AMBULATORY_CARE_PROVIDER_SITE_OTHER): Payer: Medicare Other | Admitting: Osteopathic Medicine

## 2019-02-18 VITALS — BP 118/62 | HR 87 | Temp 97.9°F | Resp 12 | Ht 61.0 in | Wt 121.4 lb

## 2019-02-18 DIAGNOSIS — S39012A Strain of muscle, fascia and tendon of lower back, initial encounter: Secondary | ICD-10-CM

## 2019-02-18 MED ORDER — CYCLOBENZAPRINE HCL 5 MG PO TABS: 5.0000 mg | ORAL_TABLET | Freq: Three times a day (TID) | ORAL | 0 refills | Status: DC | PRN

## 2019-02-18 MED ORDER — KETOROLAC TROMETHAMINE 30 MG/ML IJ SOLN
30.0000 mg | Freq: Once | INTRAMUSCULAR | Status: AC
  Administered 2019-02-18: 30 mg via INTRAMUSCULAR

## 2019-02-18 MED ORDER — KETOROLAC TROMETHAMINE 60 MG/2ML IM SOLN: 60.0000 mg | Freq: Once | INTRAMUSCULAR | Status: DC

## 2019-02-18 NOTE — Patient Instructions (Addendum)
Plan:  Shot of anti-inflammatory today  Continue ibuprofen, up to 800 mg 3 times per day  I sent prescription for cyclobenzaprine, muscle relaxer  See printed instructions for low back pain  If pain persists throughout the next week or 2, may consider formal physical therapy or follow-up with your sports medicine doctor    If you have lab work done today you will be contacted with your lab results within the next 2 weeks.  If you have not heard from [place] then please contact us. The fastest way to get your results is to register for My Chart.   IF you received an x-ray today, you will receive an invoice from [facility]. Please contact [facility] at [phone] with questions or concerns regarding your invoice.   IF you received labwork today, you will receive an invoice from [place]. Please contact [facility] at [phone] with questions or concerns regarding your invoice.   Our billing staff will not be able to assist you with questions regarding bills from these companies.  You will be contacted with the lab results as soon as they are available. The fastest way to get your results is to activate your My Chart account. Instructions are located on the last page of this paperwork. If you have not heard from [place] regarding the results in 2 weeks, please contact this office.

## 2019-02-18 NOTE — Progress Notes (Signed)
HPI: Anne Mills is a 73 y.o. female who  has a past medical history of Allergy, Asthma, Back pain, chronic, Chest pain, GERD (gastroesophageal reflux disease), Headache(784.0), Heart murmur, Scoliosis, Seasonal allergies, and Shortness of breath.  she presents to [facility] today, 02/18/19,  for chief complaint of:  Chief Complaint  Patient presents with  . Back Pain       Poor historian. Low back pain on the left side started a week ago (02/10/19) pain gets worse and worse throughout the day and w/ walking or sitting too long, no recent injury though she was dancing a few day sprior to the pain starting.  Similar episode in August 2019.   MR 06/2015 lumbar spine:  IMPRESSION: Curvature convex to the left with the apex at L2-3. L1-2: Small focal left posterior lateral disc herniation with mild encroachment upon the left lateral recess. This could be symptomatic, but definite neural compression is not demonstrated. L3-4: Bilateral facet arthropathy with 3 mm of anterolisthesis. Circumferential bulging of the disc. Narrowing of both lateral recesses. This could worsen with standing or flexion. Findings at this level could be symptomatic. L4-5: Shallow broad-based disc herniation. Mild facet and ligamentous hypertrophy. Narrowing of the lateral recesses left more than right. Neural compression could possibly occur on the left.        Past medical history, surgical history, and family history reviewed.  Current medication list and allergy/intolerance information reviewed.   (See remainder of HPI, ROS, Phys Exam below)       ASSESSMENT/PLAN:   Strain of lumbar region, initial encounter - Plan: ketorolac (TORADOL) 30 MG/ML injection 30 mg, DISCONTINUED: ketorolac (TORADOL) injection 60 mg   Meds ordered this encounter  Medications  . DISCONTD: ketorolac (TORADOL) injection 60 mg  . cyclobenzaprine (FLEXERIL) 5 MG tablet    Sig: Take 1-2 tablets (5-10  mg total) by mouth 3 (three) times daily as needed for muscle spasms (use lowest effective dose).    Dispense:  60 tablet    Refill:  0  . ketorolac (TORADOL) 30 MG/ML injection 30 mg    Patient Instructions   Plan:  Shot of anti-inflammatory today  Continue ibuprofen, up to 800 mg 3 times per day  I sent prescription for cyclobenzaprine, muscle relaxer  See printed instructions for low back pain  If pain persists throughout the next week or 2, may consider formal physical therapy or follow-up with your sports medicine doctor    If you have lab work done today you will be contacted with your lab results within the next 2 weeks.  If you have not heard from [place] then please contact us. The fastest way to get your results is to register for My Chart.   IF you received an x-ray today, you will receive an invoice from [facility]. Please contact [facility] at [phone] with questions or concerns regarding your invoice.   IF you received labwork today, you will receive an invoice from [place]. Please contact [facility] at [phone] with questions or concerns regarding your invoice.   Our billing staff will not be able to assist you with questions regarding bills from these companies.  You will be contacted with the lab results as soon as they are available. The fastest way to get your results is to activate your My Chart account. Instructions are located on the last page of this paperwork. If you have not heard from [place] regarding the results in 2 weeks, please contact  this office.        Follow-up plan: Return for recheck if needed, otherwise is symptoms persist please see your sports medicine doctor .     ############################################ ############################################ ############################################ ############################################    Outpatient Encounter Medications as of 02/18/2019  Medication Sig   . B Complex Vitamins (VITAMIN B-COMPLEX PO) Take by mouth. Liquid  . Calcium Carbonate-Vitamin D (CALCIUM 600 + D PO) Take 1 tablet by mouth daily.  Marland Kitchen CALCIUM PO Take 1,200 mg by mouth.  . Camphor-Menthol-Methyl Sal 1.2-5.7-6.3 % PTCH Apply topically.  . Cholecalciferol 2000 UNITS TABS Take 1 tablet by mouth daily.  Marland Kitchen EPINEPHrine (EPIPEN 2-PAK) 0.3 mg/0.3 mL IJ SOAJ injection Use as directed for severe allergic reaction  . EVENING PRIMROSE OIL PO Take 1,300 mg by mouth.  . gabapentin (NEURONTIN) 300 MG capsule Take 300 mg by mouth 3 (three) times daily.  Marland Kitchen GELATIN PO Take 1,300 mg by mouth.  Marland Kitchen ibuprofen (ADVIL,MOTRIN) 600 MG tablet Take 1 tablet (600 mg total) by mouth every 8 (eight) hours as needed.  . loratadine (CLARITIN) 10 MG tablet Take 10 mg by mouth daily.  . Magnesium 500 MG CAPS Take by mouth.  . Misc Natural Products (OSTEO BI-FLEX TRIPLE STRENGTH) TABS Take 2 tablets by mouth daily.  . Multiple Vitamins-Minerals (STRESS TAB NF) TABS Take 1 tablet by mouth daily.  Marland Kitchen NAPROXEN PO Take by mouth as needed.  . Omega-3 Fatty Acids (FISH OIL PO) Take by mouth.  Marland Kitchen OVER THE COUNTER MEDICATION Transitions w/Hesperidin vegetarian taking  . OVER THE COUNTER MEDICATION Wal-Finate Dw/Pseudoephedrine taking  . Probiotic Product (PROBIOTIC PO) Take by mouth. 10 Billion Active Cultures  . PSEUDOEPHED-CHLORPHEN-DM-GG PO Take by mouth.  Loura Pardon Salicylate (ASPERCREME EX) Apply 1 application topically 2 (two) times daily as needed (for back pain).  . cyclobenzaprine (FLEXERIL) 5 MG tablet Take 1-2 tablets (5-10 mg total) by mouth 3 (three) times daily as needed for muscle spasms (use lowest effective dose).  . [EXPIRED] ketorolac (TORADOL) 30 MG/ML injection 30 mg   . [DISCONTINUED] ketorolac (TORADOL) injection 60 mg    No facility-administered encounter medications on file as of 02/18/2019.    Allergies  Allergen Reactions  . Gluten Meal Cough  . Biaxin [Clarithromycin] Nausea And  Vomiting      Review of Systems:  Constitutional: No recent illness  HEENT: No  headache, no vision change  Respiratory:  No  shortness of breath. No  Cough  Gastrointestinal: No  abdominal pain, no change on bowel habits  Musculoskeletal: +new myalgia/arthralgia  Neurologic: No  weakness, No  Dizziness   Exam:  BP 118/62 (BP Location: Right Arm, Patient Position: Sitting, Cuff Size: Normal)   Pulse 87   Temp 97.9 F (36.6 C) (Oral)   Resp 12   Ht 5\' 1"  (1.549 m)   Wt 121 lb 6.4 oz (55.1 kg)   SpO2 97%   BMI 22.94 kg/m   Constitutional: VS see above. General Appearance: alert, well-developed, well-nourished, NAD  Eyes: Normal lids and conjunctive, non-icteric sclera  Ears, Nose, Mouth, Throat: MMM, Normal external inspection ears/nares/mouth/lips/gums.  Neck: No masses, trachea midline.   Respiratory: Normal respiratory effort.   Musculoskeletal: Gait normal. Symmetric and independent movement of all extremities.  No midline spinal tenderness.  Some tenderness to paraspinal musculature on lower lumbar area.  On left side: Negative lumbar roll, negative FABER, negative FADIR, negative straight leg raise  Neurological: Normal balance/coordination. No tremor.  Skin: warm, dry, intact.   Psychiatric:  Normal judgment/insight. Normal mood and affect. Oriented x3.   Visit summary with medication list and pertinent instructions was printed for patient to review, advised to alert [place] if any changes needed. All questions at time of visit were answered - patient instructed to contact office with any additional concerns. ER/RTC precautions were reviewed with the patient and understanding verbalized.   Follow-up plan: Return for recheck if needed, otherwise is symptoms persist please see your sports medicine doctor .    Please note: voice recognition software was used to produce this document, and typos may escape review. Please contact Dr. Sheppard Coil for any needed  clarifications.

## 2019-02-20 ENCOUNTER — Other Ambulatory Visit: Payer: Medicare Other

## 2019-02-23 ENCOUNTER — Ambulatory Visit: Payer: Medicare Other | Admitting: Sports Medicine

## 2019-02-27 ENCOUNTER — Ambulatory Visit: Payer: Medicare Other | Admitting: Physical Therapy

## 2019-03-02 ENCOUNTER — Encounter: Payer: Medicare Other | Admitting: Internal Medicine

## 2019-03-06 ENCOUNTER — Other Ambulatory Visit: Payer: Medicare Other

## 2019-03-06 ENCOUNTER — Ambulatory Visit: Payer: Medicare Other | Admitting: Physical Therapy

## 2019-03-08 ENCOUNTER — Telehealth: Payer: Self-pay | Admitting: Physical Therapy

## 2019-03-08 NOTE — Telephone Encounter (Signed)
Spoke with patient regarding the closure of the clinic for the next few weeks. Patient reports she is doing the exercises given. Therapy did offer her a telehealth option but she is not interested at this time. She would like to follow up with further therapy when the clinic re-opens.

## 2019-03-13 ENCOUNTER — Ambulatory Visit: Payer: Medicare Other | Admitting: Physical Therapy

## 2019-03-14 ENCOUNTER — Telehealth: Payer: Self-pay | Admitting: Registered Nurse

## 2019-03-14 NOTE — Telephone Encounter (Signed)
Called pt to schedule awv. Left vm requesting pt call clinic to schedule phone visit for awv.

## 2019-03-16 ENCOUNTER — Ambulatory Visit: Payer: Medicare Other | Admitting: Sports Medicine

## 2019-03-20 ENCOUNTER — Encounter: Payer: Medicare Other | Admitting: Physical Therapy

## 2019-04-27 ENCOUNTER — Other Ambulatory Visit: Payer: Medicare Other

## 2019-05-02 IMAGING — DX DG SHOULDER 2+V*R*
3 series · 3 of 3 positions shown · non-contrast
Comparison: None.

CLINICAL DATA: Fall.  Right shoulder pain.

EXAM:
RIGHT SHOULDER - 2+ VIEW

[shoulder ap]
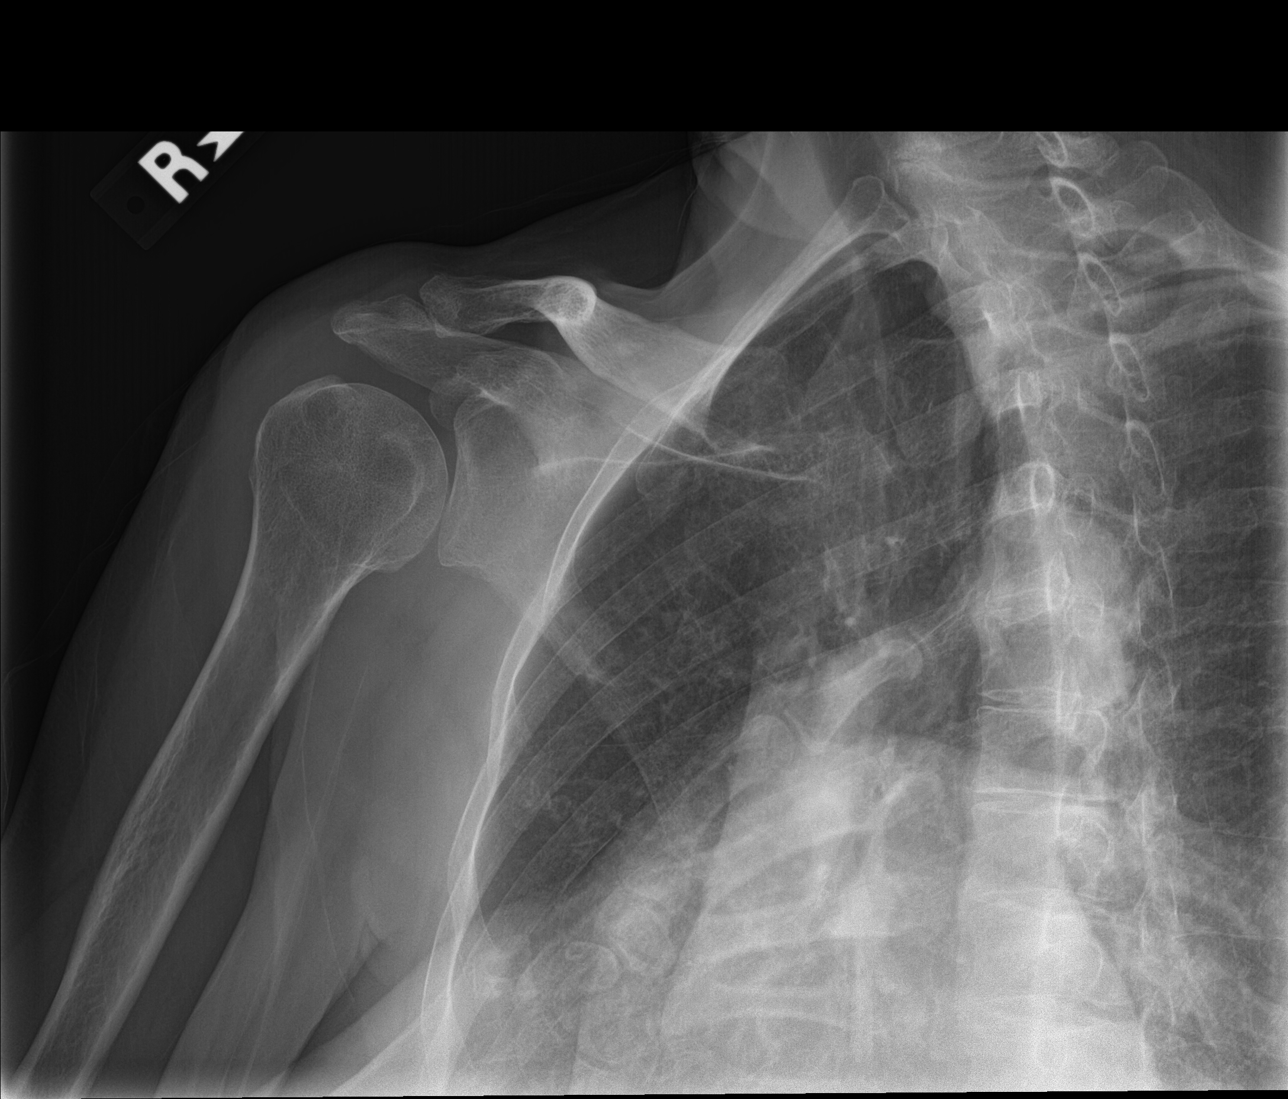

[shoulder y-view]
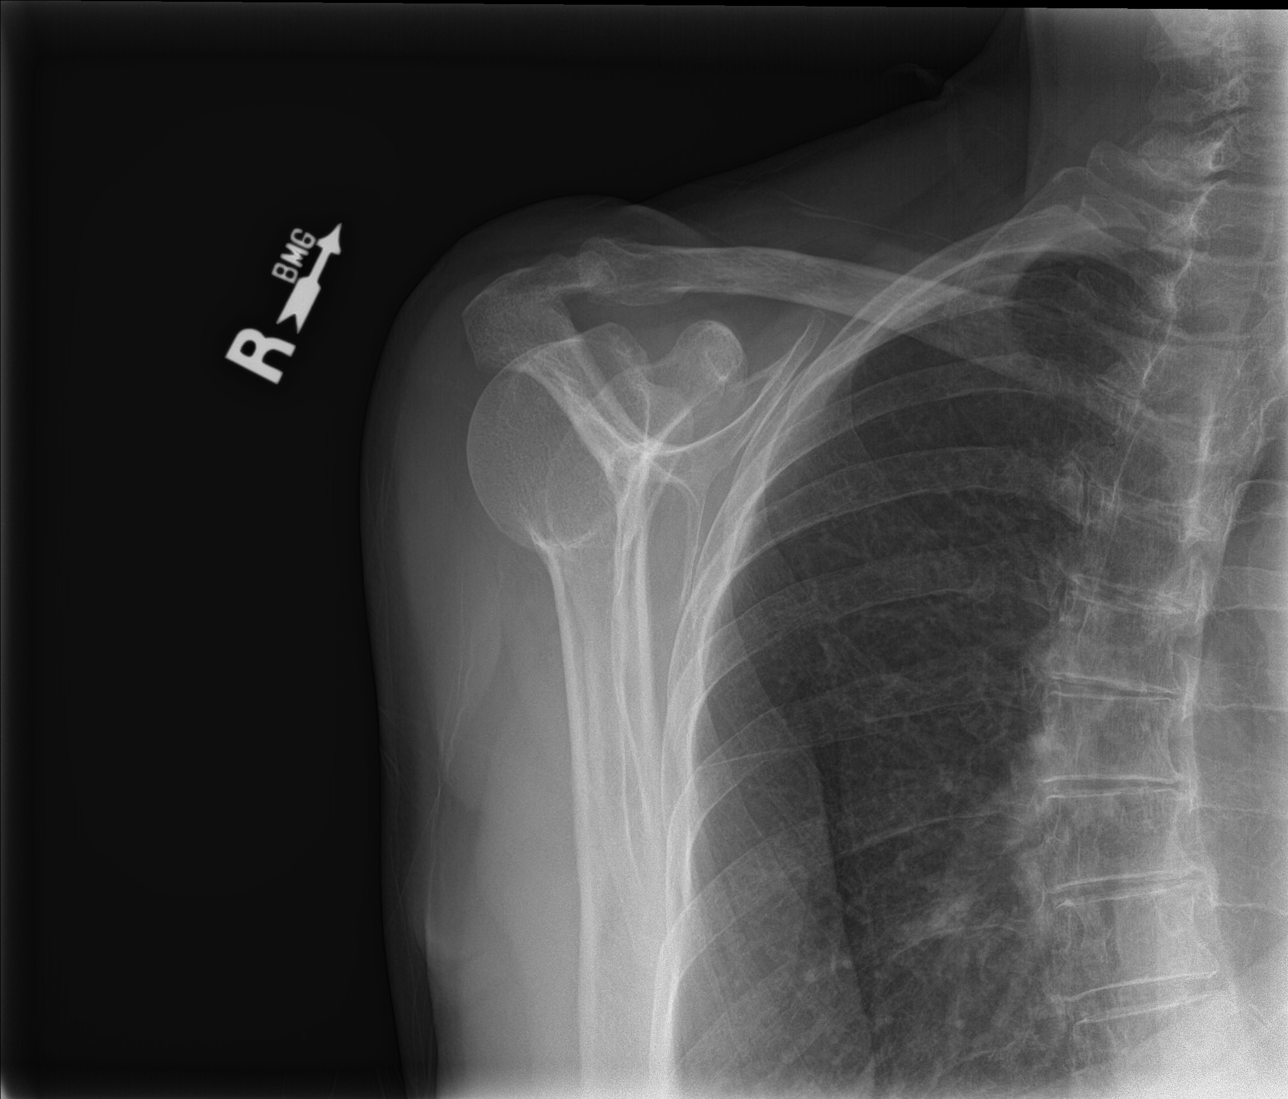

[shoulder axial]
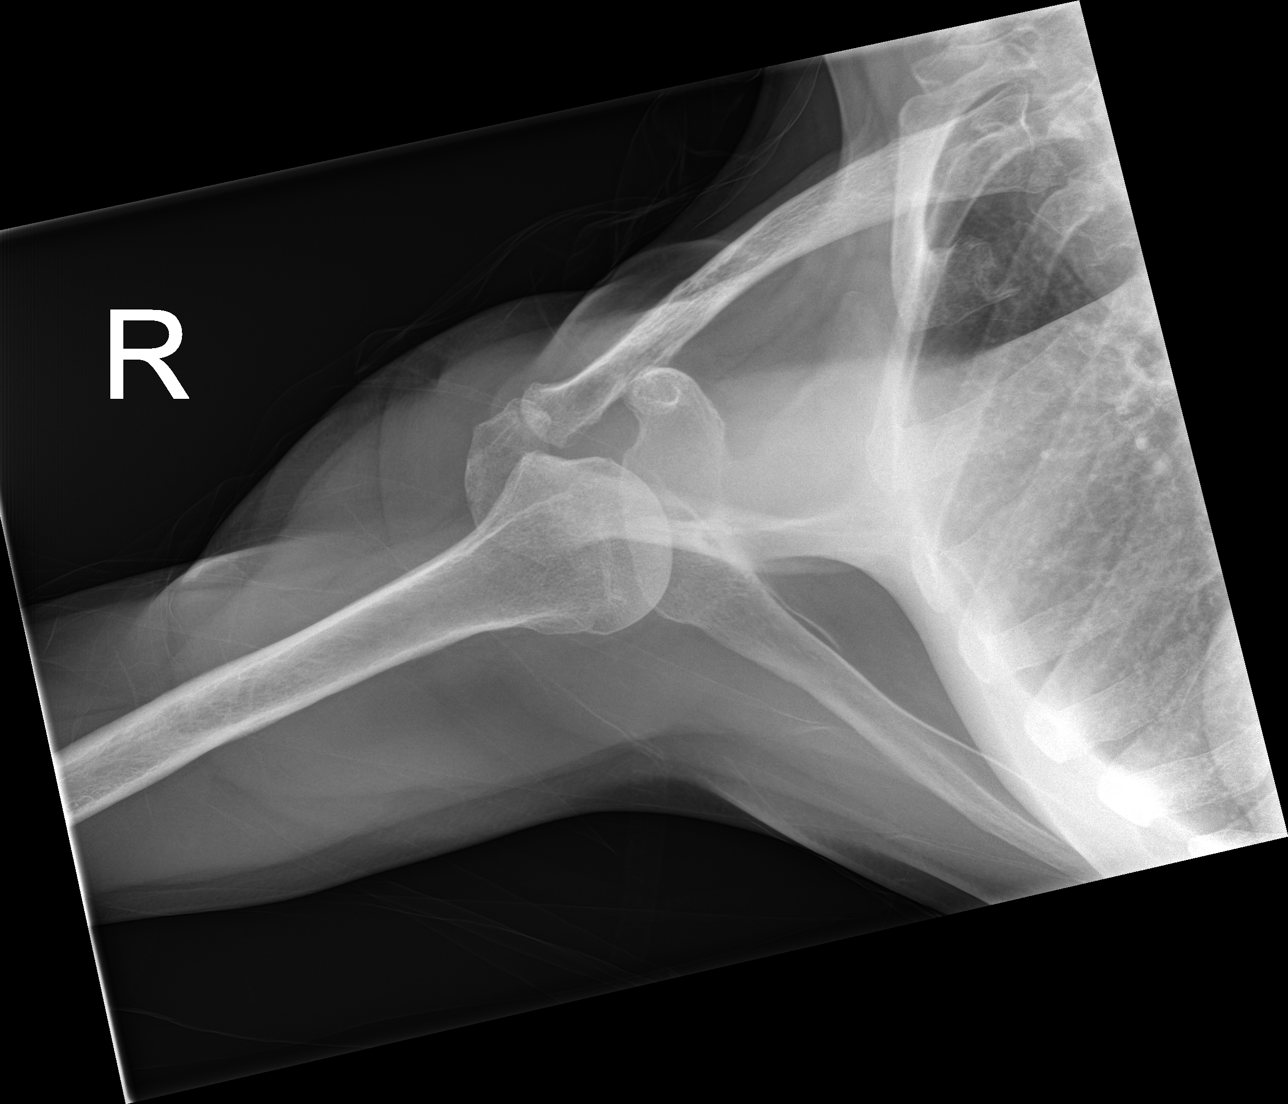

[3 of 3 positions shown; findings below may reference images not displayed]

FINDINGS: There is no evidence of fracture or dislocation. There is no
evidence of arthropathy or other focal bone abnormality. Soft
tissues are unremarkable.
IMPRESSION: No acute fracture or dislocation of the right shoulder.

## 2019-05-30 ENCOUNTER — Telehealth: Payer: Self-pay | Admitting: Family Medicine

## 2019-05-30 NOTE — Telephone Encounter (Signed)
Spoke to patient appnt scheduled

## 2019-05-30 NOTE — Telephone Encounter (Signed)
Denied. Last seen by me in Jan. Needs appointment with any provider available. thanks

## 2019-05-30 NOTE — Telephone Encounter (Signed)
Copied from [place] [id number]. Topic: Quick Communication - Rx Refill/Question >> May 30, 2019  1:53 PM Carolyn Stare wrote: Medication cyclobenzaprine (FLEXERIL) 5 MG tablet     pt is asking for a refill   Preferred [place]   Agent: Please be advised that RX refills may take up to 3 business days. We ask that you follow-up with your pharmacy.

## 2019-06-01 ENCOUNTER — Other Ambulatory Visit: Payer: Self-pay

## 2019-06-01 ENCOUNTER — Ambulatory Visit (INDEPENDENT_AMBULATORY_CARE_PROVIDER_SITE_OTHER): Payer: Medicare Other | Admitting: Family Medicine

## 2019-06-01 ENCOUNTER — Other Ambulatory Visit: Payer: Self-pay | Admitting: Family Medicine

## 2019-06-01 ENCOUNTER — Encounter: Payer: Self-pay | Admitting: Family Medicine

## 2019-06-01 VITALS — BP 111/62 | HR 62 | Temp 97.6°F | Resp 16 | Ht 61.0 in | Wt 119.0 lb

## 2019-06-01 DIAGNOSIS — M51379 Other intervertebral disc degeneration, lumbosacral region without mention of lumbar back pain or lower extremity pain: Secondary | ICD-10-CM | POA: Insufficient documentation

## 2019-06-01 DIAGNOSIS — M5137 Other intervertebral disc degeneration, lumbosacral region: Secondary | ICD-10-CM | POA: Insufficient documentation

## 2019-06-01 DIAGNOSIS — M5030 Other cervical disc degeneration, unspecified cervical region: Secondary | ICD-10-CM | POA: Diagnosis not present

## 2019-06-01 MED ORDER — CYCLOBENZAPRINE HCL 5 MG PO TABS: 5.0000 mg | ORAL_TABLET | Freq: Every day | ORAL | 1 refills | Status: DC

## 2019-06-01 NOTE — Patient Instructions (Addendum)
     If you have lab work done today you will be contacted with your lab results within the next 2 weeks.  If you have not heard from [place] then please contact us. The fastest way to get your results is to register for My Chart.   IF you received an x-ray today, you will receive an invoice from [facility]. Please contact [facility]iology at [phone] with questions or concerns regarding your invoice.   IF you received labwork today, you will receive an invoice from [place]. Please contact [facility] at [phone] with questions or concerns regarding your invoice.   Our billing staff will not be able to assist you with questions regarding bills from these companies.  You will be contacted with the lab results as soon as they are available. The fastest way to get your results is to activate your My Chart account. Instructions are located on the last page of this paperwork. If you have not heard from [place] regarding the results in 2 weeks, please contact this office.    Pt to hold on PT until July/August Pt to hold on seeing spine specialist until August Pt to hole on MRI until [place] on flexeril today

## 2019-06-01 NOTE — Progress Notes (Signed)
Acute Office Visit  Subjective:    Patient ID: Anne Mills, female    DOB: [birth year], 73 y.o.   MRN: [id number]  Chief Complaint  Patient presents with  . Spasms    pt states she is still have spasms from her hip down into her feet for months  . Medication Refill    she needs a refill on Cyclobenaprine this medication help with her spasms.     HPI Patient is in today for muscle spasms in the back and into the legs-appt in August to see spine specialist for additional evaluation. Pt with order MRI of the neck pending. Pt with no injury to the neck or back.  Pt doing exercises at home.  Pt went to PT 1-2 times prior to [place]. Pt will call to schedule follow up appt the end of July and August.   Pt states she takes flexeril at night and neurontin 3x /day for back spasms. Pt needs refills on flexeril until she can see the spine specialist for further evaluation. No change in BM. Pt with osteoporosis of the spine and osteopenia of the hips bilat   Past Medical History:  Diagnosis Date  . Allergy   . Asthma    no inhaler  . Back pain, chronic   . Chest pain    under left breast- 2-3 x per week: h/o breast surgery  . GERD (gastroesophageal reflux disease)   . Headache(784.0)   . Heart murmur    "not serious" per pt-no echo  . Scoliosis   . Seasonal allergies   . Shortness of breath    per pt due to allergies    Past Surgical History:  Procedure Laterality Date  . ABDOMINOPLASTY    . BREAST SURGERY     lt cyst  . COLONOSCOPY    . DILATION AND CURETTAGE OF UTERUS N/A 03/03/2013   Procedure: DILATATION AND CURETTAGE;  Surgeon: Lahoma Crocker, MD;  Location: [facility];  Service: Gynecology;  Laterality: N/A;  . EXAMINATION UNDER ANESTHESIA N/A 03/03/2013   Procedure: EXAM UNDER ANESTHESIA with ultrasound;  Surgeon: Lahoma Crocker, MD;  Location: [facility];  Service: Gynecology;  Laterality: N/A;  . TRIGGER FINGER RELEASE Left 06/21/2014   Procedure: LEFT THUMB TRIGGER  RELEASE;  Surgeon: Tennis Must, MD;  Location: [facility];  Service: Orthopedics;  Laterality: Left;    Family History  Problem Relation Age of Onset  . Dementia Mother   . Hypertension Mother   . Cancer Mother 39       Breast cancer  . Depression Mother   . Cancer Sister   . Diabetes Sister   . Heart disease Sister 58       AMI as cause of death  . Hyperlipidemia Sister   . Hypertension Sister   . Mental retardation Sister   . Cancer Father        prostate cancer  . Heart disease Sister 70       AMI  . Diabetes Sister     Social History   Socioeconomic History  . Marital status: Married    Spouse name: Not on file  . Number of children: Not on file  . Years of education: Not on file  . Highest education level: Not on file  Occupational History  . Occupation: retired    Comment: Development worker, community  Social Needs  . Financial resource strain: Not on file  . Food insecurity    Worry: Not on  file    Inability: Not on file  . Transportation needs    Medical: Not on file    Non-medical: Not on file  Tobacco Use  . Smoking status: Never Smoker  . Smokeless tobacco: Never Used  Substance and Sexual Activity  . Alcohol use: No  . Drug use: No  . Sexual activity: Yes    Birth control/protection: Post-menopausal  Lifestyle  . Physical activity    Days per week: Not on file    Minutes per session: Not on file  . Stress: Not on file  Relationships  . Social Herbalist on phone: Not on file    Gets together: Not on file    Attends religious service: Not on file    Active member of club or organization: Not on file    Attends meetings of clubs or organizations: Not on file    Relationship status: Not on file  . Intimate partner violence    Fear of current or ex partner: Not on file    Emotionally abused: Not on file    Physically abused: Not on file    Forced sexual activity: Not on file  Other Topics Concern  . Not on file  Social  History Narrative   Marital status: married x 52 years; second marriage; happily married.  From [place]. Left [place] age 81; grew up [place].      Children:  2 daughters; 8 grandchildren; 18 gg.      Lives: with husband, daughter.      Employment: retired; clean homes      Tobacco; none      Alcohol: none      Drugs: none      Exercise: sporadic in 2019.  [place].      ADLs: independent with ADLs; no assistant devices.      Advanced Directives:  FULL CODE; no prolonged measures.          Outpatient Medications Prior to Visit  Medication Sig Dispense Refill  . B Complex Vitamins (VITAMIN B-COMPLEX PO) Take by mouth. Liquid    . Calcium Carbonate-Vitamin D (CALCIUM 600 + D PO) Take 1 tablet by mouth daily.    Marland Kitchen CALCIUM PO Take 1,200 mg by mouth.    . Camphor-Menthol-Methyl Sal 1.2-5.7-6.3 % PTCH Apply topically.    . Cholecalciferol 2000 UNITS TABS Take 1 tablet by mouth daily.    . cyclobenzaprine (FLEXERIL) 5 MG tablet Take 1-2 tablets (5-10 mg total) by mouth 3 (three) times daily as needed for muscle spasms (use lowest effective dose). 60 tablet 0  . EPINEPHrine (EPIPEN 2-PAK) 0.3 mg/0.3 mL IJ SOAJ injection Use as directed for severe allergic reaction 2 Device 1  . EVENING PRIMROSE OIL PO Take 1,300 mg by mouth.    . gabapentin (NEURONTIN) 300 MG capsule Take 300 mg by mouth 3 (three) times daily.    Marland Kitchen GELATIN PO Take 1,300 mg by mouth.    Marland Kitchen ibuprofen (ADVIL,MOTRIN) 600 MG tablet Take 1 tablet (600 mg total) by mouth every 8 (eight) hours as needed. 30 tablet 0  . loratadine (CLARITIN) 10 MG tablet Take 10 mg by mouth daily.    . Magnesium 500 MG CAPS Take by mouth.    . Misc Natural Products (OSTEO BI-FLEX TRIPLE STRENGTH) TABS Take 2 tablets by mouth daily.    . Multiple Vitamins-Minerals (STRESS TAB NF) TABS Take 1 tablet by mouth daily.    Marland Kitchen NAPROXEN PO Take  by mouth as needed.    . Omega-3 Fatty Acids (FISH OIL PO) Take by mouth.    Marland Kitchen OVER THE COUNTER MEDICATION  Transitions w/Hesperidin vegetarian taking    . OVER THE COUNTER MEDICATION Wal-Finate Dw/Pseudoephedrine taking    . Probiotic Product (PROBIOTIC PO) Take by mouth. 10 Billion Active Cultures    . PSEUDOEPHED-CHLORPHEN-DM-GG PO Take by mouth.    Loura Pardon Salicylate (ASPERCREME EX) Apply 1 application topically 2 (two) times daily as needed (for back pain).     No facility-administered medications prior to visit.     Allergies  Allergen Reactions  . Gluten Meal Cough  . Biaxin [Clarithromycin] Nausea And Vomiting    Review of Systems  Constitutional: Negative for chills, fever and malaise/fatigue.  Musculoskeletal: Positive for back pain, joint pain and neck pain. Negative for falls.  Neurological: Negative for dizziness, tingling, focal weakness and weakness.  Endo/Heme/Allergies: Positive for environmental allergies.  no change in BM      Objective:    Physical Exam  Constitutional: She appears well-developed and well-nourished.  Musculoskeletal: Normal range of motion.        General: Tenderness present. No deformity or edema.  paraspinal tenderness thoracic and lumbar, FROM flex/extend/rotate-waist, LROM neck flex/extend/rotate  BP 111/62   Pulse 62   Temp 97.6 F (36.4 C) (Oral)   Resp 16   Ht 5\' 1"  (1.549 m)   Wt 119 lb (54 kg)   SpO2 95%   BMI 22.48 kg/m  Wt Readings from Last 3 Encounters:  06/01/19 119 lb (54 kg)  02/18/19 121 lb 6.4 oz (55.1 kg)  02/02/19 120 lb (54.4 kg)    Health Maintenance Due  Topic Date Due  . COLONOSCOPY  03/03/1997     Lab Results  Component Value Date   TSH 0.699 08/22/2014   Lab Results  Component Value Date   WBC 4.2 02/16/2018   HGB 14.9 02/16/2018   HCT 44.2 02/16/2018   MCV 90 02/16/2018   PLT 274 02/16/2018   Lab Results  Component Value Date   NA 143 02/16/2018   K 4.4 02/16/2018   CO2 26 02/16/2018   GLUCOSE 85 02/16/2018   BUN 18 02/16/2018   CREATININE 0.66 02/16/2018   BILITOT 0.8 02/16/2018    ALKPHOS 119 (H) 02/16/2018   AST 28 02/16/2018   ALT 24 02/16/2018   PROT 7.4 02/16/2018   ALBUMIN 4.7 02/16/2018   CALCIUM 9.9 02/16/2018   Lab Results  Component Value Date   CHOL 221 (H) 02/16/2018   Lab Results  Component Value Date   HDL 64 02/16/2018   Lab Results  Component Value Date   LDLCALC 136 (H) 02/16/2018   Lab Results  Component Value Date   TRIG 103 02/16/2018   Lab Results  Component Value Date   CHOLHDL 3.5 02/16/2018       Assessment & Plan:   1. DDD (degenerative disc disease), cervical Self referral to spine specialist-appt in August 2. Degeneration of lumbar or lumbosacral intervertebral disc Pt would like to hold on MRI until July/August due to COVID-flexeril-rx Pt understands risk/benefit/side effects-taking low dose at night only   Rollie Hynek Hannah Beat, MD

## 2019-06-05 ENCOUNTER — Other Ambulatory Visit: Payer: Medicare Other

## 2019-07-20 DIAGNOSIS — M5126 Other intervertebral disc displacement, lumbar region: Secondary | ICD-10-CM | POA: Diagnosis not present

## 2019-07-20 DIAGNOSIS — G894 Chronic pain syndrome: Secondary | ICD-10-CM | POA: Diagnosis not present

## 2019-07-20 DIAGNOSIS — M542 Cervicalgia: Secondary | ICD-10-CM | POA: Diagnosis not present

## 2019-07-20 DIAGNOSIS — M4316 Spondylolisthesis, lumbar region: Secondary | ICD-10-CM | POA: Diagnosis not present

## 2019-07-25 ENCOUNTER — Encounter: Payer: Self-pay | Admitting: Allergy and Immunology

## 2019-07-25 ENCOUNTER — Ambulatory Visit (INDEPENDENT_AMBULATORY_CARE_PROVIDER_SITE_OTHER): Payer: Medicare Other | Admitting: Allergy and Immunology

## 2019-07-25 DIAGNOSIS — J3089 Other allergic rhinitis: Secondary | ICD-10-CM | POA: Diagnosis not present

## 2019-07-25 NOTE — Patient Instructions (Addendum)
  1.  Restart immunotherapy and Epi-Pen when logistically convenient  2.  Continue Claritin / loratadine 10mg  daily if needed  3.  Use OTC Flonase -1 spray each nostril 1-7 times per week if needed  4. Obtain fall flu vaccine (and COVID Vaccine)  5. Return to clinic in 12 months or earlier if problem

## 2019-07-25 NOTE — Progress Notes (Signed)
[facility] [place] [facility] [place]   Follow-up Note  Referring Provider: [name], MD Primary Provider: [name], MD Date of Office Visit: 07/25/2019  Subjective:   Anne Mills (DOB: 01-29-1946) is a 73 y.o. female who returns to the [facility] on 07/25/2019 in re-evaluation of the following:  HPI: This is a E-med visit requested by patient who is located at home.  Ashana is followed in this clinic for allergic rhinoconjunctivitis and history of reflux.  Her last visit to this clinic was 24 January 2019.  Discontinued allergy injections secondary to [place] pandemic. Using flonase rarely. Overall doing well with nose. No need for antibiotics or systemic steroids for an airway issue.  Treating reflux with apple cider vinegar which is working well.   Allergies as of 07/25/2019      Reactions   Gluten Meal Cough   Biaxin [clarithromycin] Nausea And Vomiting      Medication List      ASPERCREME EX Apply 1 application topically 2 (two) times daily as needed (for back pain).   CALCIUM 600 + D PO Take 1 tablet by mouth daily.   CALCIUM PO Take 1,200 mg by mouth.   Camphor-Menthol-Methyl Sal 1.2-5.7-6.3 % Ptch Apply topically.   Cholecalciferol 50 MCG (2000 UT) Tabs Take 1 tablet by mouth daily.   cyclobenzaprine 5 MG tablet Commonly known as: FLEXERIL Take 1 tablet (5 mg total) by mouth at bedtime.   EPINEPHrine 0.3 mg/0.3 mL Soaj injection Commonly known as: EpiPen 2-Pak Use as directed for severe allergic reaction   EVENING PRIMROSE OIL PO Take 1,300 mg by mouth.   FISH OIL PO Take by mouth.   gabapentin 300 MG capsule Commonly known as: NEURONTIN Take 300 mg by mouth 3 (three) times daily.   GELATIN PO Take 1,300 mg by mouth.   ibuprofen 600 MG tablet Commonly known as: ADVIL Take 1 tablet (600 mg total) by mouth every 8 (eight) hours as needed.   loratadine 10 MG tablet Commonly known as:  CLARITIN Take 10 mg by mouth daily.   Magnesium 500 MG Caps Take by mouth.   NAPROXEN PO Take by mouth as needed.   Osteo Bi-Flex Triple Strength Tabs Take 2 tablets by mouth daily.   OVER THE COUNTER MEDICATION Transitions w/Hesperidin vegetarian taking   OVER THE COUNTER MEDICATION Wal-Finate Dw/Pseudoephedrine taking   PROBIOTIC PO Take by mouth. 10 Billion Active Cultures   PSEUDOEPHED-CHLORPHEN-DM-GG PO Take by mouth.   Stress Tab NF Tabs Take 1 tablet by mouth daily.   VITAMIN B-COMPLEX PO Take by mouth. Liquid       Past Medical History:  Diagnosis Date  . Allergy   . Asthma    no inhaler  . Back pain, chronic   . Chest pain    under left breast- 2-3 x per week: h/o breast surgery  . GERD (gastroesophageal reflux disease)   . Headache(784.0)   . Heart murmur    "not serious" per pt-no echo  . Scoliosis   . Seasonal allergies   . Shortness of breath    per pt due to allergies    Past Surgical History:  Procedure Laterality Date  . ABDOMINOPLASTY    . BREAST SURGERY     lt cyst  . COLONOSCOPY    . DILATION AND CURETTAGE OF UTERUS N/A 03/03/2013   Procedure: DILATATION AND CURETTAGE;  Surgeon: Lahoma Crocker, MD;  Location: [facility];  Service: Gynecology;  Laterality: N/A;  . EXAMINATION UNDER ANESTHESIA N/A 03/03/2013   Procedure: EXAM UNDER ANESTHESIA with ultrasound;  Surgeon: Lahoma Crocker, MD;  Location: [facility];  Service: Gynecology;  Laterality: N/A;  . TRIGGER FINGER RELEASE Left 06/21/2014   Procedure: LEFT THUMB TRIGGER RELEASE;  Surgeon: Tennis Must, MD;  Location: [facility];  Service: Orthopedics;  Laterality: Left;    Review of systems negative except as noted in HPI / PMHx or noted below:  Review of Systems  Constitutional: Negative.   HENT: Negative.   Eyes: Negative.   Respiratory: Negative.   Cardiovascular: Negative.   Gastrointestinal: Negative.   Genitourinary: Negative.   Musculoskeletal:  Negative.   Skin: Negative.   Neurological: Negative.   Endo/Heme/Allergies: Negative.   Psychiatric/Behavioral: Negative.      Objective:   There were no vitals filed for this visit.        Physical Exam-deferred  Diagnostics: none  Assessment and Plan:   1. Perennial allergic rhinitis     1.  Restart immunotherapy and Epi-Pen when logistically convenient  2.  Continue Claritin / loratadine 10mg  daily if needed  3.  Use OTC Flonase -1 spray each nostril 1-7 times per week if needed  4. Obtain fall flu vaccine (and COVID Vaccine)  5. Return to clinic in 12 months or earlier if problem  Adine appears to be doing okay on her current plan of action utilized to treat her allergic rhinoconjunctivitis.  At some point she would like to restart immunotherapy and she will do so once the coronavirus pandemic becomes less of a logistical block for her to receive immunotherapy.  I did inform her that we are very careful in the clinic with temperature checks and mask and face shield and we do our best to prevent coronavirus from coming into the clinic.  I will see her back in this clinic in 1 year or earlier if there is a problem.  Allena Katz, MD Allergy / Immunology [place] [facility]

## 2019-07-31 DIAGNOSIS — H2513 Age-related nuclear cataract, bilateral: Secondary | ICD-10-CM | POA: Diagnosis not present

## 2019-07-31 DIAGNOSIS — H04123 Dry eye syndrome of bilateral lacrimal glands: Secondary | ICD-10-CM | POA: Diagnosis not present

## 2019-07-31 DIAGNOSIS — H1013 Acute atopic conjunctivitis, bilateral: Secondary | ICD-10-CM | POA: Diagnosis not present

## 2019-07-31 NOTE — Progress Notes (Signed)
Patient is in home. Provider is in office. Start: 1203 pm End: 1228 pm

## 2019-10-02 ENCOUNTER — Other Ambulatory Visit: Payer: Self-pay

## 2019-10-02 ENCOUNTER — Ambulatory Visit (INDEPENDENT_AMBULATORY_CARE_PROVIDER_SITE_OTHER): Payer: Medicare Other

## 2019-10-02 DIAGNOSIS — Z23 Encounter for immunization: Secondary | ICD-10-CM

## 2019-10-27 ENCOUNTER — Ambulatory Visit (INDEPENDENT_AMBULATORY_CARE_PROVIDER_SITE_OTHER): Payer: Medicare Other

## 2019-10-27 ENCOUNTER — Telehealth: Payer: Self-pay | Admitting: Family Medicine

## 2019-10-27 ENCOUNTER — Other Ambulatory Visit: Payer: Self-pay

## 2019-10-27 ENCOUNTER — Ambulatory Visit (HOSPITAL_COMMUNITY)
Admission: EM | Admit: 2019-10-27 | Discharge: 2019-10-27 | Disposition: A | Discharge status: Home / Self Care | Payer: Medicare Other | Attending: Emergency Medicine | Admitting: Emergency Medicine

## 2019-10-27 ENCOUNTER — Encounter (HOSPITAL_COMMUNITY): Payer: Self-pay

## 2019-10-27 DIAGNOSIS — S60011A Contusion of right thumb without damage to nail, initial encounter: Secondary | ICD-10-CM

## 2019-10-27 DIAGNOSIS — M1811 Unilateral primary osteoarthritis of first carpometacarpal joint, right hand: Secondary | ICD-10-CM | POA: Diagnosis not present

## 2019-10-27 NOTE — Telephone Encounter (Signed)
10/27/2019 - PATIENT CALLED TO FIND OUT WHY SHE HAD NOT RECEIVED A CALL BACK REGARDING HER THUMB BEING PURPLE FOR 3 DAYS. I ASKED [facility]ER[facility] WHAT WE SHOULD DO SINCE WE DO NOT HAVE ANY MORE OPENINGS TODAY. [facility]ERRA SUGGESTED SHE GO TO THE E.R. OR A URGENT CARE WALK-IN CLINIC. I GAVE THE PATIENT [facility] AND [facility] TELEPHONE NUMBERS. SHE SAID SHE WOULD CALL THEM TO SEE WHICH ONE SHE WILL GO TO. [place]

## 2019-10-27 NOTE — Discharge Instructions (Signed)
Your xray looks well, there is some arthritis to your thumb, which is likely why you have the mild pain.  You may take ibuprofen as needed to help with pain and swelling, monitor your thumb.  If it continues to darken in color, become numb or no sensation, a wound opens, please go to the ER.  Please follow up with your primary care provider as needed if symptoms persist.

## 2019-10-27 NOTE — Telephone Encounter (Signed)
Copied from [place] [id number]. Topic: General - Inquiry >> Oct 27, 2019 12:16 PM Alease Frame wrote: Reason for [id number] called right thumb pain .please advise

## 2019-10-27 NOTE — ED Provider Notes (Signed)
[name]    CSN: [id number] Arrival date & time: 10/27/19  1811      History   Chief Complaint Chief Complaint  Patient presents with  . APPT 6:10- rt thumb pain/swelling    HPI Anne Mills is a 73 y.o. female.   [name] presents with complaints of swelling to right thumb which she originally noted approximately 5 days ago. It improved for a day, and then yesterday it worsened again and skin has become darkened/ bruised appearing. Mild pain to dorsum of pain and at DIP joint if touched. No injury to the thumb. She is right handed. She has been at home, had done a lot of dishes but denies any repetitive work/movement of the thumb. No known injury. Throbbing pain at times. She is not on a blood thinner. Sometimes a numbness sensation but overall sensation is intact. Hasn't taken any medications for symptoms. Has had surgery to left thumb for a ligament injury. No other issues with this thumb in the past, besides a nail fungus. She doesn't smoke. History  Of allergies, asthma, gerd, headaches.    ROS per HPI, negative if not otherwise mentioned.      Past Medical History:  Diagnosis Date  . Allergy   . Asthma    no inhaler  . Back pain, chronic   . Chest pain    under left breast- 2-3 x per week: h/o breast surgery  . GERD (gastroesophageal reflux disease)   . Headache(784.0)   . Heart murmur    "not serious" per pt-no echo  . Scoliosis   . Seasonal allergies   . Shortness of breath    per pt due to allergies    Patient Active Problem List   Diagnosis Date Noted  . DDD (degenerative disc disease), cervical 06/01/2019  . Degeneration of lumbar or lumbosacral intervertebral disc 06/01/2019  . Asthma 12/13/2017  . Gastroesophageal reflux disease 12/13/2017  . Other allergic rhinitis 12/13/2017  . Seasonal allergic conjunctivitis 12/13/2017  . Scoliosis (and kyphoscoliosis), idiopathic 09/15/2017  . Cervical stenosis (uterine cervix)  03/03/2013    Past Surgical History:  Procedure Laterality Date  . ABDOMINOPLASTY    . BREAST SURGERY     lt cyst  . COLONOSCOPY    . DILATION AND CURETTAGE OF UTERUS N/A 03/03/2013   Procedure: DILATATION AND CURETTAGE;  Surgeon: Lahoma Crocker, MD;  Location: [facility];  Service: Gynecology;  Laterality: N/A;  . EXAMINATION UNDER ANESTHESIA N/A 03/03/2013   Procedure: EXAM UNDER ANESTHESIA with ultrasound;  Surgeon: Lahoma Crocker, MD;  Location: [facility];  Service: Gynecology;  Laterality: N/A;  . TRIGGER FINGER RELEASE Left 06/21/2014   Procedure: LEFT THUMB TRIGGER RELEASE;  Surgeon: Tennis Must, MD;  Location: [facility];  Service: Orthopedics;  Laterality: Left;    OB History   No obstetric history on file.      Home Medications    Prior to Admission medications   Medication Sig Start Date End Date Taking? Authorizing Provider  B Complex Vitamins (VITAMIN B-COMPLEX PO) Take by mouth. Liquid    [provider]  Calcium Carbonate-Vitamin D (CALCIUM 600 + D PO) Take 1 tablet by mouth daily.    [provider]  CALCIUM PO Take 1,200 mg by mouth.    [provider]  Camphor-Menthol-Methyl Sal 1.2-5.7-6.3 % PTCH Apply topically.    [provider]  Cholecalciferol 2000 UNITS TABS Take 1 tablet by mouth daily.    [provider]  cyclobenzaprine (FLEXERIL) 5 MG tablet Take 1 tablet (5 mg total) by mouth at bedtime. 06/01/19   [name], MD  EPINEPHrine (EPIPEN 2-PAK) 0.3 mg/0.3 mL IJ SOAJ injection Use as directed for severe allergic reaction 07/07/18   Kozlow, [name], MD  EVENING PRIMROSE OIL PO Take 1,300 mg by mouth.    [provider]  gabapentin (NEURONTIN) 300 MG capsule Take 300 mg by mouth 3 (three) times daily.    [provider]  GELATIN PO Take 1,300 mg by mouth.    [provider]  ibuprofen (ADVIL,MOTRIN) 600 MG tablet Take 1 tablet (600 mg total) by mouth every 8 (eight) hours  as needed. 11/02/18   [name], MD  loratadine (CLARITIN) 10 MG tablet Take 10 mg by mouth daily.    [provider]  Magnesium 500 MG CAPS Take by mouth.    [provider]  Misc Natural Products (OSTEO BI-FLEX TRIPLE STRENGTH) TABS Take 2 tablets by mouth daily.    [provider]  Multiple Vitamins-Minerals (STRESS TAB NF) TABS Take 1 tablet by mouth daily.    [provider]  NAPROXEN PO Take by mouth as needed.    [provider]  Omega-3 Fatty Acids (FISH OIL PO) Take by mouth.    [provider]  OVER THE COUNTER MEDICATION Transitions w/Hesperidin vegetarian taking    [provider]  OVER THE COUNTER MEDICATION Wal-Finate Dw/Pseudoephedrine taking    [provider]  Probiotic Product (PROBIOTIC PO) Take by mouth. 10 Billion Active Cultures    [provider]  PSEUDOEPHED-CHLORPHEN-DM-GG PO Take by mouth.    [provider]  Trolamine Salicylate (ASPERCREME EX) Apply 1 application topically 2 (two) times daily as needed (for back pain).    [provider]    Family History Family History  Problem Relation Age of Onset  . Dementia Mother   . Hypertension Mother   . Cancer Mother 76       Breast cancer  . Depression Mother   . Cancer Sister   . Diabetes Sister   . Heart disease Sister 66       AMI as cause of death  . Hyperlipidemia Sister   . Hypertension Sister   . Mental retardation Sister   . Cancer Father        prostate cancer  . Heart disease Sister 61       AMI  . Diabetes Sister     Social History Social History   Tobacco Use  . Smoking status: Never Smoker  . Smokeless tobacco: Never Used  Substance Use Topics  . Alcohol use: No  . Drug use: No     Allergies   Gluten meal and Biaxin [clarithromycin]   Review of Systems Review of Systems   Physical Exam Triage Vital Signs ED Triage Vitals  Enc Vitals Group     BP 10/27/19 1833 115/67      Pulse Rate 10/27/19 1833 91     Resp 10/27/19 1833 16     Temp 10/27/19 1833 98.3 F (36.8 C)     Temp Source 10/27/19 1833 Oral     SpO2 10/27/19 1833 99 %     Weight --      Height --      Head Circumference --      Peak Flow --      Pain Score 10/27/19 1840 2     Pain Loc --  Pain Edu? --      Excl. in [place]? --    No data found.  Updated Vital Signs BP 115/67 (BP Location: Left Arm)   Pulse 91   Temp 98.3 F (36.8 C) (Oral)   Resp 16   SpO2 99%   Visual Acuity Right Eye Distance:   Left Eye Distance:   Bilateral Distance:    Right Eye Near:   Left Eye Near:    Bilateral Near:     Physical Exam Constitutional:      General: She is not in acute distress.    Appearance: She is well-developed.  Cardiovascular:     Rate and Rhythm: Normal rate.  Pulmonary:     Effort: Pulmonary effort is normal.  Musculoskeletal:     Right hand: She exhibits tenderness, bony tenderness and swelling. She exhibits normal range of motion, normal two-point discrimination, normal capillary refill, no deformity and no laceration. Normal sensation noted. Normal strength noted.     Comments: Right thumb with bruising appearance to palmar aspect; gross sensation intact; nail blanching, <2 seconds; full ROM of thumb; mild swelling and mild tenderness on palpation to MCP joint and PIP joint of right thumb; full ROM; no warmth; pad of finger which is bruised appearing is soft and nontender   Skin:    General: Skin is warm and dry.  Neurological:     Mental Status: She is alert and oriented to person, place, and time.      UC Treatments / Results  Labs (all labs ordered are listed, but only abnormal results are displayed) Labs Reviewed - No data to display  EKG   Radiology Dg Finger Thumb Right  Result Date: 10/27/2019 CLINICAL DATA:  Pain, swelling and bruising in the right thumb for several days. No reported injury. EXAM: RIGHT THUMB 2+V COMPARISON:  None. FINDINGS: No fracture  or dislocation. No suspicious focal osseous lesion. No osseous erosions or periosteal reaction. Minimal interphalangeal joint osteoarthritis in the right thumb. No radiopaque foreign body. IMPRESSION: No acute osseous abnormality. Minimal interphalangeal joint right thumb osteoarthritis. Electronically Signed   By: Ilona Sorrel M.D.   On: 10/27/2019 19:28    Procedures Procedures (including critical care time)  Medications Ordered in UC Medications - No data to display  Initial Impression / Assessment and Plan / UC Course  I have reviewed the triage vital signs and the nursing notes.  Pertinent labs & imaging results that were available during my care of the patient were reviewed by me and considered in my medical decision making (see chart for details).     No indication of infection. No known injury. No source of bruising per patient recollection. Erythema vs vascular change to palmar aspect of thumb discussed and considered. Mild joint tenderness with some OA on xray. Nail blanching with normal cap refill although region of bruising without blanching tissue; full ROM of thumb. Doesn't smoke. No clotting history. Dorsal aspect of thumb with normal skin tone. nsaids recommended at this time with er precautions discussed. Patient states she has a Copy she will follow up with prn. Patient verbalized understanding and agreeable to plan.   Final Clinical Impressions(s) / UC Diagnoses   Final diagnoses:  Contusion of right thumb without damage to nail, initial encounter  Arthritis of carpometacarpal [facility]) joint of right thumb     Discharge Instructions     Your xray looks well, there is some arthritis to your thumb, which is likely why you have the  mild pain.  You may take ibuprofen as needed to help with pain and swelling, monitor your thumb.  If it continues to darken in color, become numb or no sensation, a wound opens, please go to the ER.  Please follow up with your primary  care provider as needed if symptoms persist.      ED Prescriptions    None     PDMP not reviewed this encounter.   Zigmund Gottron, NP 10/27/19 2230

## 2019-10-27 NOTE — ED Triage Notes (Signed)
Pt presents to UC w/ c/o right thumb pain/swelling x4-5 days. Pt states she does not recall injuring it. Inner part of thumb is bruised.

## 2019-10-27 NOTE — Telephone Encounter (Signed)
Noted  

## 2020-01-10 ENCOUNTER — Ambulatory Visit (INDEPENDENT_AMBULATORY_CARE_PROVIDER_SITE_OTHER): Payer: Medicare Other | Admitting: Family Medicine

## 2020-01-10 VITALS — BP 115/67 | Ht 61.0 in | Wt 119.0 lb

## 2020-01-10 DIAGNOSIS — Z0000 Encounter for general adult medical examination without abnormal findings: Secondary | ICD-10-CM

## 2020-01-10 NOTE — Patient Instructions (Signed)
Thank you for taking time to come for your Medicare Wellness Visit. I appreciate your ongoing commitment to your health goals. Please review the following plan we discussed and let me know if I can assist you in the future.  Julie Greer LPN  Preventive Care 65 Years and Older, Female Preventive care refers to lifestyle choices and visits with your health care provider that can promote health and wellness. This includes:  A yearly physical exam. This is also called an annual well check.  Regular dental and eye exams.  Immunizations.  Screening for certain conditions.  Healthy lifestyle choices, such as diet and exercise. What can I expect for my preventive care visit? Physical exam Your health care provider will check:  Height and weight. These may be used to calculate body mass index (BMI), which is a measurement that tells if you are at a healthy weight.  Heart rate and blood pressure.  Your skin for abnormal spots. Counseling Your health care provider may ask you questions about:  Alcohol, tobacco, and drug use.  Emotional well-being.  Home and relationship well-being.  Sexual activity.  Eating habits.  History of falls.  Memory and ability to understand (cognition).  Work and work environment.  Pregnancy and menstrual history. What immunizations do I need?  Influenza (flu) vaccine  This is recommended every year. Tetanus, diphtheria, and pertussis (Tdap) vaccine  You may need a Td booster every 10 years. Varicella (chickenpox) vaccine  You may need this vaccine if you have not already been vaccinated. Zoster (shingles) vaccine  You may need this after age 60. Pneumococcal conjugate (PCV13) vaccine  One dose is recommended after age 65. Pneumococcal polysaccharide (PPSV23) vaccine  One dose is recommended after age 65. Measles, mumps, and rubella (MMR) vaccine  You may need at least one dose of MMR if you were born in 1957 or later. You may also  need a second dose. Meningococcal conjugate (MenACWY) vaccine  You may need this if you have certain conditions. Hepatitis A vaccine  You may need this if you have certain conditions or if you travel or work in places where you may be exposed to hepatitis A. Hepatitis B vaccine  You may need this if you have certain conditions or if you travel or work in places where you may be exposed to hepatitis B. Haemophilus influenzae type b (Hib) vaccine  You may need this if you have certain conditions. You may receive vaccines as individual doses or as more than one vaccine together in one shot (combination vaccines). Talk with your health care provider about the risks and benefits of combination vaccines. What tests do I need? Blood tests  Lipid and cholesterol levels. These may be checked every 5 years, or more frequently depending on your overall health.  Hepatitis C test.  Hepatitis B test. Screening  Lung cancer screening. You may have this screening every year starting at age 55 if you have a 30-pack-year history of smoking and currently smoke or have quit within the past 15 years.  Colorectal cancer screening. All adults should have this screening starting at age 50 and continuing until age 75. Your health care provider may recommend screening at age 45 if you are at increased risk. You will have tests every 1-10 years, depending on your results and the type of screening test.  Diabetes screening. This is done by checking your blood sugar (glucose) after you have not eaten for a while (fasting). You may have this done every 1-3   years.  Mammogram. This may be done every 1-2 years. Talk with your health care provider about how often you should have regular mammograms.  BRCA-related cancer screening. This may be done if you have a family history of breast, ovarian, tubal, or peritoneal cancers. Other tests  Sexually transmitted disease (STD) testing.  Bone density scan. This is done  to screen for osteoporosis. You may have this done starting at age 79. Follow these instructions at home: Eating and drinking  Eat a diet that includes fresh fruits and vegetables, whole grains, lean protein, and low-fat dairy products. Limit your intake of foods with high amounts of sugar, saturated fats, and salt.  Take vitamin and mineral supplements as recommended by your health care provider.  Do not drink alcohol if your health care provider tells you not to drink.  If you drink alcohol: ? Limit how much you have to 0-1 drink a day. ? Be aware of how much alcohol is in your drink. In the [place] one drink equals one 12 oz bottle of beer (355 mL), one 5 oz glass of wine (148 mL), or one 1 oz glass of hard liquor (44 mL). Lifestyle  Take daily care of your teeth and gums.  Stay active. Exercise for at least 30 minutes on 5 or more days each week.  Do not use any products that contain nicotine or tobacco, such as cigarettes, e-cigarettes, and chewing tobacco. If you need help quitting, ask your health care provider.  If you are sexually active, practice safe sex. Use a condom or other form of protection in order to prevent STIs (sexually transmitted infections).  Talk with your health care provider about taking a low-dose aspirin or statin. What's next?  Go to your health care provider once a year for a well check visit.  Ask your health care provider how often you should have your eyes and teeth checked.  Stay up to date on all vaccines. This information is not intended to replace advice given to you by your health care provider. Make sure you discuss any questions you have with your health care provider. Document Revised: 11/17/2018 Document Reviewed: 11/17/2018 Elsevier Patient Education  2020 [employer].

## 2020-01-10 NOTE — Progress Notes (Signed)
Presents today for [employer] Visit   Date of last exam:12-12-2018  Interpreter used for this visit? No  I connected with  Anne Mills on 01/10/20 by a telephone application and verified that I am speaking with the correct person using two identifiers.   I discussed the limitations of evaluation and management by telemedicine. The patient expressed understanding and agreed to proceed.    Patient Care Team: [name], MD as PCP - General (Family Medicine)   Other items to address today:   Will be getting second covid shot 01-19-2020 Discussed immunizations Discussed Eye/Dental Will call to schedule appointment   Other Screening:  Last lipid screening: 02-16-2018  ADVANCE DIRECTIVES: Discussed: yes On File: no Materials Provided: yes  Immunization status:  Immunization History  Administered Date(s) Administered  . Fluad Quad(high Dose 65+) 10/02/2019  . Influenza, High Dose Seasonal PF 10/17/2018  . Influenza,inj,Quad PF,6+ Mos 08/22/2014, 09/15/2017  . Influenza-Unspecified 10/06/2016  . Pneumococcal Polysaccharide-23 07/24/2015, 09/15/2017  . Td 07/24/2015  . Zoster 12/08/2007     Health Maintenance Due  Topic Date Due  . COLONOSCOPY  03/03/1997     Functional Status Survey: Is the patient deaf or have difficulty hearing?: No Does the patient have difficulty seeing, even when wearing glasses/contacts?: No Does the patient have difficulty concentrating, remembering, or making decisions?: No Does the patient have difficulty walking or climbing stairs?: No Does the patient have difficulty dressing or bathing?: No Does the patient have difficulty doing errands alone such as visiting a doctor's office or shopping?: No   6CIT Screen 01/10/2020  What Year? 0 points  What month? 0 points  What time? 0 points  Count back from 20 0 points  Months in reverse 0 points  Repeat phrase 0 points  Total Score 0        Clinical  Support from 01/10/2020 in [facility]  AUDIT-C Score  2       Home Environment:   Lives in a two story No trouble climbing stairs Yes  grab bars No scattered rugs Adequate lighting/ no clutter  Times warm up N/A     Patient Active Problem List   Diagnosis Date Noted  . DDD (degenerative disc disease), cervical 06/01/2019  . Degeneration of lumbar or lumbosacral intervertebral disc 06/01/2019  . Asthma 12/13/2017  . Gastroesophageal reflux disease 12/13/2017  . Other allergic rhinitis 12/13/2017  . Seasonal allergic conjunctivitis 12/13/2017  . Scoliosis (and kyphoscoliosis), idiopathic 09/15/2017  . Cervical stenosis (uterine cervix) 03/03/2013     Past Medical History:  Diagnosis Date  . Allergy   . Asthma    no inhaler  . Back pain, chronic   . Chest pain    under left breast- 2-3 x per week: h/o breast surgery  . GERD (gastroesophageal reflux disease)   . Headache(784.0)   . Heart murmur    "not serious" per pt-no echo  . Scoliosis   . Seasonal allergies   . Shortness of breath    per pt due to allergies     Past Surgical History:  Procedure Laterality Date  . ABDOMINOPLASTY    . BREAST SURGERY     lt cyst  . COLONOSCOPY    . DILATION AND CURETTAGE OF UTERUS N/A 03/03/2013   Procedure: DILATATION AND CURETTAGE;  Surgeon: Lahoma Crocker, MD;  Location: [facility];  Service: Gynecology;  Laterality: N/A;  . EXAMINATION UNDER ANESTHESIA N/A 03/03/2013   Procedure: EXAM UNDER  ANESTHESIA with ultrasound;  Surgeon: Lahoma Crocker, MD;  Location: [facility];  Service: Gynecology;  Laterality: N/A;  . TRIGGER FINGER RELEASE Left 06/21/2014   Procedure: LEFT THUMB TRIGGER RELEASE;  Surgeon: Tennis Must, MD;  Location: [facility];  Service: Orthopedics;  Laterality: Left;     Family History  Problem Relation Age of Onset  . Dementia Mother   . Hypertension Mother   . Cancer Mother 67       Breast cancer  . Depression Mother   .  Cancer Sister   . Diabetes Sister   . Heart disease Sister 72       AMI as cause of death  . Hyperlipidemia Sister   . Hypertension Sister   . Mental retardation Sister   . Cancer Father        prostate cancer  . Heart disease Sister 2       AMI  . Diabetes Sister      Social History   Socioeconomic History  . Marital status: Married    Spouse name: Not on file  . Number of children: Not on file  . Years of education: Not on file  . Highest education level: Not on file  Occupational History  . Occupation: retired    Comment: Development worker, community  Tobacco Use  . Smoking status: Never Smoker  . Smokeless tobacco: Never Used  Substance and Sexual Activity  . Alcohol use: No  . Drug use: No  . Sexual activity: Yes    Birth control/protection: Post-menopausal  Other Topics Concern  . Not on file  Social History Narrative   Marital status: married x 82 years; second marriage; happily married.  From [place]. Left [place] age 11; grew up [place].      Children:  2 daughters; 8 grandchildren; 72 gg.      Lives: with husband, daughter.      Employment: retired; clean homes      Tobacco; none      Alcohol: none      Drugs: none      Exercise: sporadic in 2019.  [place].      ADLs: independent with ADLs; no assistant devices.      Advanced Directives:  FULL CODE; no prolonged measures.         Social Determinants of Health   Financial Resource Strain:   . Difficulty of Paying Living Expenses: Not on file  Food Insecurity:   . Worried About Charity fundraiser in the Last Year: Not on file  . Ran Out of Food in the Last Year: Not on file  Transportation Needs:   . Lack of Transportation (Medical): Not on file  . Lack of Transportation (Non-Medical): Not on file  Physical Activity:   . Days of Exercise per Week: Not on file  . Minutes of Exercise per Session: Not on file  Stress:   . Feeling of Stress : Not on file  Social Connections:   . Frequency of  Communication with Friends and Family: Not on file  . Frequency of Social Gatherings with Friends and Family: Not on file  . Attends Religious Services: Not on file  . Active Member of Clubs or Organizations: Not on file  . Attends Archivist Meetings: Not on file  . Marital Status: Not on file  Intimate Partner Violence:   . Fear of Current or Ex-Partner: Not on file  . Emotionally Abused: Not on file  .  Physically Abused: Not on file  . Sexually Abused: Not on file     Allergies  Allergen Reactions  . Gluten Meal Cough  . Biaxin [Clarithromycin] Nausea And Vomiting     Prior to Admission medications   Medication Sig Start Date End Date Taking? Authorizing Provider  B Complex Vitamins (VITAMIN B-COMPLEX PO) Take by mouth. Liquid   Yes [provider]  Calcium Carbonate-Vitamin D (CALCIUM 600 + D PO) Take 1 tablet by mouth daily.   Yes [provider]  CALCIUM PO Take 1,200 mg by mouth.   Yes [provider]  Camphor-Menthol-Methyl Sal 1.2-5.7-6.3 % PTCH Apply topically.   Yes [provider]  Cholecalciferol 2000 UNITS TABS Take 1 tablet by mouth daily.   Yes [provider]  cyclobenzaprine (FLEXERIL) 5 MG tablet Take 1 tablet (5 mg total) by mouth at bedtime. 06/01/19  Yes Corum, Rex Kras, MD  EPINEPHrine (EPIPEN 2-PAK) 0.3 mg/0.3 mL IJ SOAJ injection Use as directed for severe allergic reaction 07/07/18  Yes Kozlow, [name], MD  EVENING PRIMROSE OIL PO Take 1,300 mg by mouth.   Yes [provider]  gabapentin (NEURONTIN) 300 MG capsule Take 300 mg by mouth 3 (three) times daily.   Yes [provider]  ibuprofen (ADVIL,MOTRIN) 600 MG tablet Take 1 tablet (600 mg total) by mouth every 8 (eight) hours as needed. 11/02/18  Yes [name], MD  loratadine (CLARITIN) 10 MG tablet Take 10 mg by mouth daily.   Yes [provider]  Magnesium 500 MG CAPS Take by mouth.   Yes [provider]  Misc  Natural Products (OSTEO BI-FLEX TRIPLE STRENGTH) TABS Take 2 tablets by mouth daily.   Yes [provider]  Multiple Vitamins-Minerals (STRESS TAB NF) TABS Take 1 tablet by mouth daily.   Yes [provider]  NAPROXEN PO Take by mouth as needed.   Yes [provider]  NON FORMULARY cbd oil / cbd cream   Yes [provider]  OVER THE COUNTER MEDICATION Transitions w/Hesperidin vegetarian taking   Yes [provider]  OVER THE COUNTER MEDICATION Wal-Finate Dw/Pseudoephedrine taking   Yes [provider]  Probiotic Product (PROBIOTIC PO) Take by mouth. 10 Billion Active Cultures   Yes [provider]  PSEUDOEPHED-CHLORPHEN-DM-GG PO Take by mouth.   Yes [provider]  GELATIN PO Take 1,300 mg by mouth.    [provider]  Omega-3 Fatty Acids (FISH OIL PO) Take by mouth.    [provider]  Trolamine Salicylate (ASPERCREME EX) Apply 1 application topically 2 (two) times daily as needed (for back pain).    [provider]     Depression screen [facility] 2/9 01/10/2020 06/01/2019 02/18/2019 12/12/2018 11/02/2018  Decreased Interest 0 0 0 0 0  Down, Depressed, Hopeless 0 0 0 0 0  PHQ - 2 Score 0 0 0 0 0     Fall Risk  01/10/2020 06/01/2019 02/18/2019 12/12/2018 11/02/2018  Falls in the past year? 0 0 0 0 0  Number falls in past yr: 0 - 0 - -  Injury with Fall? 0 0 0 - -  Follow up Falls evaluation completed;Education provided - Falls evaluation completed - -      PHYSICAL EXAM: BP 115/67 Comment: taken from a previous visit  Ht 5\' 1"  (1.549 m)   Wt 119 lb (54 kg)   BMI 22.48 kg/m    Wt Readings from Last 3 Encounters:  01/10/20 119  lb (54 kg)  06/01/19 119 lb (54 kg)  02/18/19 121 lb 6.4 oz (55.1 kg)       Education/Counseling provided regarding diet and exercise, prevention of chronic diseases, smoking/tobacco cessation, if applicable, and reviewed "Covered Medicare Preventive  Services."

## 2020-01-31 DIAGNOSIS — Z1231 Encounter for screening mammogram for malignant neoplasm of breast: Secondary | ICD-10-CM | POA: Diagnosis not present

## 2020-02-08 ENCOUNTER — Encounter: Payer: Self-pay | Admitting: Family Medicine

## 2020-02-08 DIAGNOSIS — N6489 Other specified disorders of breast: Secondary | ICD-10-CM | POA: Diagnosis not present

## 2020-02-09 DIAGNOSIS — M542 Cervicalgia: Secondary | ICD-10-CM | POA: Diagnosis not present

## 2020-02-09 DIAGNOSIS — M4722 Other spondylosis with radiculopathy, cervical region: Secondary | ICD-10-CM | POA: Diagnosis not present

## 2020-02-09 DIAGNOSIS — Z6822 Body mass index (BMI) 22.0-22.9, adult: Secondary | ICD-10-CM | POA: Diagnosis not present

## 2020-02-09 DIAGNOSIS — M5412 Radiculopathy, cervical region: Secondary | ICD-10-CM | POA: Diagnosis not present

## 2020-02-12 ENCOUNTER — Telehealth: Payer: Self-pay

## 2020-02-12 NOTE — Telephone Encounter (Signed)
Spoke with pt in reference mammogram, she has been referred to breast surgeon. Says she has not gotten a call as of yet. She will follow up after the appt.

## 2020-02-19 ENCOUNTER — Other Ambulatory Visit: Payer: Self-pay | Admitting: Sports Medicine

## 2020-02-19 ENCOUNTER — Other Ambulatory Visit: Payer: Self-pay | Admitting: Orthopedic Surgery

## 2020-02-19 DIAGNOSIS — M542 Cervicalgia: Secondary | ICD-10-CM

## 2020-02-28 DIAGNOSIS — N6320 Unspecified lump in the left breast, unspecified quadrant: Secondary | ICD-10-CM | POA: Diagnosis not present

## 2020-03-05 ENCOUNTER — Telehealth: Payer: Self-pay | Admitting: Family Medicine

## 2020-03-05 NOTE — Telephone Encounter (Signed)
Please Advise

## 2020-03-05 NOTE — Telephone Encounter (Signed)
Patient  has an MRI coming up 03/16/2020 would Dr.Santiago be able to call  A small script in for her anxiety

## 2020-03-07 MED ORDER — ALPRAZOLAM 0.5 MG PO TABS: 0.5000 mg | ORAL_TABLET | Freq: Once | ORAL | 0 refills | Status: AC

## 2020-03-07 NOTE — Telephone Encounter (Signed)
Please let patient know that I sent her 1 tab of xanax to take 1/2 hour prior to MRI to [facility] on [address]. thanks

## 2020-03-07 NOTE — Telephone Encounter (Signed)
Left message per [facility] advising dr Pamella Pert sent over 1/2 tab of xanax to take 1/2 prior to MRI procedure to [facility] on [address].  Advised to call office if she has any further questions or concerns.

## 2020-03-12 ENCOUNTER — Ambulatory Visit (INDEPENDENT_AMBULATORY_CARE_PROVIDER_SITE_OTHER): Payer: Medicare Other | Admitting: Family Medicine

## 2020-03-12 ENCOUNTER — Other Ambulatory Visit: Payer: Self-pay

## 2020-03-12 ENCOUNTER — Encounter: Payer: Self-pay | Admitting: Family Medicine

## 2020-03-12 VITALS — BP 109/67 | HR 92 | Temp 98.2°F | Ht 61.0 in | Wt 115.0 lb

## 2020-03-12 DIAGNOSIS — M810 Age-related osteoporosis without current pathological fracture: Secondary | ICD-10-CM | POA: Diagnosis not present

## 2020-03-12 DIAGNOSIS — Z1211 Encounter for screening for malignant neoplasm of colon: Secondary | ICD-10-CM | POA: Diagnosis not present

## 2020-03-12 MED ORDER — ALENDRONATE SODIUM 70 MG PO TABS: 70.0000 mg | ORAL_TABLET | ORAL | 11 refills | Status: DC

## 2020-03-12 NOTE — Patient Instructions (Signed)
° ° ° °  If you have lab work done today you will be contacted with your lab results within the next 2 weeks.  If you have not heard from us then please contact us. The fastest way to get your results is to register for My Chart. ° ° °IF you received an x-ray today, you will receive an invoice from [facility]. Please contact [facility] at [phone] with questions or concerns regarding your invoice.  ° °IF you received labwork today, you will receive an invoice from [facility]. Please contact [facility] at [phone] with questions or concerns regarding your invoice.  ° °Our billing staff will not be able to assist you with questions regarding bills from these companies. ° °You will be contacted with the lab results as soon as they are available. The fastest way to get your results is to activate your My Chart account. Instructions are located on the last page of this paperwork. If you have not heard from us regarding the results in 2 weeks, please contact this office. °  ° ° ° °

## 2020-03-12 NOTE — Progress Notes (Signed)
4/6/202111:34 AM  Anne Mills [birth year], 74 y.o., female [id number]  Chief Complaint  Patient presents with  . discuss colonscopy / bone density    HPI:   Patient is a 74 y.o. female who presents today to discuss several concerns  She is requesting referral for colonoscopy to [facility] Has received notice stating she is due for routine surveillance  She has seen spine and scoliosis, Dr Griffin Dakin  She has been scheduled for MRI c spine to evaluate Right arm pain/paresthesia  She reports she is due for dexa Her last dexa in 2019 - osteoporosis lumbar spine She takes calcium and vitamin D Has not been doing as much weight bearing exercises She has never been treated with bisphonates She denies h/o breast cancer Reports GERD well controlled with diet changes  Depression screen [facility] 2/9 01/10/2020 06/01/2019 02/18/2019  Decreased Interest 0 0 0  Down, Depressed, Hopeless 0 0 0  PHQ - 2 Score 0 0 0    Fall Risk  01/10/2020 06/01/2019 02/18/2019 12/12/2018 11/02/2018  Falls in the past year? 0 0 0 0 0  Number falls in past yr: 0 - 0 - -  Injury with Fall? 0 0 0 - -  Follow up Falls evaluation completed;Education provided - Falls evaluation completed - -     Allergies  Allergen Reactions  . Gluten Meal Cough  . Biaxin [Clarithromycin] Nausea And Vomiting    Prior to Admission medications   Medication Sig Start Date End Date Taking? Authorizing Provider  B Complex Vitamins (VITAMIN B-COMPLEX PO) Take by mouth. Liquid   Yes [provider]  Calcium Carbonate-Vitamin D (CALCIUM 600 + D PO) Take 1 tablet by mouth daily.   Yes [provider]  CALCIUM PO Take 1,200 mg by mouth.   Yes [provider]  Camphor-Menthol-Methyl Sal 1.2-5.7-6.3 % PTCH Apply topically.   Yes [provider]  Cholecalciferol 2000 UNITS TABS Take 1 tablet by mouth daily.   Yes [provider]  cyclobenzaprine (FLEXERIL) 5 MG tablet Take 1 tablet (5 mg  total) by mouth at bedtime. 06/01/19  Yes Corum, Rex Kras, MD  EPINEPHrine (EPIPEN 2-PAK) 0.3 mg/0.3 mL IJ SOAJ injection Use as directed for severe allergic reaction 07/07/18  Yes Kozlow, [name], MD  EVENING PRIMROSE OIL PO Take 1,300 mg by mouth.   Yes [provider]  gabapentin (NEURONTIN) 300 MG capsule Take 300 mg by mouth 3 (three) times daily.   Yes [provider]  ibuprofen (ADVIL,MOTRIN) 600 MG tablet Take 1 tablet (600 mg total) by mouth every 8 (eight) hours as needed. 11/02/18  Yes [name], MD  Magnesium 500 MG CAPS Take by mouth.   Yes [provider]  Misc Natural Products (OSTEO BI-FLEX TRIPLE STRENGTH) TABS Take 2 tablets by mouth daily.   Yes [provider]  Multiple Vitamins-Minerals (STRESS TAB NF) TABS Take 1 tablet by mouth daily.   Yes [provider]  NON FORMULARY cbd oil / cbd cream   Yes [provider]  OVER THE COUNTER MEDICATION Transitions w/Hesperidin vegetarian taking   Yes [provider]  OVER THE COUNTER MEDICATION Wal-Finate Dw/Pseudoephedrine taking   Yes [provider]  Probiotic Product (PROBIOTIC PO) Take by mouth. 10 Billion Active Cultures   Yes [provider]  Trolamine Salicylate (ASPERCREME EX) Apply 1 application topically 2 (two) times daily as needed (for back pain).   Yes [provider]  GELATIN PO Take 1,300  mg by mouth.    [provider]  loratadine (CLARITIN) 10 MG tablet Take 10 mg by mouth daily.    [provider]  NAPROXEN PO Take by mouth as needed.    [provider]  Omega-3 Fatty Acids (FISH OIL PO) Take by mouth.    [provider]  PSEUDOEPHED-CHLORPHEN-DM-GG PO Take by mouth.    [provider]    Past Medical History:  Diagnosis Date  . Allergy   . Asthma    no inhaler  . Back pain, chronic   . Chest pain    under left breast- 2-3 x per week: h/o breast surgery  . GERD  (gastroesophageal reflux disease)   . Headache(784.0)   . Heart murmur    "not serious" per pt-no echo  . Scoliosis   . Seasonal allergies   . Shortness of breath    per pt due to allergies    Past Surgical History:  Procedure Laterality Date  . ABDOMINOPLASTY    . BREAST SURGERY     lt cyst  . COLONOSCOPY    . DILATION AND CURETTAGE OF UTERUS N/A 03/03/2013   Procedure: DILATATION AND CURETTAGE;  Surgeon: Lahoma Crocker, MD;  Location: [facility];  Service: Gynecology;  Laterality: N/A;  . EXAMINATION UNDER ANESTHESIA N/A 03/03/2013   Procedure: EXAM UNDER ANESTHESIA with ultrasound;  Surgeon: Lahoma Crocker, MD;  Location: [facility];  Service: Gynecology;  Laterality: N/A;  . TRIGGER FINGER RELEASE Left 06/21/2014   Procedure: LEFT THUMB TRIGGER RELEASE;  Surgeon: Tennis Must, MD;  Location: [facility];  Service: Orthopedics;  Laterality: Left;    Social History   Tobacco Use  . Smoking status: Never Smoker  . Smokeless tobacco: Never Used  Substance Use Topics  . Alcohol use: No    Family History  Problem Relation Age of Onset  . Dementia Mother   . Hypertension Mother   . Cancer Mother 42       Breast cancer  . Depression Mother   . Cancer Sister   . Diabetes Sister   . Heart disease Sister 45       AMI as cause of death  . Hyperlipidemia Sister   . Hypertension Sister   . Mental retardation Sister   . Cancer Father        prostate cancer  . Heart disease Sister 23       AMI  . Diabetes Sister     ROS Per hpi  OBJECTIVE:  Today's Vitals   03/12/20 1125  BP: 109/67  Pulse: 92  Temp: 98.2 F (36.8 C)  SpO2: 99%  Weight: 115 lb (52.2 kg)  Height: 5\' 1"  (1.549 m)   Body mass index is 21.73 kg/m.   Physical Exam Vitals and nursing note reviewed.  Constitutional:      Appearance: She is well-developed.  HENT:     Head: Normocephalic and atraumatic.  Eyes:     General: No scleral icterus.    Conjunctiva/sclera: Conjunctivae  normal.     Pupils: Pupils are equal, round, and reactive to light.  Pulmonary:     Effort: Pulmonary effort is normal.  Musculoskeletal:     Cervical back: Neck supple.  Skin:    General: Skin is warm and dry.  Neurological:     Mental Status: She is alert and oriented to person, place, and time.     No results found for this or any previous visit (from the past  24 hour(s)).  No results found.   ASSESSMENT and PLAN  1. Age-related osteoporosis without current pathological fracture Discussed continue with calcium/D3, increase weight bearing exercise, fall precautions. new meds r/se/b and RTC precautions.  - Vitamin D, 99991111 - Basic Metabolic Panel - DG Bone Density; Future  2. Colon cancer screening - Ambulatory referral to Gastroenterology  Other orders - alendronate (FOSAMAX) 70 MG tablet; Take 1 tablet (70 mg total) by mouth every 7 (seven) days. Take with a full glass of water on an empty stomach.  Return in about 3 months (around 06/11/2020) for alendronate tolerance.    [name], MD [facility] [place], [place] [address] Ph.  [phone] Fax [phone]

## 2020-03-13 LAB — BASIC METABOLIC PANEL
BUN/Creatinine Ratio: 20 (ref 12–28)
BUN: 15 mg/dL (ref 8–27)
CO2: 25 mmol/L (ref 20–29)
Calcium: 9.8 mg/dL (ref 8.7–10.3)
Chloride: 105 mmol/L (ref 96–106)
Creatinine, Ser: 0.75 mg/dL (ref 0.57–1.00)
GFR calc Af Amer: 91 mL/min/{1.73_m2} (ref >59)
GFR calc non Af Amer: 79 mL/min/{1.73_m2} (ref >59)
Glucose: 72 mg/dL (ref 65–99)
Potassium: 4.4 mmol/L (ref 3.5–5.2)
Sodium: 144 mmol/L (ref 134–144)

## 2020-03-13 LAB — VITAMIN D 25 HYDROXY (VIT D DEFICIENCY, FRACTURES): Vit D, 25-Hydroxy: 54.6 ng/mL (ref 30.0–100.0)

## 2020-03-16 ENCOUNTER — Ambulatory Visit
Admission: RE | Admit: 2020-03-16 | Discharge: 2020-03-16 | Disposition: A | Discharge status: Home / Self Care | Payer: Medicare Other | Source: Ambulatory Visit | Attending: Orthopedic Surgery | Admitting: Orthopedic Surgery

## 2020-03-16 DIAGNOSIS — M542 Cervicalgia: Secondary | ICD-10-CM

## 2020-03-16 DIAGNOSIS — M4802 Spinal stenosis, cervical region: Secondary | ICD-10-CM | POA: Diagnosis not present

## 2020-03-16 IMAGING — DX DG FINGER THUMB 2+V*R*
3 series · 3 of 3 positions shown · non-contrast
Comparison: None.

CLINICAL DATA: Pain, swelling and bruising in the right thumb for
several days. No reported injury.

EXAM:
RIGHT THUMB 2+V

[finger ap]
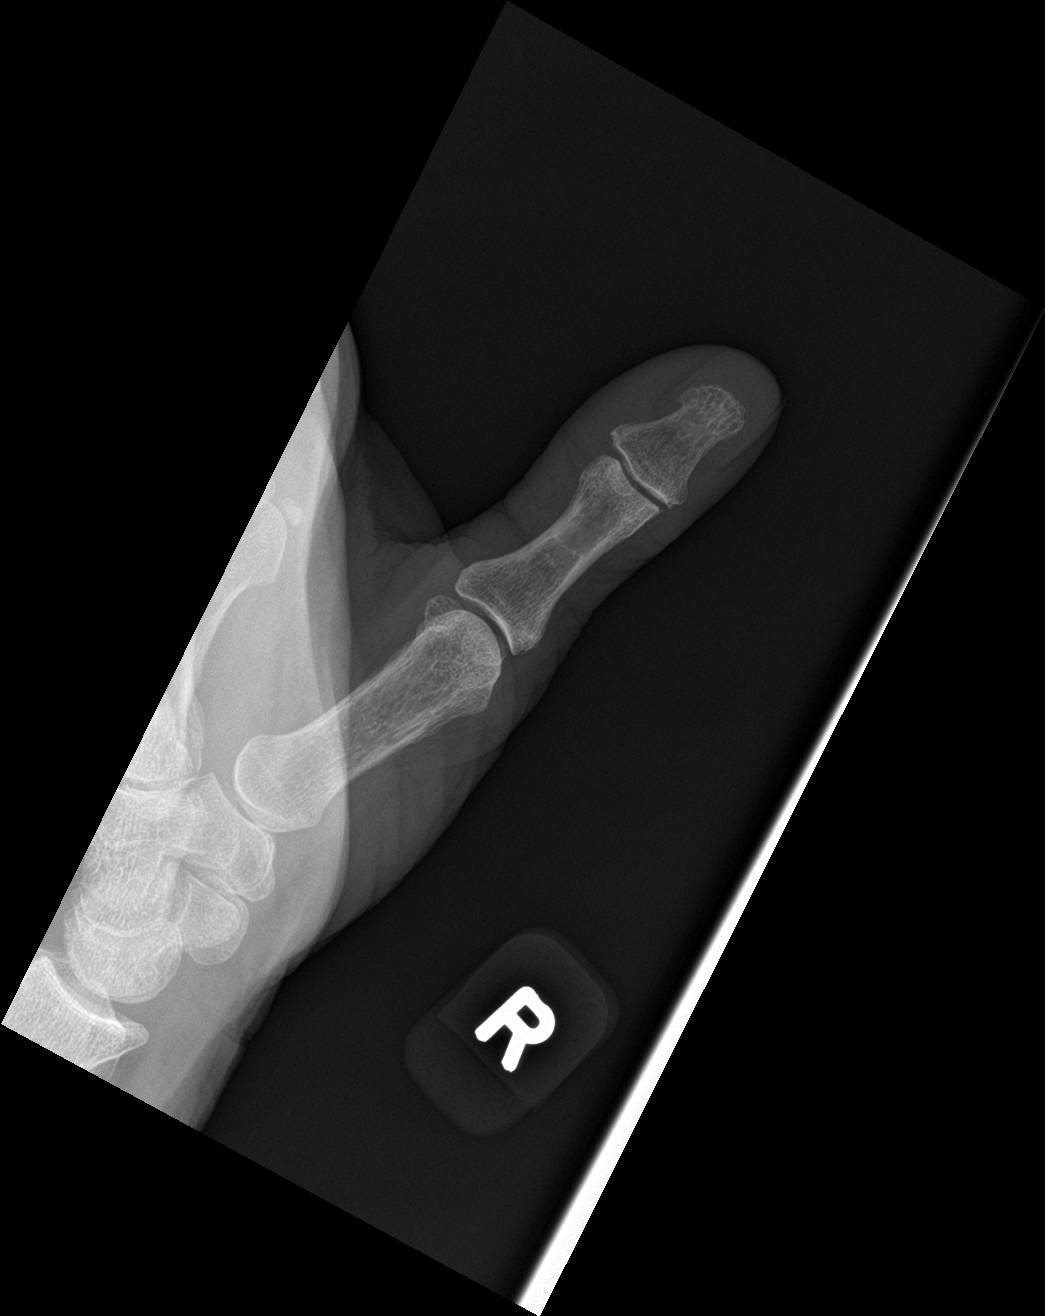

[finger obl]
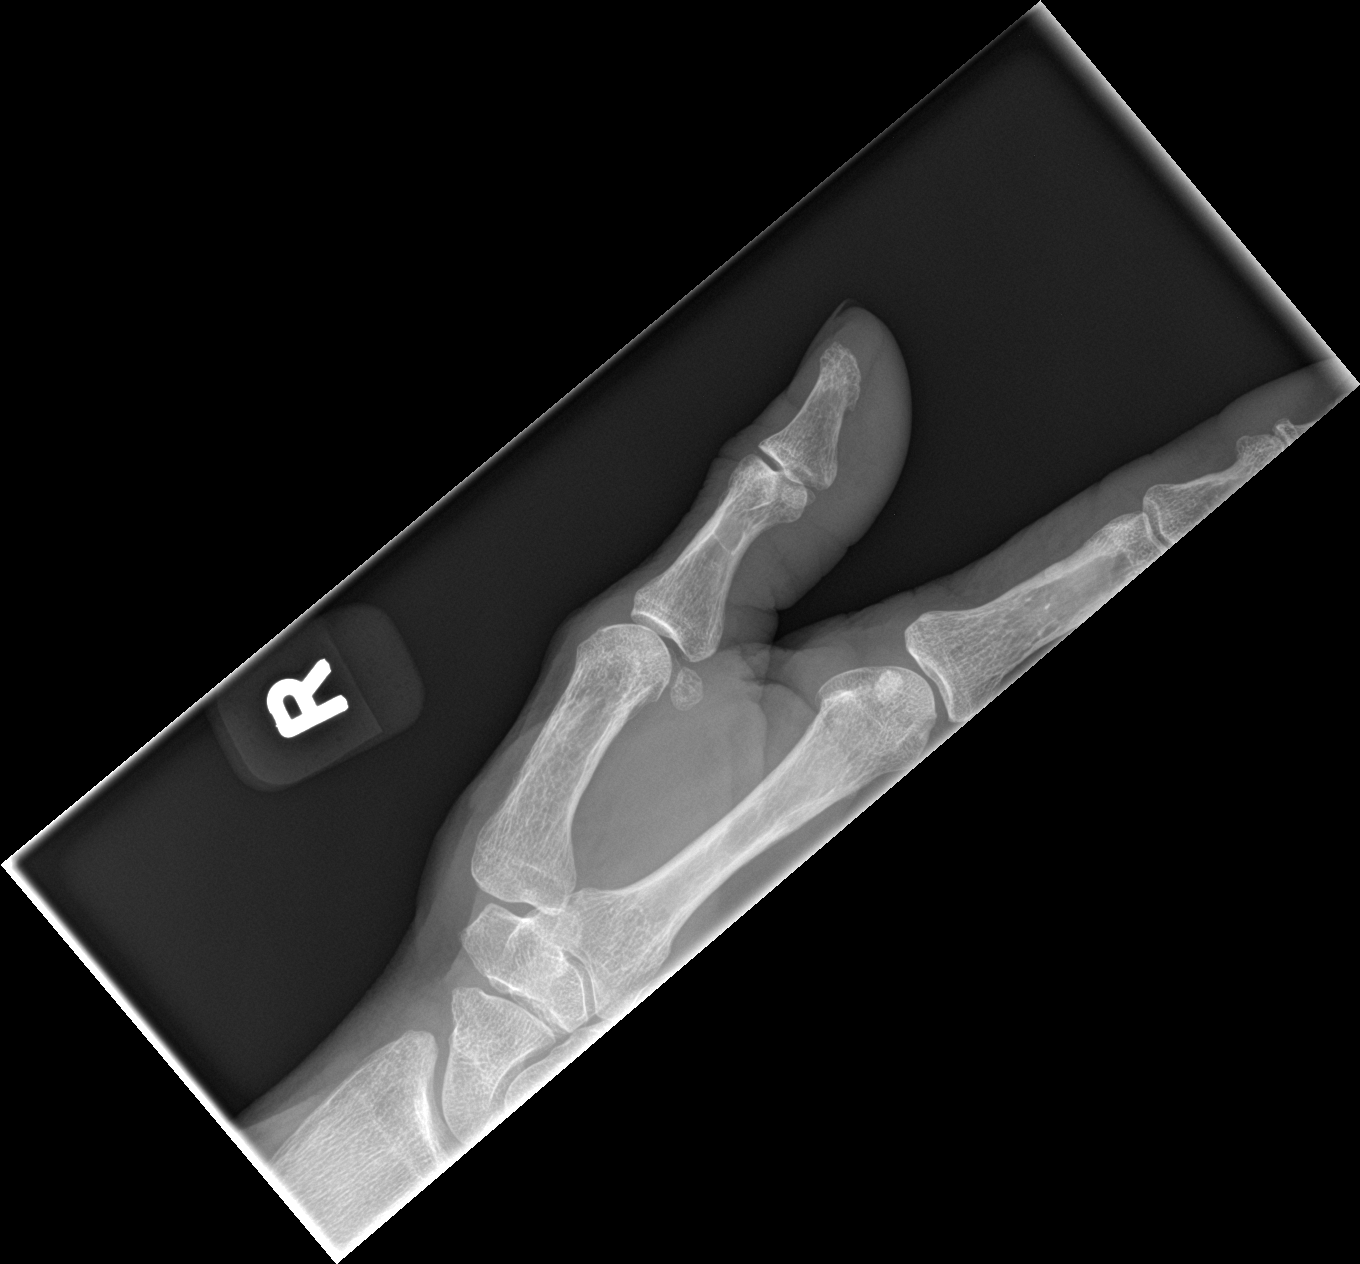

[finger lat]
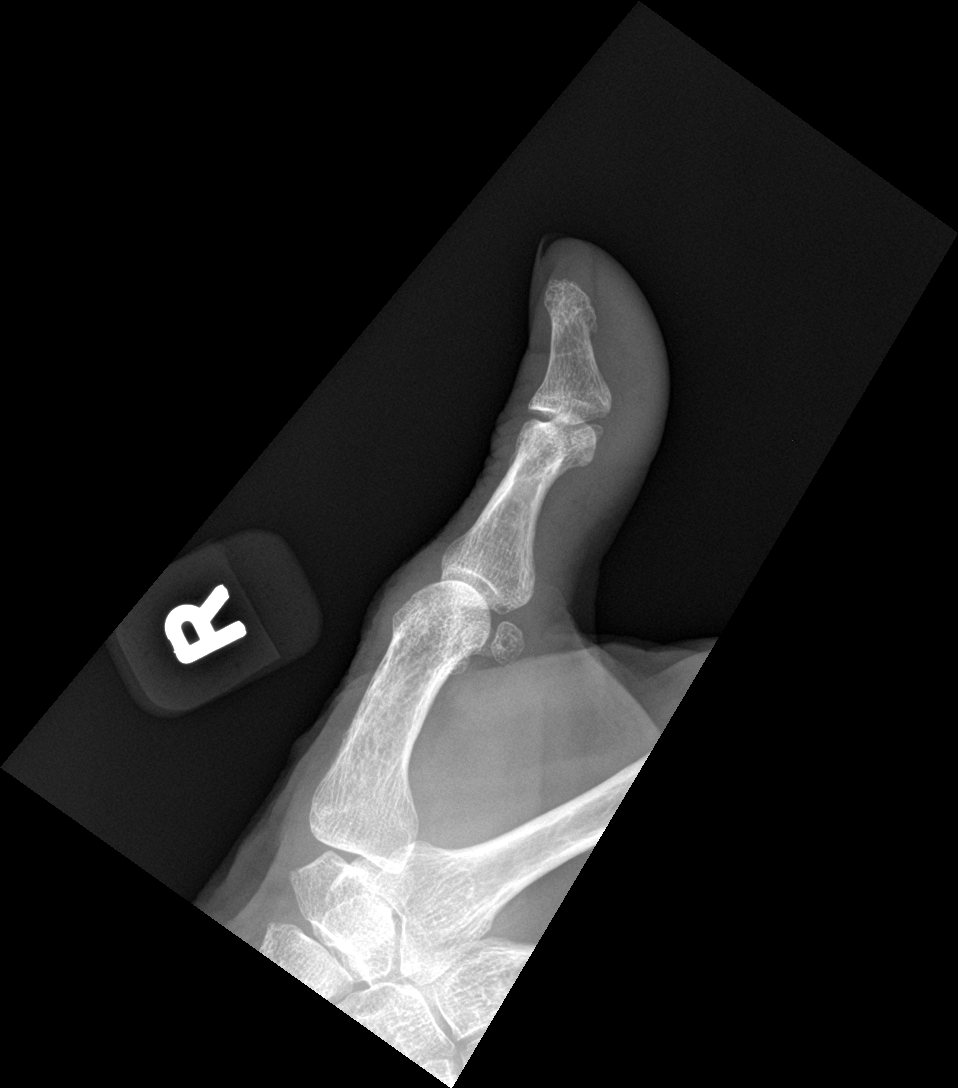

[3 of 3 positions shown; findings below may reference images not displayed]

FINDINGS: No fracture or dislocation. No suspicious focal osseous lesion. No
osseous erosions or periosteal reaction. Minimal interphalangeal
joint osteoarthritis in the right thumb. No radiopaque foreign body.
IMPRESSION: No acute osseous abnormality. Minimal interphalangeal joint right
thumb osteoarthritis.

## 2020-03-19 ENCOUNTER — Ambulatory Visit: Payer: Medicare Other | Admitting: Allergy and Immunology

## 2020-03-19 ENCOUNTER — Other Ambulatory Visit: Payer: Self-pay

## 2020-03-19 ENCOUNTER — Encounter: Payer: Self-pay | Admitting: Allergy and Immunology

## 2020-03-19 VITALS — BP 122/62 | HR 84 | Temp 98.0°F | Resp 16

## 2020-03-19 DIAGNOSIS — J3089 Other allergic rhinitis: Secondary | ICD-10-CM | POA: Diagnosis not present

## 2020-03-19 DIAGNOSIS — K219 Gastro-esophageal reflux disease without esophagitis: Secondary | ICD-10-CM | POA: Diagnosis not present

## 2020-03-19 NOTE — Patient Instructions (Signed)
  1.  Restart immunotherapy    2.  Use OTC Flonase -1 spray each nostril 1-7 times per week if needed  3.  Return to clinic in 12 months or earlier if problem  

## 2020-03-19 NOTE — Progress Notes (Signed)
[facility] [place]   Follow-up Note  Referring Provider: [name], MD Primary Provider: [name], MD Date of Office Visit: 03/19/2020  Subjective:   Anne Mills (DOB: 07-24-1946) is a 74 y.o. female who returns to the [facility] on 03/19/2020 in re-evaluation of the following:  HPI: Anne Mills presents to this clinic in reevaluation of her allergic rhinitis and reflux.  Her last contact with this clinic was via a E-med visit on 25 July 2019.  Because of the coronavirus pandemic she had discontinued her immunotherapy and she would like to restart this form of treatment.  In the past her immunotherapy has really resulted in very good control of her upper airway issue.  She still continues to use Flonase as needed and remains away from the use of antihistamines because of her dry eye syndrome.  She has not required a systemic steroid or an antibiotic to treat an airway issue since being seen in this clinic.  Her reflux is under very good control with apple cider vinegar administration.  She has received 2 Pfizer Covid vaccinations.  Allergies as of 03/19/2020      Reactions   Gluten Meal Cough   Biaxin [clarithromycin] Nausea And Vomiting      Medication List    alendronate 70 MG tablet Commonly known as: FOSAMAX Take 1 tablet (70 mg total) by mouth every 7 (seven) days. Take with a full glass of water on an empty stomach.   ALPRAZolam 0.5 MG tablet Commonly known as: XANAX TAKE 1 TABLET BY MOUTH AS A ONE TIME DOSE HALF AN HOUR PRIOR TO MRI   ASPERCREME EX Apply 1 application topically 2 (two) times daily as needed (for back pain).   CALCIUM 600 + D PO Take 1 tablet by mouth daily.   CALCIUM PO Take 1,200 mg by mouth.   Camphor-Menthol-Methyl Sal 1.2-5.7-6.3 % Ptch Apply topically.   Cholecalciferol 50 MCG (2000 UT) Tabs Take 1 tablet by mouth daily.   cyclobenzaprine 5 MG tablet Commonly  known as: FLEXERIL Take 1 tablet (5 mg total) by mouth at bedtime.   EPINEPHrine 0.3 mg/0.3 mL Soaj injection Commonly known as: EpiPen 2-Pak Use as directed for severe allergic reaction   EVENING PRIMROSE OIL PO Take 1,300 mg by mouth.   FISH OIL PO Take by mouth.   gabapentin 300 MG capsule Commonly known as: NEURONTIN Take 300 mg by mouth 3 (three) times daily.   GELATIN PO Take 1,300 mg by mouth.   ibuprofen 600 MG tablet Commonly known as: ADVIL Take 1 tablet (600 mg total) by mouth every 8 (eight) hours as needed.   Magnesium 500 MG Caps Take by mouth.   NAPROXEN PO Take by mouth as needed.   NON FORMULARY cbd oil / cbd cream   Osteo Bi-Flex Triple Strength Tabs Take 2 tablets by mouth daily.   OVER THE COUNTER MEDICATION Transitions w/Hesperidin vegetarian taking   OVER THE COUNTER MEDICATION Wal-Finate Dw/Pseudoephedrine taking   PROBIOTIC PO Take by mouth. 10 Billion Active Cultures   PSEUDOEPHED-CHLORPHEN-DM-GG PO Take by mouth.   Stress Tab NF Tabs Take 1 tablet by mouth daily.   VITAMIN B-COMPLEX PO Take by mouth. Liquid       Past Medical History:  Diagnosis Date  . Allergy   . Asthma    no inhaler  . Back pain, chronic   . Chest pain    under left breast-  2-3 x per week: h/o breast surgery  . GERD (gastroesophageal reflux disease)   . Headache(784.0)   . Heart murmur    "not serious" per pt-no echo  . Scoliosis   . Seasonal allergies   . Shortness of breath    per pt due to allergies    Past Surgical History:  Procedure Laterality Date  . ABDOMINOPLASTY    . BREAST SURGERY     lt cyst  . COLONOSCOPY    . DILATION AND CURETTAGE OF UTERUS N/A 03/03/2013   Procedure: DILATATION AND CURETTAGE;  Surgeon: Lahoma Crocker, MD;  Location: [facility];  Service: Gynecology;  Laterality: N/A;  . EXAMINATION UNDER ANESTHESIA N/A 03/03/2013   Procedure: EXAM UNDER ANESTHESIA with ultrasound;  Surgeon: Lahoma Crocker, MD;  Location:  [facility];  Service: Gynecology;  Laterality: N/A;  . TRIGGER FINGER RELEASE Left 06/21/2014   Procedure: LEFT THUMB TRIGGER RELEASE;  Surgeon: Tennis Must, MD;  Location: [facility];  Service: Orthopedics;  Laterality: Left;    Review of systems negative except as noted in HPI / PMHx or noted below:  Review of Systems  Constitutional: Negative.   HENT: Negative.   Eyes: Negative.   Respiratory: Negative.   Cardiovascular: Negative.   Gastrointestinal: Negative.   Genitourinary: Negative.   Musculoskeletal: Negative.   Skin: Negative.   Neurological: Negative.   Endo/Heme/Allergies: Negative.   Psychiatric/Behavioral: Negative.      Objective:   Vitals:   03/19/20 1123  BP: 122/62  Pulse: 84  Resp: 16  Temp: 98 F (36.7 C)  SpO2: 99%          Physical Exam Constitutional:      Appearance: She is not diaphoretic.  HENT:     Head: Normocephalic.     Right Ear: Tympanic membrane, ear canal and external ear normal.     Left Ear: Tympanic membrane, ear canal and external ear normal.     Nose: Nose normal. No mucosal edema or rhinorrhea.     Mouth/Throat:     Pharynx: Uvula midline. No oropharyngeal exudate.  Eyes:     Conjunctiva/sclera: Conjunctivae normal.  Neck:     Thyroid: No thyromegaly.     Trachea: Trachea normal. No tracheal tenderness or tracheal deviation.  Cardiovascular:     Rate and Rhythm: Normal rate and regular rhythm.     Heart sounds: Normal heart sounds, S1 normal and S2 normal. No murmur.  Pulmonary:     Effort: No respiratory distress.     Breath sounds: Normal breath sounds. No stridor. No wheezing or rales.  Lymphadenopathy:     Head:     Right side of head: No tonsillar adenopathy.     Left side of head: No tonsillar adenopathy.     Cervical: No cervical adenopathy.  Skin:    Findings: No erythema or rash.     Nails: There is no clubbing.  Neurological:     Mental Status: She is alert.     Diagnostics:  none  Assessment and Plan:   1. Perennial allergic rhinitis   2. Gastroesophageal reflux disease, unspecified whether esophagitis present     1.  Restart immunotherapy    2.  Use OTC Flonase -1 spray each nostril 1-7 times per week if needed  3.  Return to clinic in 12 months or earlier if problem  Mardene Celeste would like to restart her immunotherapy and we will get that arranged sometime over the course of the next week.  In the past her immunotherapy has helped her upper airway issue significantly.  She can use Flonase as needed.  There appears to be very good control of her reflux disease while using apple cider vinegar and this does not require any further evaluation or treatment at this point.  I will see her back in this clinic in 1 year or earlier if there is a problem.  Allena Katz, MD Allergy / Immunology [facility]

## 2020-03-20 ENCOUNTER — Encounter: Payer: Self-pay | Admitting: Allergy and Immunology

## 2020-03-21 DIAGNOSIS — J302 Other seasonal allergic rhinitis: Secondary | ICD-10-CM | POA: Diagnosis not present

## 2020-03-21 NOTE — Progress Notes (Signed)
EXP 03/21/21

## 2020-03-22 DIAGNOSIS — M542 Cervicalgia: Secondary | ICD-10-CM | POA: Diagnosis not present

## 2020-03-22 DIAGNOSIS — Z6822 Body mass index (BMI) 22.0-22.9, adult: Secondary | ICD-10-CM | POA: Diagnosis not present

## 2020-03-22 DIAGNOSIS — M4722 Other spondylosis with radiculopathy, cervical region: Secondary | ICD-10-CM | POA: Diagnosis not present

## 2020-03-22 DIAGNOSIS — M5412 Radiculopathy, cervical region: Secondary | ICD-10-CM | POA: Diagnosis not present

## 2020-04-11 DIAGNOSIS — M47892 Other spondylosis, cervical region: Secondary | ICD-10-CM | POA: Diagnosis not present

## 2020-04-11 DIAGNOSIS — M542 Cervicalgia: Secondary | ICD-10-CM | POA: Diagnosis not present

## 2020-04-11 DIAGNOSIS — M5412 Radiculopathy, cervical region: Secondary | ICD-10-CM | POA: Diagnosis not present

## 2020-04-17 DIAGNOSIS — M47892 Other spondylosis, cervical region: Secondary | ICD-10-CM | POA: Diagnosis not present

## 2020-04-17 DIAGNOSIS — M47812 Spondylosis without myelopathy or radiculopathy, cervical region: Secondary | ICD-10-CM | POA: Diagnosis not present

## 2020-04-18 ENCOUNTER — Ambulatory Visit: Payer: Medicare Other

## 2020-04-24 ENCOUNTER — Ambulatory Visit: Payer: Medicare Other

## 2020-04-25 ENCOUNTER — Other Ambulatory Visit: Payer: Self-pay | Admitting: Emergency Medicine

## 2020-04-25 ENCOUNTER — Other Ambulatory Visit: Payer: Self-pay | Admitting: Family Medicine

## 2020-04-25 NOTE — Telephone Encounter (Signed)
Pt is needing refill on   What is the name of the medication? cyclobenzaprine (FLEXERIL) 5 MG tablet [id number]    Have you contacted your pharmacy to request a refill? y  Which pharmacy would you like this sent to? [place], [place] - [id number]  [place] [address]  Phone:  [phone] Fax:  [phone]    Patient notified that their request is being sent to the clinical staff for review and that they should receive a call once it is complete. If they do not receive a call within 72 hours they can check with their pharmacy or our office.

## 2020-04-26 MED ORDER — CYCLOBENZAPRINE HCL 5 MG PO TABS: 5.0000 mg | ORAL_TABLET | Freq: Every day | ORAL | 1 refills | Status: DC

## 2020-04-26 NOTE — Telephone Encounter (Signed)
Patient is requesting a refill of the following medications: Requested Prescriptions   Pending Prescriptions Disp Refills  . cyclobenzaprine (FLEXERIL) 5 MG tablet 90 tablet 1    Sig: Take 1 tablet (5 mg total) by mouth at bedtime.    Date of patient request: 04/25/20 Last office visit: 03/12/20 Date of last refill: 06/01/19 (filled by L. Courm) Last refill amount: 90 Follow up time period per chart: 06/13/20

## 2020-05-02 ENCOUNTER — Other Ambulatory Visit: Payer: Self-pay

## 2020-05-02 ENCOUNTER — Telehealth: Payer: Self-pay

## 2020-05-02 NOTE — Telephone Encounter (Signed)
Pt received seconf mammo on 02/08/20 and follow up with Autumn Messing of [facility] Surgery on 02/28/20. Requesting records

## 2020-05-02 NOTE — Telephone Encounter (Signed)
Pt was called to follow up with appt for 2nd mammo. Received notice from [facility] that repeat is needed. Lm for pt to call bk.

## 2020-05-03 DIAGNOSIS — M47812 Spondylosis without myelopathy or radiculopathy, cervical region: Secondary | ICD-10-CM | POA: Diagnosis not present

## 2020-05-03 DIAGNOSIS — M5412 Radiculopathy, cervical region: Secondary | ICD-10-CM | POA: Diagnosis not present

## 2020-05-03 DIAGNOSIS — Z6822 Body mass index (BMI) 22.0-22.9, adult: Secondary | ICD-10-CM | POA: Diagnosis not present

## 2020-05-03 DIAGNOSIS — M47892 Other spondylosis, cervical region: Secondary | ICD-10-CM | POA: Diagnosis not present

## 2020-05-22 ENCOUNTER — Ambulatory Visit (INDEPENDENT_AMBULATORY_CARE_PROVIDER_SITE_OTHER): Payer: Medicare Other | Admitting: *Deleted

## 2020-05-22 DIAGNOSIS — J309 Allergic rhinitis, unspecified: Secondary | ICD-10-CM

## 2020-05-22 NOTE — Progress Notes (Signed)
Immunotherapy   Patient Details  Name: Anne Mills MRN: [id number] Date of Birth: 05/07/1946  05/22/2020  [name] started injections for  mold blue vial Following schedule: B  Frequency:2 times per week Epi-Pen:Prescription for Epi-Pen given Consent signed and patient instructions given.   Robie Ridge 05/22/2020, 2:12 PM

## 2020-05-30 ENCOUNTER — Ambulatory Visit (INDEPENDENT_AMBULATORY_CARE_PROVIDER_SITE_OTHER): Payer: Medicare Other

## 2020-05-30 DIAGNOSIS — J309 Allergic rhinitis, unspecified: Secondary | ICD-10-CM | POA: Diagnosis not present

## 2020-06-03 DIAGNOSIS — M47892 Other spondylosis, cervical region: Secondary | ICD-10-CM | POA: Diagnosis not present

## 2020-06-03 DIAGNOSIS — Z6822 Body mass index (BMI) 22.0-22.9, adult: Secondary | ICD-10-CM | POA: Diagnosis not present

## 2020-06-03 DIAGNOSIS — M542 Cervicalgia: Secondary | ICD-10-CM | POA: Diagnosis not present

## 2020-06-03 DIAGNOSIS — S46091D Other injury of muscle(s) and tendon(s) of the rotator cuff of right shoulder, subsequent encounter: Secondary | ICD-10-CM | POA: Diagnosis not present

## 2020-06-06 ENCOUNTER — Ambulatory Visit (INDEPENDENT_AMBULATORY_CARE_PROVIDER_SITE_OTHER): Payer: Medicare Other

## 2020-06-06 DIAGNOSIS — J309 Allergic rhinitis, unspecified: Secondary | ICD-10-CM | POA: Diagnosis not present

## 2020-06-11 ENCOUNTER — Ambulatory Visit (INDEPENDENT_AMBULATORY_CARE_PROVIDER_SITE_OTHER): Payer: Medicare Other

## 2020-06-11 DIAGNOSIS — M85851 Other specified disorders of bone density and structure, right thigh: Secondary | ICD-10-CM | POA: Diagnosis not present

## 2020-06-11 DIAGNOSIS — M85852 Other specified disorders of bone density and structure, left thigh: Secondary | ICD-10-CM | POA: Diagnosis not present

## 2020-06-11 DIAGNOSIS — J309 Allergic rhinitis, unspecified: Secondary | ICD-10-CM | POA: Diagnosis not present

## 2020-06-11 DIAGNOSIS — M810 Age-related osteoporosis without current pathological fracture: Secondary | ICD-10-CM | POA: Diagnosis not present

## 2020-06-11 LAB — HM DEXA SCAN

## 2020-06-13 ENCOUNTER — Encounter: Payer: Self-pay | Admitting: Family Medicine

## 2020-06-13 ENCOUNTER — Other Ambulatory Visit: Payer: Self-pay

## 2020-06-13 ENCOUNTER — Ambulatory Visit (INDEPENDENT_AMBULATORY_CARE_PROVIDER_SITE_OTHER): Payer: Medicare Other | Admitting: Family Medicine

## 2020-06-13 VITALS — BP 126/71 | HR 88 | Temp 97.2°F | Ht 61.0 in | Wt 113.6 lb

## 2020-06-13 DIAGNOSIS — R079 Chest pain, unspecified: Secondary | ICD-10-CM | POA: Diagnosis not present

## 2020-06-13 DIAGNOSIS — M810 Age-related osteoporosis without current pathological fracture: Secondary | ICD-10-CM

## 2020-06-13 NOTE — Patient Instructions (Signed)
° ° ° °  If you have lab work done today you will be contacted with your lab results within the next 2 weeks.  If you have not heard from us then please contact us. The fastest way to get your results is to register for My Chart. ° ° °IF you received an x-ray today, you will receive an invoice from [facility]. Please contact  Radiology at [phone] with questions or concerns regarding your invoice.  ° °IF you received labwork today, you will receive an invoice from [facility]. Please contact [facility] at [phone] with questions or concerns regarding your invoice.  ° °Our billing staff will not be able to assist you with questions regarding bills from these companies. ° °You will be contacted with the lab results as soon as they are available. The fastest way to get your results is to activate your My Chart account. Instructions are located on the last page of this paperwork. If you have not heard from us regarding the results in 2 weeks, please contact this office. °  ° ° ° °

## 2020-06-13 NOTE — Progress Notes (Signed)
7/8/20211:38 PM  Anne Mills 02/05/1946, 74 y.o., female [id number]  Chief Complaint  Patient presents with  . Follow-up    alendronate follow up, took 1 pill, read side effacts has not taken anymore of the med  . Chest Pain    mainly on the left side, feels like pins and very sharp. happens at least 2x a wk    HPI:   Patient is a 74 y.o. female with past medical history significant for osteoporosis who presents today for several concerns  Last OV April 2021 - started fosamax Did not take due to concerns for possible side effects, as she has severe gerd She continues to take calcium and vit d supplement She continues to do weight bearing exercise  She has been changing let sided sharp stabbing chest pain for past several years She gets this pain about 2 x week, lasts 30-60 minutes She has not noticed it when lying down She denies it getting worse with deep breathing or movement or exercise, she does low impact aerobic 3 x week Feels like bruised rib She denies any associated palpitations, SOB, nausea, diaphoresis Has h/o asthma and reflux Non smoker  Depression screen [facility] 2/9 03/12/2020 01/10/2020 06/01/2019  Decreased Interest 0 0 0  Down, Depressed, Hopeless 0 0 0  PHQ - 2 Score 0 0 0    Fall Risk  06/13/2020 03/12/2020 01/10/2020 06/01/2019 02/18/2019  Falls in the past year? 0 0 0 0 0  Number falls in past yr: 0 0 0 - 0  Injury with Fall? 0 0 0 0 0  Follow up - Falls evaluation completed Falls evaluation completed;Education provided - Falls evaluation completed     Allergies  Allergen Reactions  . Gluten Meal Cough  . Biaxin [Clarithromycin] Nausea And Vomiting    Prior to Admission medications   Medication Sig Start Date End Date Taking? Authorizing Provider  ALPRAZolam (XANAX) 0.5 MG tablet TAKE 1 TABLET BY MOUTH AS A ONE TIME DOSE HALF AN HOUR PRIOR TO MRI 03/07/20  Yes [provider]  B Complex Vitamins (VITAMIN B-COMPLEX PO) Take by mouth. Liquid   Yes  [provider]  Calcium Carbonate-Vitamin D (CALCIUM 600 + D PO) Take 1 tablet by mouth daily.   Yes [provider]  CALCIUM PO Take 1,200 mg by mouth.   Yes [provider]  Camphor-Menthol-Methyl Sal 1.2-5.7-6.3 % PTCH Apply topically.   Yes [provider]  Cholecalciferol 2000 UNITS TABS Take 1 tablet by mouth daily.   Yes [provider]  cyclobenzaprine (FLEXERIL) 5 MG tablet Take 1 tablet (5 mg total) by mouth at bedtime. 04/26/20  Yes [name], MD  EPINEPHrine (EPIPEN 2-PAK) 0.3 mg/0.3 mL IJ SOAJ injection Use as directed for severe allergic reaction 07/07/18  Yes Kozlow, [name], MD  EVENING PRIMROSE OIL PO Take 1,300 mg by mouth.   Yes [provider]  gabapentin (NEURONTIN) 300 MG capsule Take 300 mg by mouth 3 (three) times daily.   Yes [provider]  GELATIN PO Take 1,300 mg by mouth.   Yes [provider]  ibuprofen (ADVIL,MOTRIN) 600 MG tablet Take 1 tablet (600 mg total) by mouth every 8 (eight) hours as needed. 11/02/18  Yes [name], MD  Magnesium 500 MG CAPS Take by mouth.   Yes [provider]  Misc Natural Products (OSTEO BI-FLEX TRIPLE STRENGTH) TABS Take 2 tablets by mouth daily.   Yes [provider]  Multiple  Vitamins-Minerals (STRESS TAB NF) TABS Take 1 tablet by mouth daily.   Yes [provider]  NAPROXEN PO Take by mouth as needed.   Yes [provider]  NON FORMULARY cbd oil / cbd cream   Yes [provider]  Omega-3 Fatty Acids (FISH OIL PO) Take by mouth.   Yes [provider]  OVER THE COUNTER MEDICATION Transitions w/Hesperidin vegetarian taking   Yes [provider]  OVER THE COUNTER MEDICATION Wal-Finate Dw/Pseudoephedrine taking   Yes [provider]  Probiotic Product (PROBIOTIC PO) Take by mouth. 10 Billion Active Cultures   Yes [provider]  PSEUDOEPHED-CHLORPHEN-DM-GG PO Take by mouth.    Yes [provider]  Trolamine Salicylate (ASPERCREME EX) Apply 1 application topically 2 (two) times daily as needed (for back pain).   Yes [provider]  alendronate (FOSAMAX) 70 MG tablet Take 1 tablet (70 mg total) by mouth every 7 (seven) days. Take with a full glass of water on an empty stomach. Patient not taking: Reported on 06/13/2020 03/12/20   [name], MD    Past Medical History:  Diagnosis Date  . Allergy   . Asthma    no inhaler  . Back pain, chronic   . Chest pain    under left breast- 2-3 x per week: h/o breast surgery  . GERD (gastroesophageal reflux disease)   . Headache(784.0)   . Heart murmur    "not serious" per pt-no echo  . Scoliosis   . Seasonal allergies   . Shortness of breath    per pt due to allergies    Past Surgical History:  Procedure Laterality Date  . ABDOMINOPLASTY    . BREAST SURGERY     lt cyst  . COLONOSCOPY    . DILATION AND CURETTAGE OF UTERUS N/A 03/03/2013   Procedure: DILATATION AND CURETTAGE;  Surgeon: Lahoma Crocker, MD;  Location: [facility];  Service: Gynecology;  Laterality: N/A;  . EXAMINATION UNDER ANESTHESIA N/A 03/03/2013   Procedure: EXAM UNDER ANESTHESIA with ultrasound;  Surgeon: Lahoma Crocker, MD;  Location: [facility];  Service: Gynecology;  Laterality: N/A;  . TRIGGER FINGER RELEASE Left 06/21/2014   Procedure: LEFT THUMB TRIGGER RELEASE;  Surgeon: Tennis Must, MD;  Location: [facility];  Service: Orthopedics;  Laterality: Left;    Social History   Tobacco Use  . Smoking status: Never Smoker  . Smokeless tobacco: Never Used  Substance Use Topics  . Alcohol use: No    Family History  Problem Relation Age of Onset  . Dementia Mother   . Hypertension Mother   . Cancer Mother 60       Breast cancer  . Depression Mother   . Cancer Sister   . Diabetes Sister   . Heart disease Sister 73       AMI as cause of death  . Hyperlipidemia Sister   . Hypertension Sister   .  Mental retardation Sister   . Cancer Father        prostate cancer  . Heart disease Sister 61       AMI  . Diabetes Sister     ROS Per hpi  OBJECTIVE:  Today's Vitals   06/13/20 1329  BP: 126/71  Pulse: 88  Temp: (!) 97.2 F (36.2 C)  SpO2: 100%  Weight: 113 lb 9.6 oz (51.5 kg)  Height: 5\' 1"  (1.549 m)   Body mass index is 21.46 kg/m.   Physical Exam Vitals  and nursing note reviewed.  Constitutional:      Appearance: She is well-developed.  HENT:     Head: Normocephalic and atraumatic.     Mouth/Throat:     Pharynx: No oropharyngeal exudate.  Eyes:     General: No scleral icterus.    Conjunctiva/sclera: Conjunctivae normal.     Pupils: Pupils are equal, round, and reactive to light.  Cardiovascular:     Rate and Rhythm: Normal rate and regular rhythm.     Heart sounds: Normal heart sounds. No murmur heard.  No friction rub. No gallop.   Pulmonary:     Effort: Pulmonary effort is normal.     Breath sounds: Normal breath sounds. No wheezing, rhonchi or rales.  Chest:     Chest wall: Tenderness (left upper chest wall) present.  Musculoskeletal:     Cervical back: Neck supple.  Skin:    General: Skin is warm and dry.  Neurological:     Mental Status: She is alert and oriented to person, place, and time.     No results found for this or any previous visit (from the past 24 hour(s)).  No results found.  My interpretation of EKG:  NSR HR 82, normal intervals, no st changes  ASSESSMENT and PLAN  1. Chest pain, unspecified type No cardiac. RTC precautions given - EKG 12-Lead - Comprehensive metabolic panel - Lipid panel - TSH - CBC  2. Age-related osteoporosis without current pathological fracture Patient declines further treatment at this time, she will cont with ca/d and weight bearing exercise.   No follow-ups on file.    [name], MD Primary Care at [facility] [place], [place] [address] Ph.  [phone] Fax  [phone]

## 2020-06-14 LAB — COMPREHENSIVE METABOLIC PANEL
ALT: 23 IU/L (ref 0–32)
AST: 25 IU/L (ref 0–40)
Albumin/Globulin Ratio: 1.8 (ref 1.2–2.2)
Albumin: 4.4 g/dL (ref 3.7–4.7)
Alkaline Phosphatase: 125 IU/L — ABNORMAL HIGH (ref 48–121)
BUN/Creatinine Ratio: 25 (ref 12–28)
BUN: 16 mg/dL (ref 8–27)
Bilirubin Total: 0.6 mg/dL (ref 0.0–1.2)
CO2: 29 mmol/L (ref 20–29)
Calcium: 9.7 mg/dL (ref 8.7–10.3)
Chloride: 101 mmol/L (ref 96–106)
Creatinine, Ser: 0.63 mg/dL (ref 0.57–1.00)
GFR calc Af Amer: 103 mL/min/{1.73_m2} (ref >59)
GFR calc non Af Amer: 89 mL/min/{1.73_m2} (ref >59)
Globulin, Total: 2.5 g/dL (ref 1.5–4.5)
Glucose: 82 mg/dL (ref 65–99)
Potassium: 4.8 mmol/L (ref 3.5–5.2)
Sodium: 140 mmol/L (ref 134–144)
Total Protein: 6.9 g/dL (ref 6.0–8.5)

## 2020-06-14 LAB — CBC
Hematocrit: 43.1 % (ref 34.0–46.6)
Hemoglobin: 14.4 g/dL (ref 11.1–15.9)
MCH: 29.9 pg (ref 26.6–33.0)
MCHC: 33.4 g/dL (ref 31.5–35.7)
MCV: 90 fL (ref 79–97)
Platelets: 289 10*3/uL (ref 150–450)
RBC: 4.81 x10E6/uL (ref 3.77–5.28)
RDW: 12.8 % (ref 11.7–15.4)
WBC: 6.5 10*3/uL (ref 3.4–10.8)

## 2020-06-14 LAB — LIPID PANEL
Chol/HDL Ratio: 3.2 ratio (ref 0.0–4.4)
Cholesterol, Total: 202 mg/dL — ABNORMAL HIGH (ref 100–199)
HDL: 64 mg/dL (ref >39)
LDL Chol Calc (NIH): 110 mg/dL — ABNORMAL HIGH (ref 0–99)
Triglycerides: 164 mg/dL — ABNORMAL HIGH (ref 0–149)
VLDL Cholesterol Cal: 28 mg/dL (ref 5–40)

## 2020-06-14 LAB — TSH: TSH: 0.793 u[IU]/mL (ref 0.450–4.500)

## 2020-06-20 ENCOUNTER — Ambulatory Visit (INDEPENDENT_AMBULATORY_CARE_PROVIDER_SITE_OTHER): Payer: Medicare Other

## 2020-06-20 DIAGNOSIS — J309 Allergic rhinitis, unspecified: Secondary | ICD-10-CM

## 2020-06-27 ENCOUNTER — Ambulatory Visit (INDEPENDENT_AMBULATORY_CARE_PROVIDER_SITE_OTHER): Payer: Medicare Other

## 2020-06-27 DIAGNOSIS — J309 Allergic rhinitis, unspecified: Secondary | ICD-10-CM | POA: Diagnosis not present

## 2020-07-18 ENCOUNTER — Ambulatory Visit (INDEPENDENT_AMBULATORY_CARE_PROVIDER_SITE_OTHER): Payer: Medicare Other

## 2020-07-18 DIAGNOSIS — J309 Allergic rhinitis, unspecified: Secondary | ICD-10-CM | POA: Diagnosis not present

## 2020-07-18 DIAGNOSIS — M47892 Other spondylosis, cervical region: Secondary | ICD-10-CM | POA: Diagnosis not present

## 2020-07-18 DIAGNOSIS — M542 Cervicalgia: Secondary | ICD-10-CM | POA: Diagnosis not present

## 2020-07-22 ENCOUNTER — Ambulatory Visit (INDEPENDENT_AMBULATORY_CARE_PROVIDER_SITE_OTHER): Payer: Medicare Other

## 2020-07-22 DIAGNOSIS — J309 Allergic rhinitis, unspecified: Secondary | ICD-10-CM | POA: Diagnosis not present

## 2020-07-24 DIAGNOSIS — Z1211 Encounter for screening for malignant neoplasm of colon: Secondary | ICD-10-CM | POA: Diagnosis not present

## 2020-07-24 DIAGNOSIS — K621 Rectal polyp: Secondary | ICD-10-CM | POA: Diagnosis not present

## 2020-07-24 DIAGNOSIS — D123 Benign neoplasm of transverse colon: Secondary | ICD-10-CM | POA: Diagnosis not present

## 2020-07-26 DIAGNOSIS — D123 Benign neoplasm of transverse colon: Secondary | ICD-10-CM | POA: Diagnosis not present

## 2020-07-26 DIAGNOSIS — K621 Rectal polyp: Secondary | ICD-10-CM | POA: Diagnosis not present

## 2020-07-30 ENCOUNTER — Ambulatory Visit (INDEPENDENT_AMBULATORY_CARE_PROVIDER_SITE_OTHER): Payer: Medicare Other

## 2020-07-30 DIAGNOSIS — J309 Allergic rhinitis, unspecified: Secondary | ICD-10-CM | POA: Diagnosis not present

## 2020-08-14 DIAGNOSIS — M5032 Other cervical disc degeneration, mid-cervical region, unspecified level: Secondary | ICD-10-CM | POA: Diagnosis not present

## 2020-08-16 ENCOUNTER — Ambulatory Visit (INDEPENDENT_AMBULATORY_CARE_PROVIDER_SITE_OTHER): Payer: Medicare Other

## 2020-08-16 DIAGNOSIS — J309 Allergic rhinitis, unspecified: Secondary | ICD-10-CM | POA: Diagnosis not present

## 2020-08-19 DIAGNOSIS — Z01818 Encounter for other preprocedural examination: Secondary | ICD-10-CM | POA: Diagnosis not present

## 2020-08-19 DIAGNOSIS — H2511 Age-related nuclear cataract, right eye: Secondary | ICD-10-CM | POA: Diagnosis not present

## 2020-08-19 DIAGNOSIS — H2512 Age-related nuclear cataract, left eye: Secondary | ICD-10-CM | POA: Diagnosis not present

## 2020-08-23 ENCOUNTER — Ambulatory Visit (INDEPENDENT_AMBULATORY_CARE_PROVIDER_SITE_OTHER): Payer: Medicare Other | Admitting: *Deleted

## 2020-08-23 DIAGNOSIS — J309 Allergic rhinitis, unspecified: Secondary | ICD-10-CM | POA: Diagnosis not present

## 2020-09-02 ENCOUNTER — Ambulatory Visit (INDEPENDENT_AMBULATORY_CARE_PROVIDER_SITE_OTHER): Payer: Medicare Other | Admitting: *Deleted

## 2020-09-02 DIAGNOSIS — J309 Allergic rhinitis, unspecified: Secondary | ICD-10-CM | POA: Diagnosis not present

## 2020-09-03 ENCOUNTER — Other Ambulatory Visit: Payer: Self-pay

## 2020-09-03 ENCOUNTER — Encounter: Payer: Self-pay | Admitting: Family Medicine

## 2020-09-03 ENCOUNTER — Telehealth (INDEPENDENT_AMBULATORY_CARE_PROVIDER_SITE_OTHER): Payer: Medicare Other | Admitting: Family Medicine

## 2020-09-03 DIAGNOSIS — G8929 Other chronic pain: Secondary | ICD-10-CM

## 2020-09-03 DIAGNOSIS — M25511 Pain in right shoulder: Secondary | ICD-10-CM | POA: Diagnosis not present

## 2020-09-03 NOTE — Progress Notes (Signed)
Virtual Visit Note  I connected with patient on 09/03/20 at 514pm by phone and verified that I am speaking with the correct person using two identifiers. Anne Mills is currently located at home and patient is currently with them during visit. The provider, [name], MD is located in their office at time of visit.  I discussed the limitations, risks, security and privacy concerns of performing an evaluation and management service by telephone and the availability of in person appointments. I also discussed with the patient that there may be a patient responsible charge related to this service. The patient expressed understanding and agreed to proceed.   I provided 12 minutes of non-face-to-face time during this encounter.  Chief Complaint  Patient presents with  . Referral    going to spine and scoliosis doctor, injections in the left shoulder are not working. She is not able to raise her arn now due to that. Last inj was 3 wks ago . It also hurts when she holds her head down    HPI  RIGHT shoulder pain since 2018 Saw sports medicine in 2020 - thought to be shoulder impingment She had a routine appt at spine and scoliosis Told it was a pinched nerve, had epidural spinal injections and shoulder injections She reports that after her second injection she is now able to to raise and lower her arm She continues to have pain with moving her neck, specially when she looks down, such as when she does dishes She cont to use aspercream She has noticed that her right shoulder looks like it sticks out more She is requesting referral to shoulder specialist Has had neck MRI but not shoulder MRI Sometimes has numbness of right arm (between wrist and elbow) such as when she talks on her cell phone for too long of when she has her arm too straight  Per chart review - claims data shows OV on March 22 2020 with Charlott Holler, ortho provider Dx: cervical spondylosis with  radiculopathy, radiculopathy cervical region, cervicalgia  Allergies  Allergen Reactions  . Gluten Meal Cough  . Biaxin [Clarithromycin] Nausea And Vomiting    Prior to Admission medications   Medication Sig Start Date End Date Taking? Authorizing Provider  ALPRAZolam (XANAX) 0.5 MG tablet TAKE 1 TABLET BY MOUTH AS A ONE TIME DOSE HALF AN HOUR PRIOR TO MRI 03/07/20  Yes [provider]  B Complex Vitamins (VITAMIN B-COMPLEX PO) Take by mouth. Liquid   Yes [provider]  Calcium Carbonate-Vitamin D (CALCIUM 600 + D PO) Take 1 tablet by mouth daily.   Yes [provider]  CALCIUM PO Take 1,200 mg by mouth.   Yes [provider]  Camphor-Menthol-Methyl Sal 1.2-5.7-6.3 % PTCH Apply topically.   Yes [provider]  Cholecalciferol 2000 UNITS TABS Take 1 tablet by mouth daily.   Yes [provider]  cyclobenzaprine (FLEXERIL) 5 MG tablet Take 1 tablet (5 mg total) by mouth at bedtime. 04/26/20  Yes [name], MD  EPINEPHrine (EPIPEN 2-PAK) 0.3 mg/0.3 mL IJ SOAJ injection Use as directed for severe allergic reaction 07/07/18  Yes Kozlow, [name], MD  EVENING PRIMROSE OIL PO Take 1,300 mg by mouth.   Yes [provider]  gabapentin (NEURONTIN) 300 MG capsule Take 300 mg by mouth 3 (three) times daily.   Yes [provider]  GELATIN PO Take 1,300 mg by mouth.   Yes [provider]  ibuprofen (ADVIL,MOTRIN) 600 MG  tablet Take 1 tablet (600 mg total) by mouth every 8 (eight) hours as needed. 11/02/18  Yes [name], MD  Magnesium 500 MG CAPS Take by mouth.   Yes [provider]  Misc Natural Products (OSTEO BI-FLEX TRIPLE STRENGTH) TABS Take 2 tablets by mouth daily.   Yes [provider]  Multiple Vitamins-Minerals (STRESS TAB NF) TABS Take 1 tablet by mouth daily.   Yes [provider]  NAPROXEN PO Take by mouth as needed.   Yes [provider]  NON FORMULARY cbd oil / cbd  cream   Yes [provider]  Omega-3 Fatty Acids (FISH OIL PO) Take by mouth.   Yes [provider]  OVER THE COUNTER MEDICATION Transitions w/Hesperidin vegetarian taking   Yes [provider]  OVER THE COUNTER MEDICATION Wal-Finate Dw/Pseudoephedrine taking   Yes [provider]  Probiotic Product (PROBIOTIC PO) Take by mouth. 10 Billion Active Cultures   Yes [provider]  PSEUDOEPHED-CHLORPHEN-DM-GG PO Take by mouth.   Yes [provider]  Trolamine Salicylate (ASPERCREME EX) Apply 1 application topically 2 (two) times daily as needed (for back pain).   Yes [provider]    Past Medical History:  Diagnosis Date  . Allergy   . Asthma    no inhaler  . Back pain, chronic   . Chest pain    under left breast- 2-3 x per week: h/o breast surgery  . GERD (gastroesophageal reflux disease)   . Headache(784.0)   . Heart murmur    "not serious" per pt-no echo  . Scoliosis   . Seasonal allergies   . Shortness of breath    per pt due to allergies    Past Surgical History:  Procedure Laterality Date  . ABDOMINOPLASTY    . BREAST SURGERY     lt cyst  . COLONOSCOPY    . DILATION AND CURETTAGE OF UTERUS N/A 03/03/2013   Procedure: DILATATION AND CURETTAGE;  Surgeon: Lahoma Crocker, MD;  Location: [facility];  Service: Gynecology;  Laterality: N/A;  . EXAMINATION UNDER ANESTHESIA N/A 03/03/2013   Procedure: EXAM UNDER ANESTHESIA with ultrasound;  Surgeon: Lahoma Crocker, MD;  Location: [facility];  Service: Gynecology;  Laterality: N/A;  . TRIGGER FINGER RELEASE Left 06/21/2014   Procedure: LEFT THUMB TRIGGER RELEASE;  Surgeon: Tennis Must, MD;  Location: [facility];  Service: Orthopedics;  Laterality: Left;    Social History   Tobacco Use  . Smoking status: Never Smoker  . Smokeless tobacco: Never Used  Substance Use Topics  . Alcohol use: No    Family History  Problem Relation Age of Onset  .  Dementia Mother   . Hypertension Mother   . Cancer Mother 49       Breast cancer  . Depression Mother   . Cancer Sister   . Diabetes Sister   . Heart disease Sister 55       AMI as cause of death  . Hyperlipidemia Sister   . Hypertension Sister   . Mental retardation Sister   . Cancer Father        prostate cancer  . Heart disease Sister 27       AMI  . Diabetes Sister     ROS Per hpi  Objective  Vitals as reported by the patient: none   ASSESSMENT and PLAN  1. Chronic right shoulder pain - Ambulatory referral to Orthopedic Surgery  FOLLOW-UP: prn   The above assessment  and management plan was discussed with the patient. The patient verbalized understanding of and has agreed to the management plan. Patient is aware to call the clinic if symptoms persist or worsen. Patient is aware when to return to the clinic for a follow-up visit. Patient educated on when it is appropriate to go to the emergency department.     [name], MD Primary Care at [facility] [place], [place] [address] Ph.  [phone] Fax [phone]

## 2020-09-04 DIAGNOSIS — S46091D Other injury of muscle(s) and tendon(s) of the rotator cuff of right shoulder, subsequent encounter: Secondary | ICD-10-CM | POA: Diagnosis not present

## 2020-09-04 DIAGNOSIS — M47892 Other spondylosis, cervical region: Secondary | ICD-10-CM | POA: Diagnosis not present

## 2020-09-09 ENCOUNTER — Ambulatory Visit (INDEPENDENT_AMBULATORY_CARE_PROVIDER_SITE_OTHER): Payer: Medicare Other

## 2020-09-09 DIAGNOSIS — J309 Allergic rhinitis, unspecified: Secondary | ICD-10-CM

## 2020-09-12 DIAGNOSIS — H2512 Age-related nuclear cataract, left eye: Secondary | ICD-10-CM | POA: Diagnosis not present

## 2020-09-17 ENCOUNTER — Ambulatory Visit (INDEPENDENT_AMBULATORY_CARE_PROVIDER_SITE_OTHER): Payer: Medicare Other

## 2020-09-17 DIAGNOSIS — J309 Allergic rhinitis, unspecified: Secondary | ICD-10-CM | POA: Diagnosis not present

## 2020-09-23 DIAGNOSIS — M25511 Pain in right shoulder: Secondary | ICD-10-CM | POA: Diagnosis not present

## 2020-09-26 DIAGNOSIS — H2511 Age-related nuclear cataract, right eye: Secondary | ICD-10-CM | POA: Diagnosis not present

## 2020-10-03 ENCOUNTER — Ambulatory Visit (INDEPENDENT_AMBULATORY_CARE_PROVIDER_SITE_OTHER): Payer: Medicare Other

## 2020-10-03 DIAGNOSIS — J309 Allergic rhinitis, unspecified: Secondary | ICD-10-CM | POA: Diagnosis not present

## 2020-10-03 DIAGNOSIS — R928 Other abnormal and inconclusive findings on diagnostic imaging of breast: Secondary | ICD-10-CM | POA: Diagnosis not present

## 2020-10-03 DIAGNOSIS — N6489 Other specified disorders of breast: Secondary | ICD-10-CM | POA: Diagnosis not present

## 2020-10-07 ENCOUNTER — Ambulatory Visit (INDEPENDENT_AMBULATORY_CARE_PROVIDER_SITE_OTHER): Payer: Medicare Other | Admitting: Family Medicine

## 2020-10-07 ENCOUNTER — Encounter: Payer: Self-pay | Admitting: Family Medicine

## 2020-10-07 ENCOUNTER — Other Ambulatory Visit: Payer: Self-pay

## 2020-10-07 VITALS — BP 111/68 | HR 84 | Temp 98.2°F | Ht 61.0 in | Wt 113.0 lb

## 2020-10-07 DIAGNOSIS — F419 Anxiety disorder, unspecified: Secondary | ICD-10-CM | POA: Diagnosis not present

## 2020-10-07 DIAGNOSIS — Z23 Encounter for immunization: Secondary | ICD-10-CM | POA: Diagnosis not present

## 2020-10-07 MED ORDER — ALPRAZOLAM 0.5 MG PO TABS: ORAL_TABLET | ORAL | 0 refills | Status: DC

## 2020-10-07 NOTE — Progress Notes (Signed)
11/1/20212:11 PM  Anne Mills 11-12-1946, 74 y.o., female [id number]  Chief Complaint  Patient presents with  . Medication Refill    alprazolam 0.5 mg for upcoming MRI for R shoulder    HPI:   Patient is a 74 y.o. female with past medical history significant for Asthma, allergies, DDD, GERD who presents today for Medication refill  She has been busy with doctors appointments Her husband recently had surgery She Recently had Cataract surgery Dentist for fixing a crown and then got an infection Spinal scoliosis: Needs MRI, got an injection Right arm pain, still hurts but a little better  Requesting Xanax for MRI ordered   Depression screen [facility] 2/9 10/07/2020 03/12/2020 01/10/2020  Decreased Interest 0 0 0  Down, Depressed, Hopeless 0 0 0  PHQ - 2 Score 0 0 0    Fall Risk  10/07/2020 06/13/2020 03/12/2020 01/10/2020 06/01/2019  Falls in the past year? 0 0 0 0 0  Number falls in past yr: 0 0 0 0 -  Injury with Fall? 0 0 0 0 0  Follow up Falls evaluation completed - Falls evaluation completed Falls evaluation completed;Education provided -     Allergies  Allergen Reactions  . Gluten Meal Cough  . Biaxin [Clarithromycin] Nausea And Vomiting    Prior to Admission medications   Medication Sig Start Date End Date Taking? Authorizing Provider  ALPRAZolam (XANAX) 0.5 MG tablet TAKE 1 TABLET BY MOUTH AS A ONE TIME DOSE HALF AN HOUR PRIOR TO MRI 03/07/20  Yes [provider]  B Complex Vitamins (VITAMIN B-COMPLEX PO) Take by mouth. Liquid   Yes [provider]  Calcium Carbonate-Vitamin D (CALCIUM 600 + D PO) Take 1 tablet by mouth daily.   Yes [provider]  Camphor-Menthol-Methyl Sal 1.2-5.7-6.3 % PTCH Apply topically.   Yes [provider]  Cholecalciferol 2000 UNITS TABS Take 1 tablet by mouth daily.   Yes [provider]  cyclobenzaprine (FLEXERIL) 5 MG tablet Take 1 tablet (5 mg total) by mouth at bedtime. 04/26/20  Yes [name], MD  EPINEPHrine (EPIPEN 2-PAK) 0.3 mg/0.3 mL IJ SOAJ injection Use as directed for severe allergic reaction 07/07/18  Yes Kozlow, [name], MD  EVENING PRIMROSE OIL PO Take 1,300 mg by mouth.   Yes [provider]  gabapentin (NEURONTIN) 300 MG capsule Take 300 mg by mouth 3 (three) times daily.   Yes [provider]  GELATIN PO Take 1,300 mg by mouth.   Yes [provider]  ibuprofen (ADVIL,MOTRIN) 600 MG tablet Take 1 tablet (600 mg total) by mouth every 8 (eight) hours as needed. 11/02/18  Yes [name], MD  Magnesium 500 MG CAPS Take by mouth.   Yes [provider]  Misc Natural Products (OSTEO BI-FLEX TRIPLE STRENGTH) TABS Take 2 tablets by mouth daily.   Yes [provider]  Multiple Vitamins-Minerals (STRESS TAB NF) TABS Take 1 tablet by mouth daily.   Yes [provider]  NAPROXEN PO Take by mouth as needed.   Yes [provider]  NON FORMULARY cbd oil / cbd cream   Yes [provider]  Omega-3 Fatty Acids (FISH OIL PO) Take by mouth.   Yes [provider]  OVER THE COUNTER MEDICATION Transitions w/Hesperidin vegetarian taking   Yes [provider]  OVER THE COUNTER MEDICATION Wal-Finate Dw/Pseudoephedrine taking   Yes [provider]  Probiotic Product (PROBIOTIC PO) Take by mouth. 10 Billion Active Cultures  Yes [provider]  PSEUDOEPHED-CHLORPHEN-DM-GG PO Take by mouth.   Yes [provider]  Trolamine Salicylate (ASPERCREME EX) Apply 1 application topically 2 (two) times daily as needed (for back pain).   Yes [provider]    Past Medical History:  Diagnosis Date  . Allergy   . Asthma    no inhaler  . Back pain, chronic   . Chest pain    under left breast- 2-3 x per week: h/o breast surgery  . GERD (gastroesophageal reflux disease)   . Headache(784.0)   . Heart murmur    "not serious" per pt-no echo  . Scoliosis   . Seasonal allergies     . Shortness of breath    per pt due to allergies    Past Surgical History:  Procedure Laterality Date  . ABDOMINOPLASTY    . BREAST SURGERY     lt cyst  . COLONOSCOPY    . DILATION AND CURETTAGE OF UTERUS N/A 03/03/2013   Procedure: DILATATION AND CURETTAGE;  Surgeon: Lahoma Crocker, MD;  Location: [facility];  Service: Gynecology;  Laterality: N/A;  . EXAMINATION UNDER ANESTHESIA N/A 03/03/2013   Procedure: EXAM UNDER ANESTHESIA with ultrasound;  Surgeon: Lahoma Crocker, MD;  Location: [facility];  Service: Gynecology;  Laterality: N/A;  . EYE SURGERY  10/21 ---10/7 /2021   catarct both eyes  . TRIGGER FINGER RELEASE Left 06/21/2014   Procedure: LEFT THUMB TRIGGER RELEASE;  Surgeon: Tennis Must, MD;  Location: [facility];  Service: Orthopedics;  Laterality: Left;    Social History   Tobacco Use  . Smoking status: Never Smoker  . Smokeless tobacco: Never Used  Substance Use Topics  . Alcohol use: No    Family History  Problem Relation Age of Onset  . Dementia Mother   . Hypertension Mother   . Cancer Mother 72       Breast cancer  . Depression Mother   . Cancer Sister   . Diabetes Sister   . Heart disease Sister 49       AMI as cause of death  . Hyperlipidemia Sister   . Hypertension Sister   . Mental retardation Sister   . Cancer Father        prostate cancer  . Heart disease Sister 90       AMI  . Diabetes Sister     Review of Systems  Constitutional: Negative for chills, fever and malaise/fatigue.  Eyes: Negative for blurred vision and double vision.  Respiratory: Negative for cough, shortness of breath and wheezing.   Cardiovascular: Negative for chest pain, palpitations and leg swelling.  Gastrointestinal: Negative for abdominal pain, blood in stool, constipation, diarrhea, heartburn, nausea and vomiting.  Genitourinary: Negative for dysuria, frequency and hematuria.  Musculoskeletal: Negative for back pain, falls, joint pain and myalgias.        Right arm and shoulder pain  Skin: Negative for rash.  Neurological: Negative for dizziness, weakness and headaches.     OBJECTIVE:  Today's Vitals   10/07/20 1345  BP: 111/68  Pulse: 84  Temp: 98.2 F (36.8 C)  SpO2: 97%  Weight: 113 lb (51.3 kg)  Height: 5\' 1"  (1.549 m)   Body mass index is 21.35 kg/m.   Physical Exam Constitutional:      General: She is not in acute distress.    Appearance: Normal appearance. She is not ill-appearing.  HENT:     Head: Normocephalic.  Cardiovascular:  Rate and Rhythm: Normal rate and regular rhythm.     Pulses: Normal pulses.     Heart sounds: Normal heart sounds. No murmur heard.  No friction rub. No gallop.   Pulmonary:     Effort: Pulmonary effort is normal. No respiratory distress.     Breath sounds: Normal breath sounds. No stridor. No wheezing, rhonchi or rales.  Abdominal:     General: Bowel sounds are normal.     Palpations: Abdomen is soft.     Tenderness: There is no abdominal tenderness.  Musculoskeletal:     Right lower leg: No edema.     Left lower leg: No edema.     Comments: Right arm pain with movement, strength 5/5, no obvious deformities or point tenderness  Skin:    General: Skin is warm and dry.  Neurological:     Mental Status: She is alert and oriented to person, place, and time.  Psychiatric:        Mood and Affect: Mood normal.        Behavior: Behavior normal.     No results found for this or any previous visit (from the past 24 hour(s)).  No results found.   ASSESSMENT and PLAN  Problem List Items Addressed This Visit    None    Visit Diagnoses    Need for immunization against influenza    -  Primary   Relevant Orders   Flu Vaccine QUAD High Dose(Fluad)   Anxiety       Relevant Medications   ALPRAZolam (XANAX) 0.5 MG tablet     Xanax for MRI ordered  Return for At next scheduled appointment December.    Huston Foley Ivoree Felmlee, FNP-BC [facility] at [facility] [place], [place] [address] Ph.  [phone] Fax [phone]

## 2020-10-07 NOTE — Patient Instructions (Addendum)
Medications sent to Pharmacy  Health Maintenance After Age 73 After age 18, you are at a higher risk for certain long-term diseases and infections as well as injuries from falls. Falls are a major cause of broken bones and head injuries in people who are older than age 73. Getting regular preventive care can help to keep you healthy and well. Preventive care includes getting regular testing and making lifestyle changes as recommended by your health care provider. Talk with your health care provider about:  Which screenings and tests you should have. A screening is a test that checks for a disease when you have no symptoms.  A diet and exercise plan that is right for you. What should I know about screenings and tests to prevent falls? Screening and testing are the best ways to find a health problem early. Early diagnosis and treatment give you the best chance of managing medical conditions that are common after age 25. Certain conditions and lifestyle choices may make you more likely to have a fall. Your health care provider may recommend:  Regular vision checks. Poor vision and conditions such as cataracts can make you more likely to have a fall. If you wear glasses, make sure to get your prescription updated if your vision changes.  Medicine review. Work with your health care provider to regularly review all of the medicines you are taking, including over-the-counter medicines. Ask your health care provider about any side effects that may make you more likely to have a fall. Tell your health care provider if any medicines that you take make you feel dizzy or sleepy.  Osteoporosis screening. Osteoporosis is a condition that causes the bones to get weaker. This can make the bones weak and cause them to break more easily.  Blood pressure screening. Blood pressure changes and medicines to control blood pressure can make you feel dizzy.  Strength and balance checks. Your health care provider may  recommend certain tests to check your strength and balance while standing, walking, or changing positions.  Foot health exam. Foot pain and numbness, as well as not wearing proper footwear, can make you more likely to have a fall.  Depression screening. You may be more likely to have a fall if you have a fear of falling, feel emotionally low, or feel unable to do activities that you used to do.  Alcohol use screening. Using too much alcohol can affect your balance and may make you more likely to have a fall. What actions can I take to lower my risk of falls? General instructions  Talk with your health care provider about your risks for falling. Tell your health care provider if: ? You fall. Be sure to tell your health care provider about all falls, even ones that seem minor. ? You feel dizzy, sleepy, or off-balance.  Take over-the-counter and prescription medicines only as told by your health care provider. These include any supplements.  Eat a healthy diet and maintain a healthy weight. A healthy diet includes low-fat dairy products, low-fat (lean) meats, and fiber from whole grains, beans, and lots of fruits and vegetables. Home safety  Remove any tripping hazards, such as rugs, cords, and clutter.  Install safety equipment such as grab bars in bathrooms and safety rails on stairs.  Keep rooms and walkways well-lit. Activity   Follow a regular exercise program to stay fit. This will help you maintain your balance. Ask your health care provider what types of exercise are appropriate for you.  If  you need a cane or walker, use it as recommended by your health care provider.  Wear supportive shoes that have nonskid soles. Lifestyle  Do not drink alcohol if your health care provider tells you not to drink.  If you drink alcohol, limit how much you have: ? 0-1 drink a day for women. ? 0-2 drinks a day for men.  Be aware of how much alcohol is in your drink. In the [place] one drink  equals one typical bottle of beer (12 oz), one-half glass of wine (5 oz), or one shot of hard liquor (1 oz).  Do not use any products that contain nicotine or tobacco, such as cigarettes and e-cigarettes. If you need help quitting, ask your health care provider. Summary  Having a healthy lifestyle and getting preventive care can help to protect your health and wellness after age 67.  Screening and testing are the best way to find a health problem early and help you avoid having a fall. Early diagnosis and treatment give you the best chance for managing medical conditions that are more common for people who are older than age 48.  Falls are a major cause of broken bones and head injuries in people who are older than age 84. Take precautions to prevent a fall at home.  Work with your health care provider to learn what changes you can make to improve your health and wellness and to prevent falls. This information is not intended to replace advice given to you by your health care provider. Make sure you discuss any questions you have with your health care provider. Document Revised: 03/16/2019 Document Reviewed: 10/06/2017 Elsevier Patient Education  [employer].  If you have lab work done today you will be contacted with your lab results within the next 2 weeks.  If you have not heard from [place] then please contact us. The fastest way to get your results is to register for My Chart.   IF you received an x-ray today, you will receive an invoice from [facility]. Please contact [facility] at [phone] with questions or concerns regarding your invoice.   IF you received labwork today, you will receive an invoice from [facility]. Please contact [facility] at [phone] with questions or concerns regarding your invoice.   Our billing staff will not be able to assist you with questions regarding bills from these companies.  You will be contacted with the lab results as soon  as they are available. The fastest way to get your results is to activate your My Chart account. Instructions are located on the last page of this paperwork. If you have not heard from [place] regarding the results in 2 weeks, please contact this office.

## 2020-10-10 ENCOUNTER — Ambulatory Visit (INDEPENDENT_AMBULATORY_CARE_PROVIDER_SITE_OTHER): Payer: Medicare Other

## 2020-10-10 DIAGNOSIS — J309 Allergic rhinitis, unspecified: Secondary | ICD-10-CM | POA: Diagnosis not present

## 2020-10-14 DIAGNOSIS — M25511 Pain in right shoulder: Secondary | ICD-10-CM | POA: Diagnosis not present

## 2020-10-18 ENCOUNTER — Ambulatory Visit (INDEPENDENT_AMBULATORY_CARE_PROVIDER_SITE_OTHER): Payer: Medicare Other

## 2020-10-18 DIAGNOSIS — J309 Allergic rhinitis, unspecified: Secondary | ICD-10-CM | POA: Diagnosis not present

## 2020-10-28 DIAGNOSIS — M25511 Pain in right shoulder: Secondary | ICD-10-CM | POA: Diagnosis not present

## 2020-10-29 ENCOUNTER — Ambulatory Visit (INDEPENDENT_AMBULATORY_CARE_PROVIDER_SITE_OTHER): Payer: Medicare Other

## 2020-10-29 DIAGNOSIS — J309 Allergic rhinitis, unspecified: Secondary | ICD-10-CM

## 2020-11-19 ENCOUNTER — Ambulatory Visit (INDEPENDENT_AMBULATORY_CARE_PROVIDER_SITE_OTHER): Payer: Medicare Other

## 2020-11-19 DIAGNOSIS — J309 Allergic rhinitis, unspecified: Secondary | ICD-10-CM

## 2020-11-21 DIAGNOSIS — N6320 Unspecified lump in the left breast, unspecified quadrant: Secondary | ICD-10-CM | POA: Diagnosis not present

## 2020-11-28 ENCOUNTER — Ambulatory Visit (INDEPENDENT_AMBULATORY_CARE_PROVIDER_SITE_OTHER): Payer: Medicare Other

## 2020-11-28 DIAGNOSIS — J309 Allergic rhinitis, unspecified: Secondary | ICD-10-CM | POA: Diagnosis not present

## 2020-12-04 ENCOUNTER — Encounter: Payer: Medicare Other | Admitting: Family Medicine

## 2020-12-10 ENCOUNTER — Ambulatory Visit (INDEPENDENT_AMBULATORY_CARE_PROVIDER_SITE_OTHER): Payer: Medicare Other

## 2020-12-10 DIAGNOSIS — J309 Allergic rhinitis, unspecified: Secondary | ICD-10-CM

## 2020-12-18 ENCOUNTER — Ambulatory Visit (INDEPENDENT_AMBULATORY_CARE_PROVIDER_SITE_OTHER): Payer: Medicare Other | Admitting: *Deleted

## 2020-12-18 DIAGNOSIS — J309 Allergic rhinitis, unspecified: Secondary | ICD-10-CM

## 2021-01-10 ENCOUNTER — Ambulatory Visit (INDEPENDENT_AMBULATORY_CARE_PROVIDER_SITE_OTHER): Payer: Medicare Other | Admitting: *Deleted

## 2021-01-10 DIAGNOSIS — J309 Allergic rhinitis, unspecified: Secondary | ICD-10-CM | POA: Diagnosis not present

## 2021-01-21 ENCOUNTER — Ambulatory Visit (INDEPENDENT_AMBULATORY_CARE_PROVIDER_SITE_OTHER): Payer: Medicare Other | Admitting: *Deleted

## 2021-01-21 DIAGNOSIS — J309 Allergic rhinitis, unspecified: Secondary | ICD-10-CM | POA: Diagnosis not present

## 2021-02-04 DIAGNOSIS — J301 Allergic rhinitis due to pollen: Secondary | ICD-10-CM | POA: Diagnosis not present

## 2021-02-04 NOTE — Progress Notes (Signed)
VIAL EXP 02-04-22

## 2021-02-05 ENCOUNTER — Encounter: Payer: Self-pay | Admitting: Emergency Medicine

## 2021-02-05 DIAGNOSIS — Z1231 Encounter for screening mammogram for malignant neoplasm of breast: Secondary | ICD-10-CM | POA: Diagnosis not present

## 2021-02-05 DIAGNOSIS — Z803 Family history of malignant neoplasm of breast: Secondary | ICD-10-CM | POA: Diagnosis not present

## 2021-02-06 ENCOUNTER — Ambulatory Visit (INDEPENDENT_AMBULATORY_CARE_PROVIDER_SITE_OTHER): Payer: Medicare Other

## 2021-02-06 DIAGNOSIS — J309 Allergic rhinitis, unspecified: Secondary | ICD-10-CM | POA: Diagnosis not present

## 2021-02-13 ENCOUNTER — Ambulatory Visit (INDEPENDENT_AMBULATORY_CARE_PROVIDER_SITE_OTHER): Payer: Medicare Other

## 2021-02-13 DIAGNOSIS — J309 Allergic rhinitis, unspecified: Secondary | ICD-10-CM

## 2021-02-19 ENCOUNTER — Ambulatory Visit (INDEPENDENT_AMBULATORY_CARE_PROVIDER_SITE_OTHER): Payer: Medicare Other

## 2021-02-19 DIAGNOSIS — J309 Allergic rhinitis, unspecified: Secondary | ICD-10-CM | POA: Diagnosis not present

## 2021-02-28 ENCOUNTER — Ambulatory Visit (INDEPENDENT_AMBULATORY_CARE_PROVIDER_SITE_OTHER): Payer: Medicare Other | Admitting: *Deleted

## 2021-02-28 DIAGNOSIS — J309 Allergic rhinitis, unspecified: Secondary | ICD-10-CM | POA: Diagnosis not present

## 2021-03-04 ENCOUNTER — Encounter: Payer: Medicare Other | Admitting: Emergency Medicine

## 2021-03-25 ENCOUNTER — Encounter: Payer: Self-pay | Admitting: Allergy and Immunology

## 2021-03-25 ENCOUNTER — Other Ambulatory Visit: Payer: Self-pay

## 2021-03-25 ENCOUNTER — Ambulatory Visit: Payer: Medicare Other | Admitting: Allergy and Immunology

## 2021-03-25 VITALS — BP 108/60 | HR 93 | Temp 98.2°F | Resp 14 | Ht 62.5 in | Wt 113.2 lb

## 2021-03-25 DIAGNOSIS — J309 Allergic rhinitis, unspecified: Secondary | ICD-10-CM | POA: Diagnosis not present

## 2021-03-25 DIAGNOSIS — J3089 Other allergic rhinitis: Secondary | ICD-10-CM | POA: Diagnosis not present

## 2021-03-25 NOTE — Patient Instructions (Signed)
  1.  Restart immunotherapy    2.  Use OTC Flonase -1 spray each nostril 1-7 times per week if needed  3.  Return to clinic in 12 months or earlier if problem

## 2021-03-25 NOTE — Progress Notes (Signed)
[facility] [facility] [place]   Follow-up Note  Referring Provider: Jacelyn Pi, Lilia Argue, * Primary Provider: Patient, No Pcp Per (Inactive) Date of Office Visit: 03/25/2021  Subjective:   MARGUETTA WINDISH (DOB: [birth year]) is a 75 y.o. female who returns to the [facility] on 03/25/2021 in re-evaluation of the following:  HPI: Honor returns to this clinic in evaluation of allergic rhinitis and a history of reflux.  Her last visit to this clinic was 20 March 2020.  She has really done well over the course of the past year while utilizing immunotherapy currently at every week without any adverse effect and occasionally using some nasal steroid.  She has not required a systemic steroid or antibiotic for any type of airway issue.  Overall she is very satisfied with the response that she has received regarding her atopic disease while using her current plan.  Her reflux is under good control utilizing her apple cider vinegar administration.  She has received [place] vaccinations.  Allergies as of 03/25/2021      Reactions   Gluten Meal Cough   Biaxin [clarithromycin] Nausea And Vomiting      Medication List            ALPRAZolam 0.5 MG tablet Commonly known as: XANAX TAKE 1 TABLET BY MOUTH AS A ONE TIME DOSE HALF AN HOUR PRIOR TO MRI   ASPERCREME EX Apply 1 application topically 2 (two) times daily as needed (for back pain).   CALCIUM 600 + D PO Take 1 tablet by mouth daily.   Camphor-Menthol-Methyl Sal 1.2-5.7-6.3 % Ptch Apply topically.   Cholecalciferol 50 MCG (2000 UT) Tabs Take 1 tablet by mouth daily.   cyclobenzaprine 5 MG tablet Commonly known as: FLEXERIL Take 1 tablet (5 mg total) by mouth at bedtime.   EPINEPHrine 0.3 mg/0.3 mL Soaj injection Commonly known as: EpiPen 2-Pak Use as directed for severe allergic reaction   EVENING PRIMROSE OIL PO Take 1,300 mg by mouth.   gabapentin 300 MG  capsule Commonly known as: NEURONTIN Take 300 mg by mouth 3 (three) times daily.   GELATIN PO Take 1,300 mg by mouth.   Magnesium 500 MG Caps Take by mouth.   NON FORMULARY cbd oil / cbd cream   Osteo Bi-Flex Triple Strength Tabs Take 2 tablets by mouth daily.   OVER THE COUNTER MEDICATION Transitions w/Hesperidin vegetarian taking   OVER THE COUNTER MEDICATION Wal-Finate Dw/Pseudoephedrine taking   PROBIOTIC PO Take by mouth. 10 Billion Active Cultures   Stress Tab NF Tabs Take 1 tablet by mouth daily.   VITAMIN B-COMPLEX PO Take by mouth. Liquid       Past Medical History:  Diagnosis Date  . Allergy   . Asthma    no inhaler  . Back pain, chronic   . Chest pain    under left breast- 2-3 x per week: h/o breast surgery  . GERD (gastroesophageal reflux disease)   . Headache(784.0)   . Heart murmur    "not serious" per pt-no echo  . Scoliosis   . Seasonal allergies   . Shortness of breath    per pt due to allergies    Past Surgical History:  Procedure Laterality Date  . ABDOMINOPLASTY    . BREAST SURGERY     lt cyst  . COLONOSCOPY    . DILATION AND CURETTAGE OF UTERUS N/A 03/03/2013   Procedure: DILATATION AND CURETTAGE;  Surgeon:  Lahoma Crocker, MD;  Location: [facility];  Service: Gynecology;  Laterality: N/A;  . EXAMINATION UNDER ANESTHESIA N/A 03/03/2013   Procedure: EXAM UNDER ANESTHESIA with ultrasound;  Surgeon: Lahoma Crocker, MD;  Location: [facility];  Service: Gynecology;  Laterality: N/A;  . EYE SURGERY  10/21 ---10/7 /2021   catarct both eyes  . TRIGGER FINGER RELEASE Left 06/21/2014   Procedure: LEFT THUMB TRIGGER RELEASE;  Surgeon: Tennis Must, MD;  Location: [facility];  Service: Orthopedics;  Laterality: Left;    Review of systems negative except as noted in HPI / PMHx or noted below:  Review of Systems  Constitutional: Negative.   HENT: Negative.   Eyes: Negative.   Respiratory: Negative.   Cardiovascular:  Negative.   Gastrointestinal: Negative.   Genitourinary: Negative.   Musculoskeletal: Negative.   Skin: Negative.   Neurological: Negative.   Endo/Heme/Allergies: Negative.   Psychiatric/Behavioral: Negative.      Objective:   Vitals:   03/25/21 1155  BP: 108/60  Pulse: 93  Resp: 14  Temp: 98.2 F (36.8 C)  SpO2: 99%   Height: 5' 2.5" (158.8 cm)  Weight: 113 lb 3.2 oz (51.3 kg)   Physical Exam Constitutional:      Appearance: She is not diaphoretic.  HENT:     Head: Normocephalic.     Right Ear: Tympanic membrane, ear canal and external ear normal.     Left Ear: Tympanic membrane, ear canal and external ear normal.     Nose: Nose normal. No mucosal edema or rhinorrhea.     Mouth/Throat:     Pharynx: Uvula midline. No oropharyngeal exudate.  Eyes:     Conjunctiva/sclera: Conjunctivae normal.  Neck:     Thyroid: No thyromegaly.     Trachea: Trachea normal. No tracheal tenderness or tracheal deviation.  Cardiovascular:     Rate and Rhythm: Normal rate and regular rhythm.     Heart sounds: Normal heart sounds, S1 normal and S2 normal. No murmur heard.   Pulmonary:     Effort: No respiratory distress.     Breath sounds: Normal breath sounds. No stridor. No wheezing or rales.  Lymphadenopathy:     Head:     Right side of head: No tonsillar adenopathy.     Left side of head: No tonsillar adenopathy.     Cervical: No cervical adenopathy.  Skin:    Findings: No erythema or rash.     Nails: There is no clubbing.  Neurological:     Mental Status: She is alert.     Diagnostics: none   Assessment and Plan:   1. Allergic rhinitis, unspecified seasonality, unspecified trigger   2. Perennial allergic rhinitis     1.  Restart immunotherapy    2.  Use OTC Flonase -1 spray each nostril 1-7 times per week if needed  3.  Return to clinic in 12 months or earlier if problem  Skylynn is really doing very well and she will continue to receive immunotherapy at this  point in time and I will see her back in his clinic in 1 year or earlier if there is a problem.  Allena Katz, MD Allergy / Immunology [place]

## 2021-03-26 ENCOUNTER — Encounter: Payer: Self-pay | Admitting: Allergy and Immunology

## 2021-03-31 ENCOUNTER — Ambulatory Visit (INDEPENDENT_AMBULATORY_CARE_PROVIDER_SITE_OTHER): Payer: Medicare Other | Admitting: *Deleted

## 2021-03-31 DIAGNOSIS — J309 Allergic rhinitis, unspecified: Secondary | ICD-10-CM | POA: Diagnosis not present

## 2021-04-09 ENCOUNTER — Ambulatory Visit (INDEPENDENT_AMBULATORY_CARE_PROVIDER_SITE_OTHER): Payer: Medicare Other

## 2021-04-09 DIAGNOSIS — J309 Allergic rhinitis, unspecified: Secondary | ICD-10-CM | POA: Diagnosis not present

## 2021-04-17 ENCOUNTER — Ambulatory Visit (INDEPENDENT_AMBULATORY_CARE_PROVIDER_SITE_OTHER): Payer: Medicare Other | Admitting: *Deleted

## 2021-04-17 DIAGNOSIS — J309 Allergic rhinitis, unspecified: Secondary | ICD-10-CM

## 2021-05-14 ENCOUNTER — Ambulatory Visit (INDEPENDENT_AMBULATORY_CARE_PROVIDER_SITE_OTHER): Payer: Medicare Other

## 2021-05-14 DIAGNOSIS — J309 Allergic rhinitis, unspecified: Secondary | ICD-10-CM | POA: Diagnosis not present

## 2021-05-21 ENCOUNTER — Ambulatory Visit (INDEPENDENT_AMBULATORY_CARE_PROVIDER_SITE_OTHER): Payer: Medicare Other

## 2021-05-21 DIAGNOSIS — J309 Allergic rhinitis, unspecified: Secondary | ICD-10-CM | POA: Diagnosis not present

## 2021-05-28 ENCOUNTER — Ambulatory Visit (INDEPENDENT_AMBULATORY_CARE_PROVIDER_SITE_OTHER): Payer: Medicare Other

## 2021-05-28 DIAGNOSIS — J309 Allergic rhinitis, unspecified: Secondary | ICD-10-CM

## 2021-06-02 ENCOUNTER — Ambulatory Visit (INDEPENDENT_AMBULATORY_CARE_PROVIDER_SITE_OTHER): Payer: Medicare Other

## 2021-06-02 DIAGNOSIS — J309 Allergic rhinitis, unspecified: Secondary | ICD-10-CM

## 2021-06-10 ENCOUNTER — Ambulatory Visit (INDEPENDENT_AMBULATORY_CARE_PROVIDER_SITE_OTHER): Payer: Medicare Other | Admitting: *Deleted

## 2021-06-10 DIAGNOSIS — J309 Allergic rhinitis, unspecified: Secondary | ICD-10-CM

## 2021-06-19 ENCOUNTER — Ambulatory Visit (INDEPENDENT_AMBULATORY_CARE_PROVIDER_SITE_OTHER): Payer: Medicare Other | Admitting: *Deleted

## 2021-06-19 DIAGNOSIS — J309 Allergic rhinitis, unspecified: Secondary | ICD-10-CM | POA: Diagnosis not present

## 2021-06-23 ENCOUNTER — Ambulatory Visit (INDEPENDENT_AMBULATORY_CARE_PROVIDER_SITE_OTHER): Payer: Medicare Other | Admitting: *Deleted

## 2021-06-23 DIAGNOSIS — J309 Allergic rhinitis, unspecified: Secondary | ICD-10-CM

## 2021-07-01 ENCOUNTER — Ambulatory Visit (INDEPENDENT_AMBULATORY_CARE_PROVIDER_SITE_OTHER): Payer: Medicare Other | Admitting: *Deleted

## 2021-07-01 DIAGNOSIS — J309 Allergic rhinitis, unspecified: Secondary | ICD-10-CM | POA: Diagnosis not present

## 2021-07-03 DIAGNOSIS — J302 Other seasonal allergic rhinitis: Secondary | ICD-10-CM | POA: Diagnosis not present

## 2021-07-07 ENCOUNTER — Ambulatory Visit (INDEPENDENT_AMBULATORY_CARE_PROVIDER_SITE_OTHER): Payer: Medicare Other

## 2021-07-07 DIAGNOSIS — J309 Allergic rhinitis, unspecified: Secondary | ICD-10-CM | POA: Diagnosis not present

## 2021-07-07 NOTE — Progress Notes (Signed)
VIAL MADE. EXP 07-07-22

## 2021-07-17 ENCOUNTER — Ambulatory Visit (INDEPENDENT_AMBULATORY_CARE_PROVIDER_SITE_OTHER): Payer: Medicare Other | Admitting: *Deleted

## 2021-07-17 DIAGNOSIS — J309 Allergic rhinitis, unspecified: Secondary | ICD-10-CM

## 2021-07-31 ENCOUNTER — Ambulatory Visit (INDEPENDENT_AMBULATORY_CARE_PROVIDER_SITE_OTHER): Payer: Medicare Other | Admitting: *Deleted

## 2021-07-31 DIAGNOSIS — J309 Allergic rhinitis, unspecified: Secondary | ICD-10-CM | POA: Diagnosis not present

## 2021-08-06 ENCOUNTER — Ambulatory Visit (INDEPENDENT_AMBULATORY_CARE_PROVIDER_SITE_OTHER): Payer: Medicare Other

## 2021-08-06 ENCOUNTER — Ambulatory Visit: Payer: Medicare Other | Admitting: Internal Medicine

## 2021-08-06 DIAGNOSIS — J309 Allergic rhinitis, unspecified: Secondary | ICD-10-CM

## 2021-08-14 ENCOUNTER — Ambulatory Visit (INDEPENDENT_AMBULATORY_CARE_PROVIDER_SITE_OTHER): Payer: Medicare Other

## 2021-08-14 DIAGNOSIS — J309 Allergic rhinitis, unspecified: Secondary | ICD-10-CM

## 2021-08-21 ENCOUNTER — Ambulatory Visit (INDEPENDENT_AMBULATORY_CARE_PROVIDER_SITE_OTHER): Payer: Medicare Other | Admitting: *Deleted

## 2021-08-21 DIAGNOSIS — J309 Allergic rhinitis, unspecified: Secondary | ICD-10-CM | POA: Diagnosis not present

## 2021-08-29 ENCOUNTER — Ambulatory Visit (INDEPENDENT_AMBULATORY_CARE_PROVIDER_SITE_OTHER): Payer: Medicare Other

## 2021-08-29 DIAGNOSIS — J309 Allergic rhinitis, unspecified: Secondary | ICD-10-CM

## 2021-09-17 ENCOUNTER — Ambulatory Visit (INDEPENDENT_AMBULATORY_CARE_PROVIDER_SITE_OTHER): Payer: Medicare Other

## 2021-09-17 DIAGNOSIS — J309 Allergic rhinitis, unspecified: Secondary | ICD-10-CM

## 2021-10-08 ENCOUNTER — Ambulatory Visit (INDEPENDENT_AMBULATORY_CARE_PROVIDER_SITE_OTHER): Payer: Medicare Other

## 2021-10-08 DIAGNOSIS — J309 Allergic rhinitis, unspecified: Secondary | ICD-10-CM | POA: Diagnosis not present

## 2021-10-23 ENCOUNTER — Ambulatory Visit (INDEPENDENT_AMBULATORY_CARE_PROVIDER_SITE_OTHER): Payer: Medicare Other

## 2021-10-23 DIAGNOSIS — J309 Allergic rhinitis, unspecified: Secondary | ICD-10-CM | POA: Diagnosis not present

## 2021-11-06 ENCOUNTER — Encounter: Payer: Self-pay | Admitting: Allergy

## 2021-11-06 ENCOUNTER — Ambulatory Visit: Payer: Self-pay | Admitting: *Deleted

## 2021-11-06 ENCOUNTER — Other Ambulatory Visit: Payer: Self-pay

## 2021-11-06 ENCOUNTER — Ambulatory Visit: Payer: Medicare Other | Admitting: Allergy

## 2021-11-06 VITALS — BP 132/70 | HR 113 | Temp 97.5°F | Resp 18 | Ht 61.0 in | Wt 117.4 lb

## 2021-11-06 DIAGNOSIS — J3089 Other allergic rhinitis: Secondary | ICD-10-CM | POA: Diagnosis not present

## 2021-11-06 DIAGNOSIS — J018 Other acute sinusitis: Secondary | ICD-10-CM | POA: Diagnosis not present

## 2021-11-06 MED ORDER — AMOXICILLIN-POT CLAVULANATE 875-125 MG PO TABS: 1.0000 | ORAL_TABLET | Freq: Two times a day (BID) | ORAL | 0 refills | Status: DC

## 2021-11-06 NOTE — Progress Notes (Signed)
Follow-up Note  RE: Anne Mills MRN: [id number] DOB: May 05, 1946 Date of Office Visit: 11/06/2021   History of present illness: Anne Mills is a 75 y.o. female presenting today for sick visit.  She was last seen in the office on 03/25/2021 by Dr. Neldon Mc.  She has history of allergic rhinitis. She states she started having symptoms around 10/26/2021 while she was visiting her grandson.  She states their house was dusty and she is allergic to dust.  She states she started feeling nauseated and did have 3-4 episodes of vomiting.  She also states she is not supposed to eat gluten products but she was helping to clean up the kitchen and there were cookies and she did eat cookies for several days.  By 11/01/21 she states she had developed coughing and congestion.  She feels like her symptoms are worsening. She states she is now bringing up green and yellow mucus from her chest and blowing out of her nose.  She also states her head feels full.  She feels like her symptoms are not improving.  She denies any fever, night sweats or chills.   She did start using dayquil and nasal decongestant.  She states her grandson's family had RSV 2 weeks before her arrival.    She did resume immunotherapy after her visit and is currently in the red vial at weekly dosing.  Review of systems: Review of Systems  Constitutional: Negative.   HENT:  Positive for congestion and sinus pain.   Eyes: Negative.   Respiratory:  Positive for cough and sputum production.   Cardiovascular: Negative.   Gastrointestinal:  Positive for nausea and vomiting.  Musculoskeletal: Negative.   Skin: Negative.   Neurological:  Positive for headaches.   All other systems negative unless noted above in HPI  Past medical/social/surgical/family history have been reviewed and are unchanged unless specifically indicated below.  No changes  Medication List: Current Outpatient Medications  Medication Sig Dispense Refill   B  Complex Vitamins (VITAMIN B-COMPLEX PO) Take by mouth. Liquid     Calcium Carbonate-Vitamin D (CALCIUM 600 + D PO) Take 1 tablet by mouth daily.     Camphor-Menthol-Methyl Sal 1.2-5.7-6.3 % PTCH Apply topically.     Cholecalciferol 2000 UNITS TABS Take 1 tablet by mouth daily.     cyclobenzaprine (FLEXERIL) 5 MG tablet Take 1 tablet (5 mg total) by mouth at bedtime. 90 tablet 1   EVENING PRIMROSE OIL PO Take 1,300 mg by mouth.     gabapentin (NEURONTIN) 300 MG capsule Take 300 mg by mouth 3 (three) times daily.     GELATIN PO Take 1,300 mg by mouth.     Magnesium 500 MG CAPS Take by mouth.     Misc Natural Products (OSTEO BI-FLEX TRIPLE STRENGTH) TABS Take 2 tablets by mouth daily.     Multiple Vitamins-Minerals (STRESS TAB NF) TABS Take 1 tablet by mouth daily.     OVER THE COUNTER MEDICATION Transitions w/Hesperidin vegetarian taking     OVER THE COUNTER MEDICATION Wal-Finate Dw/Pseudoephedrine taking     Probiotic Product (PROBIOTIC PO) Take by mouth. 10 Billion Active Cultures     Trolamine Salicylate (ASPERCREME EX) Apply 1 application topically 2 (two) times daily as needed (for back pain).     No current facility-administered medications for this visit.     Known medication allergies: Allergies  Allergen Reactions   Gluten Meal Cough   Biaxin [Clarithromycin] Nausea And Vomiting     Physical  examination: Blood pressure 132/70, pulse (!) 113, temperature (!) 97.5 F (36.4 C), temperature source Temporal, resp. rate 18, height 5\' 1"  (1.549 m), weight 117 lb 6.4 oz (53.3 kg), SpO2 99 %.  General: Alert, interactive, in no acute distress. HEENT: PERRLA TMs pearly gray, turbinates moderately edematous without discharge, post-pharynx non erythematous. Neck: Supple without lymphadenopathy. Lungs: Clear to auscultation without wheezing, rhonchi or rales. {no increased work of breathing. CV: Normal S1, S2 without murmurs. Abdomen: Nondistended, nontender. Skin: Warm and dry,  without lesions or rashes. Extremities:  No clubbing, cyanosis or edema. Neuro:   Grossly intact.  Diagnositics/Labs: None today  Assessment and plan:   Acute sinusitis -with symptoms ongoing and worsening now almost 2 weeks would treat with Augmentin 875mg  1 tab twice a day for 10 days -make sure you are well hydrated (drink plenty fluids) and rest -monitor for fevers  Allergic rhinitis -resume your allergy injections next week once you are feeling better -use Flonase 2 sprays each nostril daily for 1-2 weeks at a time before stopping once nasal congestion improves for maximum benefit  Follow-up in 6-12 months or sooner if needed  I appreciate the opportunity to take part in Anne Mills's care. Please do not hesitate to contact me with questions.  Sincerely,   Prudy Feeler, MD Allergy/Immunology [facility]

## 2021-11-06 NOTE — Patient Instructions (Addendum)
Sinus infection -with symptoms ongoing and worsening now almost 2 weeks would treat with Augmentin 875mg  1 tab twice a day for 10 days -make sure you are well hydrated (drink plenty fluids) and rest -monitor for fevers  Allergic rhinitis -resume your allergy injections next week once you are feeling better -use Flonase 2 sprays each nostril daily for 1-2 weeks at a time before stopping once nasal congestion improves for maximum benefit  Follow-up in 6-12 months or sooner if needed

## 2021-11-13 ENCOUNTER — Ambulatory Visit (INDEPENDENT_AMBULATORY_CARE_PROVIDER_SITE_OTHER): Payer: Medicare Other

## 2021-11-13 DIAGNOSIS — J309 Allergic rhinitis, unspecified: Secondary | ICD-10-CM | POA: Diagnosis not present

## 2021-11-19 ENCOUNTER — Ambulatory Visit (INDEPENDENT_AMBULATORY_CARE_PROVIDER_SITE_OTHER): Payer: Medicare Other

## 2021-11-19 DIAGNOSIS — J309 Allergic rhinitis, unspecified: Secondary | ICD-10-CM | POA: Diagnosis not present

## 2021-11-26 ENCOUNTER — Ambulatory Visit (INDEPENDENT_AMBULATORY_CARE_PROVIDER_SITE_OTHER): Payer: Medicare Other

## 2021-11-26 DIAGNOSIS — J309 Allergic rhinitis, unspecified: Secondary | ICD-10-CM

## 2021-12-02 NOTE — Progress Notes (Signed)
VIAL MADE. EXP 12-02-22

## 2021-12-03 DIAGNOSIS — J302 Other seasonal allergic rhinitis: Secondary | ICD-10-CM | POA: Diagnosis not present

## 2021-12-04 ENCOUNTER — Ambulatory Visit (INDEPENDENT_AMBULATORY_CARE_PROVIDER_SITE_OTHER): Payer: Medicare Other | Admitting: *Deleted

## 2021-12-04 DIAGNOSIS — J309 Allergic rhinitis, unspecified: Secondary | ICD-10-CM | POA: Diagnosis not present

## 2021-12-11 ENCOUNTER — Ambulatory Visit (INDEPENDENT_AMBULATORY_CARE_PROVIDER_SITE_OTHER): Payer: PPO

## 2021-12-11 DIAGNOSIS — J309 Allergic rhinitis, unspecified: Secondary | ICD-10-CM

## 2021-12-18 ENCOUNTER — Ambulatory Visit (INDEPENDENT_AMBULATORY_CARE_PROVIDER_SITE_OTHER): Payer: PPO

## 2021-12-18 DIAGNOSIS — J309 Allergic rhinitis, unspecified: Secondary | ICD-10-CM

## 2021-12-26 ENCOUNTER — Ambulatory Visit (INDEPENDENT_AMBULATORY_CARE_PROVIDER_SITE_OTHER): Payer: PPO

## 2021-12-26 DIAGNOSIS — J309 Allergic rhinitis, unspecified: Secondary | ICD-10-CM | POA: Diagnosis not present

## 2021-12-30 ENCOUNTER — Ambulatory Visit (INDEPENDENT_AMBULATORY_CARE_PROVIDER_SITE_OTHER): Payer: PPO

## 2021-12-30 DIAGNOSIS — J309 Allergic rhinitis, unspecified: Secondary | ICD-10-CM

## 2022-01-12 ENCOUNTER — Ambulatory Visit (INDEPENDENT_AMBULATORY_CARE_PROVIDER_SITE_OTHER): Payer: PPO | Admitting: Emergency Medicine

## 2022-01-12 ENCOUNTER — Ambulatory Visit (INDEPENDENT_AMBULATORY_CARE_PROVIDER_SITE_OTHER): Payer: PPO

## 2022-01-12 ENCOUNTER — Encounter: Payer: Self-pay | Admitting: Emergency Medicine

## 2022-01-12 ENCOUNTER — Other Ambulatory Visit: Payer: Self-pay

## 2022-01-12 VITALS — BP 116/70 | HR 84 | Ht 61.0 in | Wt 117.0 lb

## 2022-01-12 DIAGNOSIS — Z130 Encounter for screening for diseases of the blood and blood-forming organs and certain disorders involving the immune mechanism: Secondary | ICD-10-CM | POA: Diagnosis not present

## 2022-01-12 DIAGNOSIS — Z0000 Encounter for general adult medical examination without abnormal findings: Secondary | ICD-10-CM

## 2022-01-12 DIAGNOSIS — Z13228 Encounter for screening for other metabolic disorders: Secondary | ICD-10-CM | POA: Diagnosis not present

## 2022-01-12 DIAGNOSIS — Z13220 Encounter for screening for lipoid disorders: Secondary | ICD-10-CM

## 2022-01-12 DIAGNOSIS — J309 Allergic rhinitis, unspecified: Secondary | ICD-10-CM

## 2022-01-12 DIAGNOSIS — Z1329 Encounter for screening for other suspected endocrine disorder: Secondary | ICD-10-CM

## 2022-01-12 NOTE — Progress Notes (Signed)
Anne Mills 76 y.o.   Chief Complaint  Patient presents with   New Patient (Initial Visit)    HISTORY OF PRESENT ILLNESS: This is a 76 y.o. female known to me from [place], here for physical exam. Past medical history remarkable for chronic allergies and back chronic problems. Otherwise healthy female with a healthy lifestyle. No other complaints or medical concerns today. Up-to-date with mammogram and colonoscopies.  Last colonoscopy report from 2021 showed multiple polyps.  HPI   Prior to Admission medications   Medication Sig Start Date End Date Taking? Authorizing Provider  amoxicillin-clavulanate (AUGMENTIN) 875-125 MG tablet Take 1 tablet by mouth 2 (two) times daily. 11/06/21   Kennith Gain, MD  B Complex Vitamins (VITAMIN B-COMPLEX PO) Take by mouth. Liquid    [provider]  Calcium Carbonate-Vitamin D (CALCIUM 600 + D PO) Take 1 tablet by mouth daily.    [provider]  Camphor-Menthol-Methyl Sal 1.2-5.7-6.3 % PTCH Apply topically.    [provider]  Cholecalciferol 2000 UNITS TABS Take 1 tablet by mouth daily.    [provider]  cyclobenzaprine (FLEXERIL) 5 MG tablet Take 1 tablet (5 mg total) by mouth at bedtime. 04/26/20   Daleen Squibb, MD  EVENING PRIMROSE OIL PO Take 1,300 mg by mouth.    [provider]  gabapentin (NEURONTIN) 300 MG capsule Take 300 mg by mouth 3 (three) times daily.    [provider]  GELATIN PO Take 1,300 mg by mouth.    [provider]  loratadine (ALLERGY) 10 MG tablet     [provider]  Magnesium 500 MG CAPS Take by mouth.    [provider]  Misc Natural Products (OSTEO BI-FLEX TRIPLE STRENGTH) TABS Take 2 tablets by mouth daily.    [provider]  Multiple Vitamins-Minerals (STRESS TAB NF) TABS Take 1 tablet by mouth daily.    [provider]  OVER THE COUNTER MEDICATION Transitions w/Hesperidin vegetarian taking     [provider]  OVER THE COUNTER MEDICATION Wal-Finate Dw/Pseudoephedrine taking    [provider]  Probiotic Product (PROBIOTIC PO) Take by mouth. 10 Billion Active Cultures    [provider]  Trolamine Salicylate (ASPERCREME EX) Apply 1 application topically 2 (two) times daily as needed (for back pain).    [provider]    Allergies  Allergen Reactions   Gluten Meal Cough   Biaxin [Clarithromycin] Nausea And Vomiting    Patient Active Problem List   Diagnosis Date Noted   DDD (degenerative disc disease), cervical 06/01/2019   Degeneration of lumbar or lumbosacral intervertebral disc 06/01/2019   Asthma 12/13/2017   Gastroesophageal reflux disease 12/13/2017   Other allergic rhinitis 12/13/2017   Seasonal allergic conjunctivitis 12/13/2017   Scoliosis (and kyphoscoliosis), idiopathic 09/15/2017   Cervical stenosis (uterine cervix) 03/03/2013    Past Medical History:  Diagnosis Date   Allergy    Asthma    no inhaler   Back pain, chronic    Chest pain    under left breast- 2-3 x per week: h/o breast surgery   GERD (gastroesophageal reflux disease)    Headache(784.0)    Heart murmur    "not serious" per pt-no echo   Scoliosis    Seasonal allergies    Shortness of breath    per pt due to allergies    Past Surgical History:  Procedure Laterality Date   ABDOMINOPLASTY     BREAST SURGERY  lt cyst   COLONOSCOPY     DILATION AND CURETTAGE OF UTERUS N/A 03/03/2013   Procedure: DILATATION AND CURETTAGE;  Surgeon: Lahoma Crocker, MD;  Location: [facility];  Service: Gynecology;  Laterality: N/A;   EXAMINATION UNDER ANESTHESIA N/A 03/03/2013   Procedure: EXAM UNDER ANESTHESIA with ultrasound;  Surgeon: Lahoma Crocker, MD;  Location: [facility];  Service: Gynecology;  Laterality: N/A;   EYE SURGERY  10/21 ---10/7 /2021   catarct both eyes   TRIGGER FINGER RELEASE Left 06/21/2014   Procedure: LEFT THUMB TRIGGER RELEASE;  Surgeon:  Tennis Must, MD;  Location: [facility];  Service: Orthopedics;  Laterality: Left;    Social History   Socioeconomic History   Marital status: Married    Spouse name: Not on file   Number of children: Not on file   Years of education: Not on file   Highest education level: Not on file  Occupational History   Occupation: retired    Comment: house cleaning  Tobacco Use   Smoking status: Never   Smokeless tobacco: Never  Vaping Use   Vaping Use: Never used  Substance and Sexual Activity   Alcohol use: No   Drug use: No   Sexual activity: Yes    Birth control/protection: Post-menopausal  Other Topics Concern   Not on file  Social History Narrative   Marital status: married x 65 years; second marriage; happily married.  From [place]. Left [place] age 30; grew up [place].      Children:  2 daughters; 8 grandchildren; 63 gg.      Lives: with husband, daughter.      Employment: retired; clean homes      Tobacco; none      Alcohol: none      Drugs: none      Exercise: sporadic in 2019.  [place].      ADLs: independent with ADLs; no assistant devices.      Advanced Directives:  FULL CODE; no prolonged measures.         Social Determinants of Health   Financial Resource Strain: Not on file  Food Insecurity: Not on file  Transportation Needs: Not on file  Physical Activity: Not on file  Stress: Not on file  Social Connections: Not on file  Intimate Partner Violence: Not on file    Family History  Problem Relation Age of Onset   Dementia Mother    Hypertension Mother    Cancer Mother 95       Breast cancer   Depression Mother    Cancer Sister    Diabetes Sister    Heart disease Sister 90       AMI as cause of death   Hyperlipidemia Sister    Hypertension Sister    Mental retardation Sister    Cancer Father        prostate cancer   Heart disease Sister 51       AMI   Diabetes Sister      Review of Systems  Constitutional: Negative.   Negative for chills and fever.  HENT: Negative.  Negative for congestion and sore throat.   Respiratory: Negative.  Negative for cough and shortness of breath.   Cardiovascular: Negative.  Negative for chest pain and palpitations.  Gastrointestinal: Negative.  Negative for abdominal pain, diarrhea, nausea and vomiting.  Genitourinary: Negative.  Negative for dysuria and hematuria.  Skin: Negative.  Negative for rash.  Neurological:  Negative for dizziness and  headaches.  All other systems reviewed and are negative. Today's Vitals   01/12/22 1605  BP: 116/70  Pulse: 84  SpO2: 97%  Weight: 117 lb (53.1 kg)  Height: 5\' 1"  (1.549 m)   Body mass index is 22.11 kg/m.   Physical Exam Vitals reviewed.  Constitutional:      Appearance: Normal appearance.  HENT:     Head: Normocephalic.     Right Ear: Tympanic membrane, ear canal and external ear normal.     Left Ear: Tympanic membrane, ear canal and external ear normal.     Mouth/Throat:     Mouth: Mucous membranes are moist.     Pharynx: Oropharynx is clear.  Eyes:     Extraocular Movements: Extraocular movements intact.     Conjunctiva/sclera: Conjunctivae normal.     Pupils: Pupils are equal, round, and reactive to light.  Cardiovascular:     Rate and Rhythm: Normal rate and regular rhythm.     Pulses: Normal pulses.     Heart sounds: Normal heart sounds.  Pulmonary:     Effort: Pulmonary effort is normal.     Breath sounds: Normal breath sounds.  Abdominal:     General: There is no distension.     Palpations: Abdomen is soft.     Tenderness: There is no abdominal tenderness.  Musculoskeletal:        General: Normal range of motion.     Cervical back: Normal range of motion and neck supple. No tenderness.  Lymphadenopathy:     Cervical: No cervical adenopathy.  Skin:    General: Skin is warm and dry.     Capillary Refill: Capillary refill takes less than 2 seconds.  Neurological:     General: No focal deficit  present.     Mental Status: She is alert and oriented to person, place, and time.  Psychiatric:        Mood and Affect: Mood normal.        Behavior: Behavior normal.     ASSESSMENT & PLAN: Problem List Items Addressed This Visit   None Visit Diagnoses     Routine general medical examination at a health care facility    -  Primary   Screening for deficiency anemia       Relevant Orders   CBC with Differential   Screening for lipoid disorders       Relevant Orders   Lipid panel   Screening for endocrine, metabolic and immunity disorder       Relevant Orders   Comprehensive metabolic panel   Hemoglobin A1c   TSH      Modifiable risk factors discussed with patient. Anticipatory guidance according to age provided. The following topics were also discussed: Social Determinants of Health Smoking.  Non-smoker Diet and nutrition Benefits of exercise Cancer screening and review of most recent mammogram and colonoscopy reports Vaccinations recommendations Cardiovascular risk assessment Mental health including depression and anxiety Fall and accident prevention  Patient Instructions  Health Maintenance, Female Adopting a healthy lifestyle and getting preventive care are important in promoting health and wellness. Ask your health care provider about: The right schedule for you to have regular tests and exams. Things you can do on your own to prevent diseases and keep yourself healthy. What should I know about diet, weight, and exercise? Eat a healthy diet  Eat a diet that includes plenty of vegetables, fruits, low-fat dairy products, and lean protein. Do not eat a lot of foods that are high  in solid fats, added sugars, or sodium. Maintain a healthy weight Body mass index (BMI) is used to identify weight problems. It estimates body fat based on height and weight. Your health care provider can help determine your BMI and help you achieve or maintain a healthy weight. Get regular  exercise Get regular exercise. This is one of the most important things you can do for your health. Most adults should: Exercise for at least 150 minutes each week. The exercise should increase your heart rate and make you sweat (moderate-intensity exercise). Do strengthening exercises at least twice a week. This is in addition to the moderate-intensity exercise. Spend less time sitting. Even light physical activity can be beneficial. Watch cholesterol and blood lipids Have your blood tested for lipids and cholesterol at 75 years of age, then have this test every 5 years. Have your cholesterol levels checked more often if: Your lipid or cholesterol levels are high. You are older than 75 years of age. You are at high risk for heart disease. What should I know about cancer screening? Depending on your health history and family history, you may need to have cancer screening at various ages. This may include screening for: Breast cancer. Cervical cancer. Colorectal cancer. Skin cancer. Lung cancer. What should I know about heart disease, diabetes, and high blood pressure? Blood pressure and heart disease High blood pressure causes heart disease and increases the risk of stroke. This is more likely to develop in people who have high blood pressure readings or are overweight. Have your blood pressure checked: Every 3-5 years if you are 66-52 years of age. Every year if you are 48 years old or older. Diabetes Have regular diabetes screenings. This checks your fasting blood sugar level. Have the screening done: Once every three years after age 27 if you are at a normal weight and have a low risk for diabetes. More often and at a younger age if you are overweight or have a high risk for diabetes. What should I know about preventing infection? Hepatitis B If you have a higher risk for hepatitis B, you should be screened for this virus. Talk with your health care provider to find out if you are at  risk for hepatitis B infection. Hepatitis C Testing is recommended for: Everyone born from [birth year] through 1964. Anyone with known risk factors for hepatitis C. Sexually transmitted infections (STIs) Get screened for STIs, including gonorrhea and chlamydia, if: You are sexually active and are younger than 75 years of age. You are older than 74 years of age and your health care provider tells you that you are at risk for this type of infection. Your sexual activity has changed since you were last screened, and you are at increased risk for chlamydia or gonorrhea. Ask your health care provider if you are at risk. Ask your health care provider about whether you are at high risk for HIV. Your health care provider may recommend a prescription medicine to help prevent HIV infection. If you choose to take medicine to prevent HIV, you should first get tested for HIV. You should then be tested every 3 months for as long as you are taking the medicine. Pregnancy If you are about to stop having your period (premenopausal) and you may become pregnant, seek counseling before you get pregnant. Take 400 to 800 micrograms (mcg) of folic acid every day if you become pregnant. Ask for birth control (contraception) if you want to prevent pregnancy. Osteoporosis and menopause Osteoporosis is  a disease in which the bones lose minerals and strength with aging. This can result in bone fractures. If you are 84 years old or older, or if you are at risk for osteoporosis and fractures, ask your health care provider if you should: Be screened for bone loss. Take a calcium or vitamin D supplement to lower your risk of fractures. Be given hormone replacement therapy (HRT) to treat symptoms of menopause. Follow these instructions at home: Alcohol use Do not drink alcohol if: Your health care provider tells you not to drink. You are pregnant, may be pregnant, or are planning to become pregnant. If you drink alcohol: Limit  how much you have to: 0-1 drink a day. Know how much alcohol is in your drink. In the [place] one drink equals one 12 oz bottle of beer (355 mL), one 5 oz glass of wine (148 mL), or one 1 oz glass of hard liquor (44 mL). Lifestyle Do not use any products that contain nicotine or tobacco. These products include cigarettes, chewing tobacco, and vaping devices, such as e-cigarettes. If you need help quitting, ask your health care provider. Do not use street drugs. Do not share needles. Ask your health care provider for help if you need support or information about quitting drugs. General instructions Schedule regular health, dental, and eye exams. Stay current with your vaccines. Tell your health care provider if: You often feel depressed. You have ever been abused or do not feel safe at home. Summary Adopting a healthy lifestyle and getting preventive care are important in promoting health and wellness. Follow your health care provider's instructions about healthy diet, exercising, and getting tested or screened for diseases. Follow your health care provider's instructions on monitoring your cholesterol and blood pressure. This information is not intended to replace advice given to you by your health care provider. Make sure you discuss any questions you have with your health care provider. Document Revised: 04/14/2021 Document Reviewed: 04/14/2021 Elsevier Patient Education  2022 [place], MD [facility]

## 2022-01-12 NOTE — Patient Instructions (Signed)

## 2022-01-13 ENCOUNTER — Other Ambulatory Visit: Payer: Self-pay | Admitting: Emergency Medicine

## 2022-01-13 DIAGNOSIS — E785 Hyperlipidemia, unspecified: Secondary | ICD-10-CM | POA: Insufficient documentation

## 2022-01-13 LAB — COMPREHENSIVE METABOLIC PANEL
ALT: 21 U/L (ref 0–35)
AST: 26 U/L (ref 0–37)
Albumin: 4.3 g/dL (ref 3.5–5.2)
Alkaline Phosphatase: 92 U/L (ref 39–117)
BUN: 20 mg/dL (ref 6–23)
CO2: 33 mEq/L — ABNORMAL HIGH (ref 19–32)
Calcium: 9.5 mg/dL (ref 8.4–10.5)
Chloride: 104 mEq/L (ref 96–112)
Creatinine, Ser: 0.71 mg/dL (ref 0.40–1.20)
GFR: 83.5 mL/min (ref >60.00)
Glucose, Bld: 86 mg/dL (ref 70–99)
Potassium: 4.4 mEq/L (ref 3.5–5.1)
Sodium: 140 mEq/L (ref 135–145)
Total Bilirubin: 0.5 mg/dL (ref 0.2–1.2)
Total Protein: 7.2 g/dL (ref 6.0–8.3)

## 2022-01-13 LAB — CBC WITH DIFFERENTIAL/PLATELET
Basophils Absolute: 0.1 10*3/uL (ref 0.0–0.1)
Basophils Relative: 1.4 % (ref 0.0–3.0)
Eosinophils Absolute: 0.1 10*3/uL (ref 0.0–0.7)
Eosinophils Relative: 1.7 % (ref 0.0–5.0)
HCT: 41.5 % (ref 36.0–46.0)
Hemoglobin: 13.7 g/dL (ref 12.0–15.0)
Lymphocytes Relative: 28.4 % (ref 12.0–46.0)
Lymphs Abs: 1.6 10*3/uL (ref 0.7–4.0)
MCHC: 33 g/dL (ref 30.0–36.0)
MCV: 90 fl (ref 78.0–100.0)
Monocytes Absolute: 0.5 10*3/uL (ref 0.1–1.0)
Monocytes Relative: 8.7 % (ref 3.0–12.0)
Neutro Abs: 3.3 10*3/uL (ref 1.4–7.7)
Neutrophils Relative %: 59.8 % (ref 43.0–77.0)
Platelets: 245 10*3/uL (ref 150.0–400.0)
RBC: 4.61 Mil/uL (ref 3.87–5.11)
RDW: 13.3 % (ref 11.5–15.5)
WBC: 5.5 10*3/uL (ref 4.0–10.5)

## 2022-01-13 LAB — LIPID PANEL
Cholesterol: 207 mg/dL — ABNORMAL HIGH (ref 0–200)
HDL: 60.1 mg/dL (ref >39.00)
LDL Cholesterol: 117 mg/dL — ABNORMAL HIGH (ref 0–99)
NonHDL: 146.53
Total CHOL/HDL Ratio: 3
Triglycerides: 146 mg/dL (ref 0.0–149.0)
VLDL: 29.2 mg/dL (ref 0.0–40.0)

## 2022-01-13 LAB — HEMOGLOBIN A1C: Hgb A1c MFr Bld: 5.7 % (ref 4.6–6.5)

## 2022-01-13 LAB — TSH: TSH: 0.38 u[IU]/mL (ref 0.35–5.50)

## 2022-01-13 MED ORDER — ROSUVASTATIN CALCIUM 10 MG PO TABS: 10.0000 mg | ORAL_TABLET | Freq: Every day | ORAL | 2 refills | Status: DC

## 2022-01-13 NOTE — Progress Notes (Signed)
Abnormal lipid profile. Lab Results  Component Value Date   CHOL 207 (H) 01/12/2022   HDL 60.10 01/12/2022   LDLCALC 117 (H) 01/12/2022   TRIG 146.0 01/12/2022   CHOLHDL 3 01/12/2022   The 10-year ASCVD risk score (Arnett DK, et al., 2019) is: 12.1%   Values used to calculate the score:     Age: 75 years     Sex: Female     Is Non-Hispanic African American: No     Diabetic: No     Tobacco smoker: No     Systolic Blood Pressure: 256 mmHg     Is BP treated: No     HDL Cholesterol: 60.1 mg/dL     Total Cholesterol: 207 mg/dL Recommend to start Crestor 10 mg daily.

## 2022-01-21 ENCOUNTER — Ambulatory Visit (INDEPENDENT_AMBULATORY_CARE_PROVIDER_SITE_OTHER): Payer: PPO

## 2022-01-21 DIAGNOSIS — J309 Allergic rhinitis, unspecified: Secondary | ICD-10-CM

## 2022-01-22 ENCOUNTER — Telehealth: Payer: Self-pay | Admitting: Emergency Medicine

## 2022-01-22 NOTE — Telephone Encounter (Signed)
1.Medication Requested: gabapentin (NEURONTIN) 300 MG capsule  2. Pharmacy (Name, Street, [place]): [facility] ([place], [place]  3. On Med List: yes   4. Last Visit with PCP: 01-12-2022  5. Next visit date with PCP: n/a   Agent: Please be advised that RX refills may take up to 3 business days. We ask that you follow-up with your pharmacy.

## 2022-01-23 MED ORDER — GABAPENTIN 300 MG PO CAPS: 300.0000 mg | ORAL_CAPSULE | Freq: Three times a day (TID) | ORAL | 2 refills | Status: DC

## 2022-01-23 NOTE — Telephone Encounter (Signed)
Refilled medication

## 2022-02-04 ENCOUNTER — Ambulatory Visit (INDEPENDENT_AMBULATORY_CARE_PROVIDER_SITE_OTHER): Payer: PPO

## 2022-02-04 DIAGNOSIS — J309 Allergic rhinitis, unspecified: Secondary | ICD-10-CM

## 2022-02-10 ENCOUNTER — Encounter: Payer: Self-pay | Admitting: Emergency Medicine

## 2022-02-10 ENCOUNTER — Ambulatory Visit (INDEPENDENT_AMBULATORY_CARE_PROVIDER_SITE_OTHER): Payer: PPO | Admitting: Emergency Medicine

## 2022-02-10 ENCOUNTER — Other Ambulatory Visit: Payer: Self-pay

## 2022-02-10 VITALS — BP 110/60 | HR 88 | Temp 98.5°F | Ht 61.0 in | Wt 117.1 lb

## 2022-02-10 DIAGNOSIS — E785 Hyperlipidemia, unspecified: Secondary | ICD-10-CM

## 2022-02-10 DIAGNOSIS — Z23 Encounter for immunization: Secondary | ICD-10-CM | POA: Diagnosis not present

## 2022-02-10 DIAGNOSIS — M79675 Pain in left toe(s): Secondary | ICD-10-CM | POA: Insufficient documentation

## 2022-02-10 MED ORDER — CYCLOBENZAPRINE HCL 5 MG PO TABS: 5.0000 mg | ORAL_TABLET | Freq: Every day | ORAL | 1 refills | Status: DC

## 2022-02-10 MED ORDER — GABAPENTIN 300 MG PO CAPS: 300.0000 mg | ORAL_CAPSULE | Freq: Three times a day (TID) | ORAL | 2 refills | Status: DC

## 2022-02-10 NOTE — Patient Instructions (Signed)
Health Maintenance After Age 75 After age 75, you are at a higher risk for certain long-term diseases and infections as well as injuries from falls. Falls are a major cause of broken bones and head injuries in people who are older than age 75. Getting regular preventive care can help to keep you healthy and well. Preventive care includes getting regular testing and making lifestyle changes as recommended by your health care provider. Talk with your health care provider about: Which screenings and tests you should have. A screening is a test that checks for a disease when you have no symptoms. A diet and exercise plan that is right for you. What should I know about screenings and tests to prevent falls? Screening and testing are the best ways to find a health problem early. Early diagnosis and treatment give you the best chance of managing medical conditions that are common after age 75. Certain conditions and lifestyle choices may make you more likely to have a fall. Your health care provider may recommend: Regular vision checks. Poor vision and conditions such as cataracts can make you more likely to have a fall. If you wear glasses, make sure to get your prescription updated if your vision changes. Medicine review. Work with your health care provider to regularly review all of the medicines you are taking, including over-the-counter medicines. Ask your health care provider about any side effects that may make you more likely to have a fall. Tell your health care provider if any medicines that you take make you feel dizzy or sleepy. Strength and balance checks. Your health care provider may recommend certain tests to check your strength and balance while standing, walking, or changing positions. Foot health exam. Foot pain and numbness, as well as not wearing proper footwear, can make you more likely to have a fall. Screenings, including: Osteoporosis screening. Osteoporosis is a condition that causes  the bones to get weaker and break more easily. Blood pressure screening. Blood pressure changes and medicines to control blood pressure can make you feel dizzy. Depression screening. You may be more likely to have a fall if you have a fear of falling, feel depressed, or feel unable to do activities that you used to do. Alcohol use screening. Using too much alcohol can affect your balance and may make you more likely to have a fall. Follow these instructions at home: Lifestyle Do not drink alcohol if: Your health care provider tells you not to drink. If you drink alcohol: Limit how much you have to: 0-1 drink a day for women. 0-2 drinks a day for men. Know how much alcohol is in your drink. In the [place] one drink equals one 12 oz bottle of beer (355 mL), one 5 oz glass of wine (148 mL), or one 1 oz glass of hard liquor (44 mL). Do not use any products that contain nicotine or tobacco. These products include cigarettes, chewing tobacco, and vaping devices, such as e-cigarettes. If you need help quitting, ask your health care provider. Activity  Follow a regular exercise program to stay fit. This will help you maintain your balance. Ask your health care provider what types of exercise are appropriate for you. If you need a cane or walker, use it as recommended by your health care provider. Wear supportive shoes that have nonskid soles. Safety  Remove any tripping hazards, such as rugs, cords, and clutter. Install safety equipment such as grab bars in bathrooms and safety rails on stairs. Keep rooms and walkways   well-lit. General instructions Talk with your health care provider about your risks for falling. Tell your health care provider if: You fall. Be sure to tell your health care provider about all falls, even ones that seem minor. You feel dizzy, tiredness (fatigue), or off-balance. Take over-the-counter and prescription medicines only as told by your health care provider. These include  supplements. Eat a healthy diet and maintain a healthy weight. A healthy diet includes low-fat dairy products, low-fat (lean) meats, and fiber from whole grains, beans, and lots of fruits and vegetables. Stay current with your vaccines. Schedule regular health, dental, and eye exams. Summary Having a healthy lifestyle and getting preventive care can help to protect your health and wellness after age 75. Screening and testing are the best way to find a health problem early and help you avoid having a fall. Early diagnosis and treatment give you the best chance for managing medical conditions that are more common for people who are older than age 75. Falls are a major cause of broken bones and head injuries in people who are older than age 75. Take precautions to prevent a fall at home. Work with your health care provider to learn what changes you can make to improve your health and wellness and to prevent falls. This information is not intended to replace advice given to you by your health care provider. Make sure you discuss any questions you have with your health care provider. Document Revised: 04/14/2021 Document Reviewed: 04/14/2021 Elsevier Patient Education  2022 Elsevier Inc.  

## 2022-02-10 NOTE — Progress Notes (Signed)
Anne Mills 76 y.o.   Chief Complaint  Patient presents with   Toe Pain    Left foot toe pain / big toe    Medication Refill    HISTORY OF PRESENT ILLNESS: This is a 76 y.o. female complaining of pain to left big toe which is much better today. Has been coming and going for the past several months.  No history of gout. May be related to shoes she was wearing.  Much better today.  Denies injuries. Also needs medication refills for gabapentin and Flexeril. No other complaints or medical concerns today.  Toe Pain   Medication Refill Pertinent negatives include no abdominal pain, chest pain, chills, congestion, coughing, fever, headaches, nausea, rash, sore throat or vomiting.    Prior to Admission medications   Medication Sig Start Date End Date Taking? Authorizing Provider  B Complex Vitamins (VITAMIN B-COMPLEX PO) Take by mouth. Liquid   Yes [provider]  Calcium Carbonate-Vitamin D (CALCIUM 600 + D PO) Take 1 tablet by mouth daily.   Yes [provider]  Camphor-Menthol-Methyl Sal 1.2-5.7-6.3 % PTCH Apply topically.   Yes [provider]  Cholecalciferol 2000 UNITS TABS Take 1 tablet by mouth daily.   Yes [provider]  EVENING PRIMROSE OIL PO Take 1,300 mg by mouth.   Yes [provider]  GELATIN PO Take 1,300 mg by mouth.   Yes [provider]  loratadine (CLARITIN) 10 MG tablet    Yes [provider]  Magnesium 500 MG CAPS Take by mouth.   Yes [provider]  Misc Natural Products (OSTEO BI-FLEX TRIPLE STRENGTH) TABS Take 2 tablets by mouth daily.   Yes [provider]  Multiple Vitamins-Minerals (STRESS TAB NF) TABS Take 1 tablet by mouth daily.   Yes [provider]  OVER THE COUNTER MEDICATION Transitions w/Hesperidin vegetarian taking   Yes [provider]  OVER THE COUNTER MEDICATION Wal-Finate Dw/Pseudoephedrine taking   Yes [provider]  Probiotic  Product (PROBIOTIC PO) Take by mouth. 10 Billion Active Cultures   Yes [provider]  rosuvastatin (CRESTOR) 10 MG tablet Take 1 tablet (10 mg total) by mouth daily. 01/13/22  Yes Ned Kakar, Eilleen Kempf, MD  Trolamine Salicylate (ASPERCREME EX) Apply 1 application topically 2 (two) times daily as needed (for back pain).   Yes [provider]  cyclobenzaprine (FLEXERIL) 5 MG tablet Take 1 tablet (5 mg total) by mouth at bedtime. 02/10/22   Georgina Quint, MD  gabapentin (NEURONTIN) 300 MG capsule Take 1 capsule (300 mg total) by mouth 3 (three) times daily. 02/10/22   Georgina Quint, MD    Allergies  Allergen Reactions   Gluten Meal Cough   Biaxin [Clarithromycin] Nausea And Vomiting    Patient Active Problem List   Diagnosis Date Noted   Dyslipidemia 01/13/2022   DDD (degenerative disc disease), cervical 06/01/2019   Degeneration of lumbar or lumbosacral intervertebral disc 06/01/2019   Asthma 12/13/2017   Gastroesophageal reflux disease 12/13/2017   Seasonal allergic conjunctivitis 12/13/2017   Scoliosis (and kyphoscoliosis), idiopathic 09/15/2017   Cervical stenosis (uterine cervix) 03/03/2013    Past Medical History:  Diagnosis Date   Allergy    Asthma    no inhaler   Back pain, chronic    Chest pain    under left breast- 2-3 x per week: h/o breast surgery   GERD (gastroesophageal reflux disease)    Headache(784.0)    Heart murmur    "not  serious" per pt-no echo   Scoliosis    Seasonal allergies    Shortness of breath    per pt due to allergies    Past Surgical History:  Procedure Laterality Date   ABDOMINOPLASTY     BREAST SURGERY     lt cyst   COLONOSCOPY     DILATION AND CURETTAGE OF UTERUS N/A 03/03/2013   Procedure: DILATATION AND CURETTAGE;  Surgeon: Antionette Char, MD;  Location: [facility];  Service: Gynecology;  Laterality: N/A;   EXAMINATION UNDER ANESTHESIA N/A 03/03/2013   Procedure: EXAM UNDER ANESTHESIA with ultrasound;   Surgeon: Antionette Char, MD;  Location: [facility];  Service: Gynecology;  Laterality: N/A;   EYE SURGERY  10/21 ---10/7 /2021   catarct both eyes   TRIGGER FINGER RELEASE Left 06/21/2014   Procedure: LEFT THUMB TRIGGER RELEASE;  Surgeon: Tami Ribas, MD;  Location: [facility];  Service: Orthopedics;  Laterality: Left;    Social History   Socioeconomic History   Marital status: Married    Spouse name: Not on file   Number of children: Not on file   Years of education: Not on file   Highest education level: Not on file  Occupational History   Occupation: retired    Comment: house cleaning  Tobacco Use   Smoking status: Never   Smokeless tobacco: Never  Vaping Use   Vaping Use: Never used  Substance and Sexual Activity   Alcohol use: No   Drug use: No   Sexual activity: Yes    Birth control/protection: Post-menopausal  Other Topics Concern   Not on file  Social History Narrative   Marital status: married x 41 years; second marriage; happily married.  From [place] Left [place] 34; grew up [place].      Children:  2 daughters; 8 grandchildren; 10 gg.      Lives: with husband, daughter.      Employment: retired; clean homes      Tobacco; none      Alcohol: none      Drugs: none      Exercise: sporadic in 2019.  Joined [employer].      ADLs: independent with ADLs; no assistant devices.      Advanced Directives:  FULL CODE; no prolonged measures.         Social Determinants of Health   Financial Resource Strain: Not on file  Food Insecurity: Not on file  Transportation Needs: Not on file  Physical Activity: Not on file  Stress: Not on file  Social Connections: Not on file  Intimate Partner Violence: Not on file    Family History  Problem Relation Age of Onset   Dementia Mother    Hypertension Mother    Cancer Mother 7       Breast cancer   Depression Mother    Cancer Sister    Diabetes Sister    Heart disease Sister 12       AMI as cause of  death   Hyperlipidemia Sister    Hypertension Sister    Mental retardation Sister    Cancer Father        prostate cancer   Heart disease Sister 76       AMI   Diabetes Sister      Review of Systems  Constitutional: Negative.  Negative for chills and fever.  HENT: Negative.  Negative for congestion and sore throat.   Respiratory: Negative.  Negative for cough and  shortness of breath.   Cardiovascular:  Negative for chest pain and palpitations.  Gastrointestinal:  Negative for abdominal pain, diarrhea, nausea and vomiting.  Genitourinary: Negative.   Skin: Negative.  Negative for rash.  Neurological:  Negative for dizziness and headaches.  All other systems reviewed and are negative. Today's Vitals   02/10/22 1332  BP: 110/60  Pulse: 88  Temp: 98.5 F (36.9 C)  TempSrc: Oral  SpO2: 93%  Weight: 117 lb 2 oz (53.1 kg)  Height: 5\' 1"  (1.549 m)   Body mass index is 22.13 kg/m.   Physical Exam Vitals reviewed.  Constitutional:      Appearance: Normal appearance.  HENT:     Head: Normocephalic.  Cardiovascular:     Rate and Rhythm: Normal rate.  Pulmonary:     Effort: Pulmonary effort is normal.  Musculoskeletal:     Comments: Feet: Warm to touch.  No erythema or swelling.  Neurovascularly intact.  Good distal circulation.  Excellent capillary refill.  No findings of gout.  Skin:    General: Skin is warm and dry.  Neurological:     General: No focal deficit present.     Mental Status: She is alert and oriented to person, place, and time.  Psychiatric:        Mood and Affect: Mood normal.        Behavior: Behavior normal.    ASSESSMENT & PLAN: Problem List Items Addressed This Visit       Other   Dyslipidemia    Stable.  Diet and nutrition discussed.  Continue rosuvastatin 10 mg daily. The 10-year ASCVD risk score (Arnett DK, et al., 2019) is: 11%   Values used to calculate the score:     Age: 76 years     Sex: Female     Is Non-Hispanic African American:  No     Diabetic: No     Tobacco smoker: No     Systolic Blood Pressure: 110 mmHg     Is BP treated: No     HDL Cholesterol: 60.1 mg/dL     Total Cholesterol: 207 mg/dL       Pain of toe of left foot - Primary    Differential diagnosis discussed.  Gout unlikely. Much improved.  Advised to take Tylenol and or Advil as needed. Proper shoe size and support encouraged.      Other Visit Diagnoses     Need for vaccination       Relevant Orders   Varicella-zoster vaccine IM (Shingrix) (Completed)      Patient Instructions  Health Maintenance After Age 66 After age 52, you are at a higher risk for certain long-term diseases and infections as well as injuries from falls. Falls are a major cause of broken bones and head injuries in people who are older than age 31. Getting regular preventive care can help to keep you healthy and well. Preventive care includes getting regular testing and making lifestyle changes as recommended by your health care provider. Talk with your health care provider about: Which screenings and tests you should have. A screening is a test that checks for a disease when you have no symptoms. A diet and exercise plan that is right for you. What should I know about screenings and tests to prevent falls? Screening and testing are the best ways to find a health problem early. Early diagnosis and treatment give you the best chance of managing medical conditions that are common after age 21. Certain conditions  and lifestyle choices may make you more likely to have a fall. Your health care provider may recommend: Regular vision checks. Poor vision and conditions such as cataracts can make you more likely to have a fall. If you wear glasses, make sure to get your prescription updated if your vision changes. Medicine review. Work with your health care provider to regularly review all of the medicines you are taking, including over-the-counter medicines. Ask your health care  provider about any side effects that may make you more likely to have a fall. Tell your health care provider if any medicines that you take make you feel dizzy or sleepy. Strength and balance checks. Your health care provider may recommend certain tests to check your strength and balance while standing, walking, or changing positions. Foot health exam. Foot pain and numbness, as well as not wearing proper footwear, can make you more likely to have a fall. Screenings, including: Osteoporosis screening. Osteoporosis is a condition that causes the bones to get weaker and break more easily. Blood pressure screening. Blood pressure changes and medicines to control blood pressure can make you feel dizzy. Depression screening. You may be more likely to have a fall if you have a fear of falling, feel depressed, or feel unable to do activities that you used to do. Alcohol use screening. Using too much alcohol can affect your balance and may make you more likely to have a fall. Follow these instructions at home: Lifestyle Do not drink alcohol if: Your health care provider tells you not to drink. If you drink alcohol: Limit how much you have to: 0-1 drink a day for women. 0-2 drinks a day for men. Know how much alcohol is in your drink. In the [place] one drink equals one 12 oz bottle of beer (355 mL), one 5 oz glass of wine (148 mL), or one 1 oz glass of hard liquor (44 mL). Do not use any products that contain nicotine or tobacco. These products include cigarettes, chewing tobacco, and vaping devices, such as e-cigarettes. If you need help quitting, ask your health care provider. Activity  Follow a regular exercise program to stay fit. This will help you maintain your balance. Ask your health care provider what types of exercise are appropriate for you. If you need a cane or walker, use it as recommended by your health care provider. Wear supportive shoes that have nonskid soles. Safety  Remove any  tripping hazards, such as rugs, cords, and clutter. Install safety equipment such as grab bars in bathrooms and safety rails on stairs. Keep rooms and walkways well-lit. General instructions Talk with your health care provider about your risks for falling. Tell your health care provider if: You fall. Be sure to tell your health care provider about all falls, even ones that seem minor. You feel dizzy, tiredness (fatigue), or off-balance. Take over-the-counter and prescription medicines only as told by your health care provider. These include supplements. Eat a healthy diet and maintain a healthy weight. A healthy diet includes low-fat dairy products, low-fat (lean) meats, and fiber from whole grains, beans, and lots of fruits and vegetables. Stay current with your vaccines. Schedule regular health, dental, and eye exams. Summary Having a healthy lifestyle and getting preventive care can help to protect your health and wellness after age 73. Screening and testing are the best way to find a health problem early and help you avoid having a fall. Early diagnosis and treatment give you the best chance for managing medical conditions  that are more common for people who are older than age 76. Falls are a major cause of broken bones and head injuries in people who are older than age 106. Take precautions to prevent a fall at home. Work with your health care provider to learn what changes you can make to improve your health and wellness and to prevent falls. This information is not intended to replace advice given to you by your health care provider. Make sure you discuss any questions you have with your health care provider. Document Revised: 04/14/2021 Document Reviewed: 04/14/2021 Elsevier Patient Education  2022 Elsevier Inc.     Edwina Barth, MD [facility]

## 2022-02-10 NOTE — Assessment & Plan Note (Signed)
Differential diagnosis discussed.  Gout unlikely. ?Much improved.  Advised to take Tylenol and or Advil as needed. ?Proper shoe size and support encouraged. ?

## 2022-02-10 NOTE — Assessment & Plan Note (Signed)
Stable.  Diet and nutrition discussed.  Continue rosuvastatin 10 mg daily. ?The 10-year ASCVD risk score (Arnett DK, et al., 2019) is: 11% ?  Values used to calculate the score: ?    Age: 76 years ?    Sex: Female ?    Is Non-Hispanic African American: No ?    Diabetic: No ?    Tobacco smoker: No ?    Systolic Blood Pressure: 102 mmHg ?    Is BP treated: No ?    HDL Cholesterol: 60.1 mg/dL ?    Total Cholesterol: 207 mg/dL ? ?

## 2022-02-11 DIAGNOSIS — Z1231 Encounter for screening mammogram for malignant neoplasm of breast: Secondary | ICD-10-CM | POA: Diagnosis not present

## 2022-02-11 LAB — HM MAMMOGRAPHY

## 2022-02-18 ENCOUNTER — Other Ambulatory Visit (HOSPITAL_COMMUNITY): Payer: Self-pay

## 2022-02-18 ENCOUNTER — Telehealth: Payer: Self-pay

## 2022-02-18 MED ORDER — CYCLOBENZAPRINE HCL 5 MG PO TABS
5.0000 mg | ORAL_TABLET | Freq: Every evening | ORAL | 0 refills | Status: AC
  Filled 2022-02-18: qty 90, 90d supply, fill #0

## 2022-02-18 NOTE — Telephone Encounter (Signed)
[facility] called stating the pt is adamant about her Gabapentin is suppose to be 4x's a day and not 3. Please verify with PCP and send in updated rx from 3x a day to 4x a day. ?

## 2022-02-19 ENCOUNTER — Ambulatory Visit (INDEPENDENT_AMBULATORY_CARE_PROVIDER_SITE_OTHER): Payer: PPO

## 2022-02-19 ENCOUNTER — Other Ambulatory Visit (HOSPITAL_COMMUNITY): Payer: Self-pay

## 2022-02-19 DIAGNOSIS — J309 Allergic rhinitis, unspecified: Secondary | ICD-10-CM

## 2022-02-19 NOTE — Telephone Encounter (Signed)
Pt states that she takes Gabapentin 4 times a day, but the sig states that she takes it 3 times a day. After reviewing last two OV notes, I did not see where that change has been made. Is it ok to update prescription to 4 times a day. ?

## 2022-02-19 NOTE — Telephone Encounter (Signed)
Yes, it is ok. Thanks.

## 2022-02-20 ENCOUNTER — Other Ambulatory Visit (HOSPITAL_COMMUNITY): Payer: Self-pay

## 2022-02-20 MED ORDER — GABAPENTIN 300 MG PO CAPS
300.0000 mg | ORAL_CAPSULE | Freq: Four times a day (QID) | ORAL | 2 refills | Status: DC
  Filled 2022-02-20: qty 90, 23d supply, fill #0
  Filled 2022-08-18: qty 90, 23d supply, fill #1

## 2022-02-20 NOTE — Telephone Encounter (Signed)
Changed medication sig to 4 times a day and reordered medication to [facility]. ?

## 2022-02-20 NOTE — Addendum Note (Signed)
Addended by: Durwin Nora on: 02/20/2022 09:21 AM ? ? Modules accepted: Orders ? ?

## 2022-02-27 ENCOUNTER — Encounter: Payer: Self-pay | Admitting: Emergency Medicine

## 2022-03-04 ENCOUNTER — Ambulatory Visit (INDEPENDENT_AMBULATORY_CARE_PROVIDER_SITE_OTHER): Payer: PPO

## 2022-03-04 DIAGNOSIS — J309 Allergic rhinitis, unspecified: Secondary | ICD-10-CM | POA: Diagnosis not present

## 2022-03-10 DIAGNOSIS — J302 Other seasonal allergic rhinitis: Secondary | ICD-10-CM | POA: Diagnosis not present

## 2022-03-10 NOTE — Progress Notes (Signed)
VIAL EXP 03-11-23 ?

## 2022-03-17 ENCOUNTER — Ambulatory Visit: Payer: PPO | Admitting: Allergy and Immunology

## 2022-03-17 ENCOUNTER — Ambulatory Visit: Payer: Self-pay

## 2022-03-17 VITALS — BP 110/70 | HR 79 | Temp 98.1°F | Resp 18 | Ht 61.0 in | Wt 118.0 lb

## 2022-03-17 DIAGNOSIS — J309 Allergic rhinitis, unspecified: Secondary | ICD-10-CM

## 2022-03-17 DIAGNOSIS — H04123 Dry eye syndrome of bilateral lacrimal glands: Secondary | ICD-10-CM

## 2022-03-17 DIAGNOSIS — J3089 Other allergic rhinitis: Secondary | ICD-10-CM

## 2022-03-17 NOTE — Patient Instructions (Addendum)
?  1.  Continue immunotherapy   ? ?2.  Use OTC Flonase -1 spray each nostril 1-7 times per week if needed ? ?3. Continue Loratadine 10 mg - 1 tablet 1 time per day if needed (dry eye???) ? ?4.  Return to clinic in 12 months or earlier if problem ? ?

## 2022-03-17 NOTE — Progress Notes (Signed)
? ?[place] ? ? ?Follow-up Note ? ?Referring Provider: No ref. provider found ?Primary Provider: Horald Pollen, MD ?Date of Office Visit: 03/17/2022 ? ?Subjective:  ? ?Anne Mills (DOB: [birth year]) is a 76 y.o. female who returns to the [facility] on 03/17/2022 in re-evaluation of the following: ? ?HPI: Anne Mills returns to this clinic in reevaluation of allergic rhinitis.  Her last visit to this clinic with Dr. Nelva Bush was 06 November 2021 at which point time she had a episode of sinusitis treated with an antibiotic. ? ?She continues to do very well while utilizing immunotherapy currently at every 2-week administration without any adverse effect.  She has not had the need to use a systemic steroid or an antibiotic for any type of airway issue.  She intermittently uses some Flonase and some loratadine.  Overall she is very pleased with the response that she is receiving on her current therapy. ? ?She does have dry eye syndrome and is using some lubricating eyedrops which appear to be working quite well. ? ?She has received 5 COVID vaccines. ? ?Allergies as of 03/17/2022   ? ?   Reactions  ? Gluten Meal Cough  ? Biaxin [clarithromycin] Nausea And Vomiting  ? ?  ? ?  ?Medication List  ? ? ?ASPERCREME EX ?Apply 1 application topically 2 (two) times daily as needed (for back pain). ?  ?CALCIUM 600 + D PO ?Take 1 tablet by mouth daily. ?  ?Camphor-Menthol-Methyl Sal 1.2-5.7-6.3 % Ptch ?Apply topically. ?  ?Cholecalciferol 50 MCG (2000 UT) Tabs ?Take 1 tablet by mouth daily. ?  ?cyclobenzaprine 5 MG tablet ?Commonly known as: FLEXERIL ?Take 1 tablet (5 mg total) by mouth at bedtime. ?  ?cyclobenzaprine 5 MG tablet ?Commonly known as: FLEXERIL ?Take 1 tablet (5 mg total) by mouth at bedtime. ?  ?EVENING PRIMROSE OIL PO ?Take 1,300 mg by mouth. ?  ?gabapentin 300 MG capsule ?Commonly known as: NEURONTIN ?Take 1 capsule (300 mg total) by mouth 4 (four)  times daily. ?  ?GELATIN PO ?Take 1,300 mg by mouth. ?  ?loratadine 10 MG tablet ?Commonly known as: CLARITIN ?  ?Magnesium 500 MG Caps ?Take by mouth. ?  ?Osteo Bi-Flex Triple Strength Tabs ?Take 2 tablets by mouth daily. ?  ?OVER THE COUNTER MEDICATION ?Transitions w/Hesperidin vegetarian taking ?  ?OVER THE COUNTER MEDICATION ?Wal-Finate Dw/Pseudoephedrine taking ?  ?PROBIOTIC PO ?Take by mouth. 10 Billion Active Cultures ?  ?rosuvastatin 10 MG tablet ?Commonly known as: Crestor ?Take 1 tablet (10 mg total) by mouth daily. ?  ?Stress Tab NF Tabs ?Take 1 tablet by mouth daily. ?  ?VITAMIN B-COMPLEX PO ?Take by mouth. Liquid ?  ? ?Past Medical History:  ?Diagnosis Date  ? Allergy   ? Asthma   ? no inhaler  ? Back pain, chronic   ? Chest pain   ? under left breast- 2-3 x per week: h/o breast surgery  ? GERD (gastroesophageal reflux disease)   ? Headache(784.0)   ? Heart murmur   ? "not serious" per pt-no echo  ? Scoliosis   ? Seasonal allergies   ? Shortness of breath   ? per pt due to allergies  ? ? ?Past Surgical History:  ?Procedure Laterality Date  ? ABDOMINOPLASTY    ? BREAST SURGERY    ? lt cyst  ? COLONOSCOPY    ? DILATION AND CURETTAGE OF UTERUS N/A 03/03/2013  ? Procedure: DILATATION AND  CURETTAGE;  Surgeon: Lahoma Crocker, MD;  Location: [facility];  Service: Gynecology;  Laterality: N/A;  ? EXAMINATION UNDER ANESTHESIA N/A 03/03/2013  ? Procedure: EXAM UNDER ANESTHESIA with ultrasound;  Surgeon: Lahoma Crocker, MD;  Location: [facility];  Service: Gynecology;  Laterality: N/A;  ? EYE SURGERY  10/21 ---10/7 /2021  ? catarct both eyes  ? TRIGGER FINGER RELEASE Left 06/21/2014  ? Procedure: LEFT THUMB TRIGGER RELEASE;  Surgeon: Tennis Must, MD;  Location: [facility];  Service: Orthopedics;  Laterality: Left;  ? ? ?Review of systems negative except as noted in HPI / PMHx or noted below: ? ?Review of Systems  ?Constitutional: Negative.   ?HENT: Negative.    ?Eyes: Negative.   ?Respiratory:  Negative.    ?Cardiovascular: Negative.   ?Gastrointestinal: Negative.   ?Genitourinary: Negative.   ?Musculoskeletal: Negative.   ?Skin: Negative.   ?Neurological: Negative.   ?Endo/Heme/Allergies: Negative.   ?Psychiatric/Behavioral: Negative.    ? ? ?Objective:  ? ?Vitals:  ? 03/17/22 1123  ?BP: 110/70  ?Pulse: 79  ?Resp: 18  ?Temp: 98.1 ?F (36.7 ?C)  ?SpO2: 98%  ? ?Height: '5\' 1"'$  (154.9 cm)  ?Weight: 118 lb (53.5 kg)  ? ?Physical Exam ?Constitutional:   ?   Appearance: She is not diaphoretic.  ?HENT:  ?   Head: Normocephalic.  ?   Right Ear: Tympanic membrane, ear canal and external ear normal.  ?   Left Ear: Tympanic membrane, ear canal and external ear normal.  ?   Nose: Nose normal. No mucosal edema or rhinorrhea.  ?   Mouth/Throat:  ?   Pharynx: Uvula midline. No oropharyngeal exudate.  ?Eyes:  ?   Conjunctiva/sclera: Conjunctivae normal.  ?Neck:  ?   Thyroid: No thyromegaly.  ?   Trachea: Trachea normal. No tracheal tenderness or tracheal deviation.  ?Cardiovascular:  ?   Rate and Rhythm: Normal rate and regular rhythm.  ?   Heart sounds: Normal heart sounds, S1 normal and S2 normal. No murmur heard. ?Pulmonary:  ?   Effort: No respiratory distress.  ?   Breath sounds: Normal breath sounds. No stridor. No wheezing or rales.  ?Lymphadenopathy:  ?   Head:  ?   Right side of head: No tonsillar adenopathy.  ?   Left side of head: No tonsillar adenopathy.  ?   Cervical: No cervical adenopathy.  ?Skin: ?   Findings: No erythema or rash.  ?   Nails: There is no clubbing.  ?Neurological:  ?   Mental Status: She is alert.  ? ? ?Diagnostics: none ? ?Assessment and Plan:  ? ?1. Perennial allergic rhinitis   ?2. Dry eye syndrome of both eyes   ? ?1.  Continue immunotherapy   ? ?2.  Use OTC Flonase -1 spray each nostril 1-7 times per week if needed ? ?3. Continue Loratadine 10 mg - 1 tablet 1 time per day if needed (dry eye???) ? ?4.  Return to clinic in 12 months or earlier if problem ? ?Overall Anne Mills is really doing very  well on her current therapy and she will continue to use immunotherapy as her major controller treatment for her atopic disease and she has the option of using other medications should they be required as noted above.  She does have a history of dry eye and I did caution her about using an antihistamine in the setting of that condition.  I will see her back in this clinic in 1 year or earlier if  there is a problem. ? ?Allena Katz, MD ?Allergy / Immunology ?[facility] ?

## 2022-03-18 ENCOUNTER — Encounter: Payer: Self-pay | Admitting: Allergy and Immunology

## 2022-03-25 DIAGNOSIS — M25531 Pain in right wrist: Secondary | ICD-10-CM | POA: Diagnosis not present

## 2022-03-25 DIAGNOSIS — M654 Radial styloid tenosynovitis [de Quervain]: Secondary | ICD-10-CM | POA: Diagnosis not present

## 2022-04-02 ENCOUNTER — Ambulatory Visit (INDEPENDENT_AMBULATORY_CARE_PROVIDER_SITE_OTHER): Payer: PPO

## 2022-04-02 DIAGNOSIS — J309 Allergic rhinitis, unspecified: Secondary | ICD-10-CM | POA: Diagnosis not present

## 2022-04-10 ENCOUNTER — Other Ambulatory Visit: Payer: Self-pay | Admitting: Emergency Medicine

## 2022-04-10 DIAGNOSIS — E785 Hyperlipidemia, unspecified: Secondary | ICD-10-CM

## 2022-04-14 ENCOUNTER — Ambulatory Visit (INDEPENDENT_AMBULATORY_CARE_PROVIDER_SITE_OTHER): Payer: PPO

## 2022-04-14 DIAGNOSIS — J309 Allergic rhinitis, unspecified: Secondary | ICD-10-CM | POA: Diagnosis not present

## 2022-04-16 ENCOUNTER — Telehealth: Payer: Self-pay | Admitting: Emergency Medicine

## 2022-04-16 NOTE — Telephone Encounter (Signed)
Left message for patient to call back to schedule Medicare Annual Wellness Visit   Last AWV  01/10/20  Please schedule at anytime with [facility]-Nurse Health Advisor if patient calls the office back.     Any questions, please call me at [phone] 

## 2022-05-06 ENCOUNTER — Ambulatory Visit (INDEPENDENT_AMBULATORY_CARE_PROVIDER_SITE_OTHER): Payer: PPO

## 2022-05-06 DIAGNOSIS — J309 Allergic rhinitis, unspecified: Secondary | ICD-10-CM

## 2022-05-13 DIAGNOSIS — H04123 Dry eye syndrome of bilateral lacrimal glands: Secondary | ICD-10-CM | POA: Diagnosis not present

## 2022-05-13 DIAGNOSIS — J309 Allergic rhinitis, unspecified: Secondary | ICD-10-CM | POA: Diagnosis not present

## 2022-05-13 DIAGNOSIS — G43909 Migraine, unspecified, not intractable, without status migrainosus: Secondary | ICD-10-CM | POA: Diagnosis not present

## 2022-05-13 DIAGNOSIS — B379 Candidiasis, unspecified: Secondary | ICD-10-CM | POA: Diagnosis not present

## 2022-05-13 DIAGNOSIS — E782 Mixed hyperlipidemia: Secondary | ICD-10-CM | POA: Diagnosis not present

## 2022-05-13 DIAGNOSIS — F411 Generalized anxiety disorder: Secondary | ICD-10-CM | POA: Diagnosis not present

## 2022-05-13 DIAGNOSIS — G8929 Other chronic pain: Secondary | ICD-10-CM | POA: Diagnosis not present

## 2022-05-13 DIAGNOSIS — K219 Gastro-esophageal reflux disease without esophagitis: Secondary | ICD-10-CM | POA: Diagnosis not present

## 2022-05-13 DIAGNOSIS — I7300 Raynaud's syndrome without gangrene: Secondary | ICD-10-CM | POA: Diagnosis not present

## 2022-05-13 DIAGNOSIS — G629 Polyneuropathy, unspecified: Secondary | ICD-10-CM | POA: Diagnosis not present

## 2022-05-13 DIAGNOSIS — J45909 Unspecified asthma, uncomplicated: Secondary | ICD-10-CM | POA: Diagnosis not present

## 2022-05-13 DIAGNOSIS — E559 Vitamin D deficiency, unspecified: Secondary | ICD-10-CM | POA: Diagnosis not present

## 2022-05-14 ENCOUNTER — Ambulatory Visit (INDEPENDENT_AMBULATORY_CARE_PROVIDER_SITE_OTHER): Payer: PPO

## 2022-05-14 DIAGNOSIS — J309 Allergic rhinitis, unspecified: Secondary | ICD-10-CM

## 2022-05-20 ENCOUNTER — Ambulatory Visit: Payer: PPO | Admitting: Dermatology

## 2022-05-20 ENCOUNTER — Ambulatory Visit (INDEPENDENT_AMBULATORY_CARE_PROVIDER_SITE_OTHER): Payer: PPO

## 2022-05-20 ENCOUNTER — Encounter: Payer: Self-pay | Admitting: Dermatology

## 2022-05-20 DIAGNOSIS — L219 Seborrheic dermatitis, unspecified: Secondary | ICD-10-CM | POA: Diagnosis not present

## 2022-05-20 DIAGNOSIS — J309 Allergic rhinitis, unspecified: Secondary | ICD-10-CM | POA: Diagnosis not present

## 2022-05-20 DIAGNOSIS — Z1283 Encounter for screening for malignant neoplasm of skin: Secondary | ICD-10-CM

## 2022-05-20 MED ORDER — CLOBETASOL PROPIONATE 0.05 % EX FOAM: Freq: Two times a day (BID) | CUTANEOUS | 0 refills | Status: DC

## 2022-05-20 NOTE — Patient Instructions (Signed)
Pramoxine itch relief.

## 2022-05-28 ENCOUNTER — Ambulatory Visit (INDEPENDENT_AMBULATORY_CARE_PROVIDER_SITE_OTHER): Payer: PPO

## 2022-05-28 DIAGNOSIS — J309 Allergic rhinitis, unspecified: Secondary | ICD-10-CM

## 2022-06-10 ENCOUNTER — Ambulatory Visit (INDEPENDENT_AMBULATORY_CARE_PROVIDER_SITE_OTHER): Payer: PPO

## 2022-06-10 ENCOUNTER — Other Ambulatory Visit: Payer: Self-pay | Admitting: Dermatology

## 2022-06-10 DIAGNOSIS — J309 Allergic rhinitis, unspecified: Secondary | ICD-10-CM

## 2022-06-10 DIAGNOSIS — L219 Seborrheic dermatitis, unspecified: Secondary | ICD-10-CM

## 2022-06-12 ENCOUNTER — Encounter: Payer: Self-pay | Admitting: Dermatology

## 2022-06-12 NOTE — Progress Notes (Signed)
   New Patient   Subjective  Anne Mills is a 76 y.o. female who presents for the following: Annual Exam (Scalp itches x many years tx ketoconazole 2011).  Itchy scaling on scalp, ketoconazole of some benefit.  Next months. Location:  Duration:  Quality:  Associated Signs/Symptoms: Modifying Factors:  Severity:  Timing: Context:    The following portions of the chart were reviewed this encounter and updated as appropriate:  Tobacco  Allergies  Meds  Problems  Med Hx  Surg Hx  Fam Hx      Objective  Well appearing patient in no apparent distress; mood and affect are within normal limits. Abbreviated general skin examination (sun exposed plus back): No atypical pigmented lesions, no nonmelanoma skin cancer.  Scalp Inflammation and scale on scalp.  Examination showed scalp seborrheic dermatitis without involvement of ears or face.  No elbow or nail changes.    All sun exposed areas plus back examined.   Assessment & Plan  Seborrheic dermatitis Scalp  Clobetasol foam for 1 to 2 weeks as needed flares, taper use when doing well.  Avoid use on face and body folds.  clobetasol (OLUX) 0.05 % topical foam - Scalp Apply topically 2 (two) times daily.  Encounter for screening for malignant neoplasm of skin  Urged to self examine twice annually, optional annual

## 2022-06-24 ENCOUNTER — Ambulatory Visit (INDEPENDENT_AMBULATORY_CARE_PROVIDER_SITE_OTHER): Payer: PPO | Admitting: *Deleted

## 2022-06-24 DIAGNOSIS — J309 Allergic rhinitis, unspecified: Secondary | ICD-10-CM | POA: Diagnosis not present

## 2022-06-25 ENCOUNTER — Telehealth: Payer: Self-pay | Admitting: *Deleted

## 2022-06-25 NOTE — Telephone Encounter (Signed)
Patient called and stated that this morning when she woke up she was having dizziness and nausea and did throw up and felt better afterwards but she is still feeling dizzy. She states that she does not have any rash or itching at the injection site of her allergy shot which she received yesterday at .41m of her red vial. Please advise and send to [employer]

## 2022-06-29 NOTE — Telephone Encounter (Signed)
Called and left a voicemail asking for patient to return call to discuss.  °

## 2022-06-29 NOTE — Telephone Encounter (Signed)
Please inform Anne Mills that I just saw this message as I was not in the clinic on Thursday or Friday.  Hopefully she is feeling better.  Is this a continuous problem or did it resolved?  It does not sound as though this is related in time to the use of her injections and probably not an issue related to her injections.

## 2022-07-07 NOTE — Telephone Encounter (Signed)
Called patient - LMOVM to contact office regarding 06/25/22 Sxs she was having - have they resolved???

## 2022-07-09 ENCOUNTER — Ambulatory Visit (INDEPENDENT_AMBULATORY_CARE_PROVIDER_SITE_OTHER): Payer: PPO

## 2022-07-09 DIAGNOSIS — J309 Allergic rhinitis, unspecified: Secondary | ICD-10-CM | POA: Diagnosis not present

## 2022-07-23 ENCOUNTER — Ambulatory Visit (INDEPENDENT_AMBULATORY_CARE_PROVIDER_SITE_OTHER): Payer: PPO | Admitting: *Deleted

## 2022-07-23 DIAGNOSIS — J309 Allergic rhinitis, unspecified: Secondary | ICD-10-CM

## 2022-08-10 DIAGNOSIS — J019 Acute sinusitis, unspecified: Secondary | ICD-10-CM | POA: Diagnosis not present

## 2022-08-18 ENCOUNTER — Other Ambulatory Visit (HOSPITAL_COMMUNITY): Payer: Self-pay

## 2022-08-20 ENCOUNTER — Other Ambulatory Visit (HOSPITAL_COMMUNITY): Payer: Self-pay

## 2022-08-24 ENCOUNTER — Ambulatory Visit (INDEPENDENT_AMBULATORY_CARE_PROVIDER_SITE_OTHER): Payer: PPO | Admitting: *Deleted

## 2022-08-24 DIAGNOSIS — J309 Allergic rhinitis, unspecified: Secondary | ICD-10-CM | POA: Diagnosis not present

## 2022-08-25 DIAGNOSIS — J302 Other seasonal allergic rhinitis: Secondary | ICD-10-CM | POA: Diagnosis not present

## 2022-08-25 NOTE — Progress Notes (Signed)
VIAL EXP 08-26-23

## 2022-09-07 ENCOUNTER — Ambulatory Visit (INDEPENDENT_AMBULATORY_CARE_PROVIDER_SITE_OTHER): Payer: PPO

## 2022-09-07 DIAGNOSIS — J309 Allergic rhinitis, unspecified: Secondary | ICD-10-CM | POA: Diagnosis not present

## 2022-09-21 ENCOUNTER — Ambulatory Visit (INDEPENDENT_AMBULATORY_CARE_PROVIDER_SITE_OTHER): Payer: PPO | Admitting: *Deleted

## 2022-09-21 DIAGNOSIS — J309 Allergic rhinitis, unspecified: Secondary | ICD-10-CM | POA: Diagnosis not present

## 2022-09-24 ENCOUNTER — Other Ambulatory Visit (HOSPITAL_COMMUNITY): Payer: Self-pay

## 2022-09-24 ENCOUNTER — Encounter: Payer: Self-pay | Admitting: Emergency Medicine

## 2022-09-24 ENCOUNTER — Ambulatory Visit (INDEPENDENT_AMBULATORY_CARE_PROVIDER_SITE_OTHER): Payer: PPO | Admitting: Emergency Medicine

## 2022-09-24 VITALS — BP 108/76 | HR 84 | Temp 98.2°F | Ht 61.0 in | Wt 116.4 lb

## 2022-09-24 DIAGNOSIS — M5030 Other cervical disc degeneration, unspecified cervical region: Secondary | ICD-10-CM

## 2022-09-24 DIAGNOSIS — J4520 Mild intermittent asthma, uncomplicated: Secondary | ICD-10-CM

## 2022-09-24 DIAGNOSIS — E785 Hyperlipidemia, unspecified: Secondary | ICD-10-CM | POA: Diagnosis not present

## 2022-09-24 DIAGNOSIS — K219 Gastro-esophageal reflux disease without esophagitis: Secondary | ICD-10-CM | POA: Diagnosis not present

## 2022-09-24 DIAGNOSIS — M5137 Other intervertebral disc degeneration, lumbosacral region: Secondary | ICD-10-CM | POA: Diagnosis not present

## 2022-09-24 DIAGNOSIS — M51379 Other intervertebral disc degeneration, lumbosacral region without mention of lumbar back pain or lower extremity pain: Secondary | ICD-10-CM

## 2022-09-24 DIAGNOSIS — Z23 Encounter for immunization: Secondary | ICD-10-CM | POA: Diagnosis not present

## 2022-09-24 MED ORDER — CYCLOBENZAPRINE HCL 5 MG PO TABS
5.0000 mg | ORAL_TABLET | Freq: Every day | ORAL | 3 refills | Status: DC
  Filled 2022-09-24: qty 90, 90d supply, fill #0

## 2022-09-24 MED ORDER — GABAPENTIN 300 MG PO CAPS
300.0000 mg | ORAL_CAPSULE | Freq: Four times a day (QID) | ORAL | 3 refills | Status: DC
  Filled 2022-09-24: qty 360, 90d supply, fill #0

## 2022-09-24 NOTE — Assessment & Plan Note (Signed)
Stable.  Well-controlled.  No medications.

## 2022-09-24 NOTE — Assessment & Plan Note (Signed)
Stable.  Diet and nutrition discussed.  Continue rosuvastatin 10 mg daily.  

## 2022-09-24 NOTE — Assessment & Plan Note (Signed)
Well-controlled.  Not actively using any inhalers at present time.

## 2022-09-24 NOTE — Assessment & Plan Note (Addendum)
Stable and asymptomatic. Continue gabapentin as needed for neuropathic pain Presently taking 300 mg 4 times a day.

## 2022-09-24 NOTE — Assessment & Plan Note (Signed)
Stable continue Flexeril as needed.

## 2022-09-24 NOTE — Progress Notes (Signed)
Anne Mills 76 y.o.   Chief Complaint  Patient presents with   Follow-up    F/u medication refill     HISTORY OF PRESENT ILLNESS: This is a 76 y.o. female [name] here for 45-monthfollow-up and medication refill. Overall doing well. Has not plaints or medical concerns today.  HPI   Prior to Admission medications   Medication Sig Start Date End Date Taking? Authorizing Provider  B Complex Vitamins (VITAMIN B-COMPLEX PO) Take by mouth. Liquid   Yes [provider]  Calcium Carbonate-Vitamin D (CALCIUM 600 + D PO) Take 1 tablet by mouth daily.   Yes [provider]  Camphor-Menthol-Methyl Sal 1.2-5.7-6.3 % PTCH Apply topically.   Yes [provider]  Cholecalciferol 2000 UNITS TABS Take 1 tablet by mouth daily.   Yes [provider]  clobetasol (OLUX) 0.05 % topical foam Apply topically 2 (two) times daily. 05/20/22  Yes Anne Monarch MD  cyclobenzaprine (FLEXERIL) 5 MG tablet Take 1 tablet (5 mg total) by mouth at bedtime. 10/10/21  Yes   EVENING PRIMROSE OIL PO Take 1,300 mg by mouth.   Yes [provider]  gabapentin (NEURONTIN) 300 MG capsule Take 1 capsule (300 mg total) by mouth 4 (four) times daily. 02/20/22  Yes Anne Mills, Anne Bloomer MD  Magnesium 500 MG CAPS Take by mouth.   Yes [provider]  Misc Natural Products (OSTEO BI-FLEX TRIPLE STRENGTH) TABS Take 2 tablets by mouth daily.   Yes [provider]  Multiple Vitamins-Minerals (STRESS TAB NF) TABS Take 1 tablet by mouth daily.   Yes [provider]  OVER THE COUNTER MEDICATION Transitions w/Hesperidin vegetarian taking   Yes [provider]  OVER THE COUNTER MEDICATION Wal-Finate Dw/Pseudoephedrine taking   Yes [provider]  Probiotic Product (PROBIOTIC PO) Take by mouth. 10 Billion Active Cultures   Yes [provider]  rosuvastatin (CRESTOR) 10 MG tablet TAKE 1 TABLET BY MOUTH EVERY DAY 04/10/22  Yes Anne Mills,  Anne Bloomer MD  Trolamine Salicylate (ASPERCREME EX) Apply 1 application topically 2 (two) times daily as needed (for back pain).   Yes [provider]    Allergies  Allergen Reactions   Gluten Meal Cough   Biaxin [Clarithromycin] Nausea And Vomiting    Patient Active Problem List   Diagnosis Date Noted   Pain of toe of left foot 02/10/2022   Dyslipidemia 01/13/2022   DDD (degenerative disc disease), cervical 06/01/2019   Degeneration of lumbar or lumbosacral intervertebral disc 06/01/2019   Asthma 12/13/2017   Gastroesophageal reflux disease 12/13/2017   Seasonal allergic conjunctivitis 12/13/2017   Scoliosis (and kyphoscoliosis), idiopathic 09/15/2017   Cervical stenosis (uterine cervix) 03/03/2013    Past Medical History:  Diagnosis Date   Allergy    Asthma    no inhaler   Back pain, chronic    Chest pain    under left breast- 2-3 x per week: h/o breast surgery   GERD (gastroesophageal reflux disease)    Headache(784.0)    Heart murmur    "not serious" per pt-no echo   Scoliosis    Seasonal allergies    Shortness of breath    per pt due to allergies    Past Surgical History:  Procedure Laterality Date   ABDOMINOPLASTY     BREAST SURGERY     lt cyst   COLONOSCOPY     DILATION AND CURETTAGE OF UTERUS N/A 03/03/2013   Procedure: DILATATION AND CURETTAGE;  Surgeon: LLahoma Crocker MD;  Location: [facility];  Service: Gynecology;  Laterality: N/A;   EXAMINATION UNDER ANESTHESIA N/A 03/03/2013   Procedure: EXAM UNDER ANESTHESIA with ultrasound;  Surgeon: Anne Crocker, MD;  Location: [facility];  Service: Gynecology;  Laterality: N/A;   EYE SURGERY  10/21 ---10/7 /2021   catarct both eyes   TRIGGER FINGER RELEASE Left 06/21/2014   Procedure: LEFT THUMB TRIGGER RELEASE;  Surgeon: Anne Must, MD;  Location: [facility];  Service: Orthopedics;  Laterality: Left;    Social History   Socioeconomic History   Marital status: Married     Spouse name: Not on file   Number of children: Not on file   Years of education: Not on file   Highest education level: Not on file  Occupational History   Occupation: retired    Comment: house cleaning  Tobacco Use   Smoking status: Never   Smokeless tobacco: Never  Vaping Use   Vaping Use: Never used  Substance and Sexual Activity   Alcohol use: No   Drug use: No   Sexual activity: Yes    Birth control/protection: Post-menopausal  Other Topics Concern   Not on file  Social History Narrative   Marital status: married x 20 years; second marriage; happily married.  From [place]. Left [place] age 60; grew up [place].      Children:  2 daughters; 8 grandchildren; 72 gg.      Lives: with husband, daughter.      Employment: retired; clean homes      Tobacco; none      Alcohol: none      Drugs: none      Exercise: sporadic in 2019.  [place].      ADLs: independent with ADLs; no assistant devices.      Advanced Directives:  FULL CODE; no prolonged measures.         Social Determinants of Health   Financial Resource Strain: Not on file  Food Insecurity: Not on file  Transportation Needs: Not on file  Physical Activity: Not on file  Stress: Not on file  Social Connections: Not on file  Intimate Partner Violence: Not on file    Family History  Problem Relation Age of Onset   Dementia Mother    Hypertension Mother    Cancer Mother 88       Breast cancer   Depression Mother    Cancer Sister    Diabetes Sister    Heart disease Sister 8       AMI as cause of death   Hyperlipidemia Sister    Hypertension Sister    Mental retardation Sister    Cancer Father        prostate cancer   Heart disease Sister 88       AMI   Diabetes Sister      Review of Systems  Constitutional: Negative.  Negative for chills and fever.  HENT: Negative.  Negative for congestion and sore throat.   Eyes: Negative.   Respiratory:  Negative for cough and shortness of breath.    Cardiovascular: Negative.  Negative for chest pain and palpitations.  Gastrointestinal: Negative.  Negative for abdominal pain, nausea and vomiting.  Genitourinary: Negative.   Skin: Negative.  Negative for rash.  Neurological: Negative.  Negative for dizziness and headaches.   Today's Vitals   09/24/22 1355  BP: 108/76  Pulse: 84  Temp: 98.2 F (36.8 C)  TempSrc: Oral  SpO2: 97%  Weight: 116  lb 6 oz (52.8 kg)  Height: '5\' 1"'$  (1.549 m)   Body mass index is 21.99 kg/m.   Physical Exam Vitals reviewed.  Constitutional:      Appearance: Normal appearance.  HENT:     Head: Normocephalic.     Mouth/Throat:     Mouth: Mucous membranes are moist.     Pharynx: Oropharynx is clear.  Eyes:     Extraocular Movements: Extraocular movements intact.     Conjunctiva/sclera: Conjunctivae normal.     Pupils: Pupils are equal, round, and reactive to light.  Cardiovascular:     Rate and Rhythm: Normal rate and regular rhythm.     Pulses: Normal pulses.     Heart sounds: Normal heart sounds.  Pulmonary:     Effort: Pulmonary effort is normal.     Breath sounds: Normal breath sounds.  Musculoskeletal:     Cervical back: No tenderness.  Lymphadenopathy:     Cervical: No cervical adenopathy.  Skin:    General: Skin is warm and dry.     Capillary Refill: Capillary refill takes less than 2 seconds.  Neurological:     General: No focal deficit present.     Mental Status: She is alert and oriented to person, place, and time.  Psychiatric:        Mood and Affect: Mood normal.        Behavior: Behavior normal.      ASSESSMENT & PLAN: A total of 44 minutes was spent with the patient and counseling/coordination of care regarding preparing for this visit, review of most recent office visit notes, review of multiple chronic medical conditions and their management, review of all medications, review of most recent blood work results, education on nutrition, prognosis, documentation, and need  for follow-up.  Problem List Items Addressed This Visit       Respiratory   Asthma    Well-controlled.  Not actively using any inhalers at present time.        Digestive   Gastroesophageal reflux disease    Stable.  Well-controlled.  No medications.        Musculoskeletal and Integument   DDD (degenerative disc disease), cervical    Stable continue Flexeril as needed.      Relevant Medications   cyclobenzaprine (FLEXERIL) 5 MG tablet   Degeneration of lumbar or lumbosacral intervertebral disc    Stable and asymptomatic. Continue gabapentin as needed for neuropathic pain Presently taking 300 mg 4 times a day.      Relevant Medications   cyclobenzaprine (FLEXERIL) 5 MG tablet   gabapentin (NEURONTIN) 300 MG capsule   Other Relevant Orders   Flu Vaccine QUAD High Dose(Fluad) (Completed)     Other   Dyslipidemia - Primary    Stable.  Diet and nutrition discussed. Continue rosuvastatin 10 mg daily.      Patient Instructions  Health Maintenance After Age 15 After age 49, you are at a higher risk for certain long-term diseases and infections as well as injuries from falls. Falls are a major cause of broken bones and head injuries in people who are older than age 67. Getting regular preventive care can help to keep you healthy and well. Preventive care includes getting regular testing and making lifestyle changes as recommended by your health care provider. Talk with your health care provider about: Which screenings and tests you should have. A screening is a test that checks for a disease when you have no symptoms. A diet and exercise plan  that is right for you. What should I know about screenings and tests to prevent falls? Screening and testing are the best ways to find a health problem early. Early diagnosis and treatment give you the best chance of managing medical conditions that are common after age 83. Certain conditions and lifestyle choices may make you more likely  to have a fall. Your health care provider may recommend: Regular vision checks. Poor vision and conditions such as cataracts can make you more likely to have a fall. If you wear glasses, make sure to get your prescription updated if your vision changes. Medicine review. Work with your health care provider to regularly review all of the medicines you are taking, including over-the-counter medicines. Ask your health care provider about any side effects that may make you more likely to have a fall. Tell your health care provider if any medicines that you take make you feel dizzy or sleepy. Strength and balance checks. Your health care provider may recommend certain tests to check your strength and balance while standing, walking, or changing positions. Foot health exam. Foot pain and numbness, as well as not wearing proper footwear, can make you more likely to have a fall. Screenings, including: Osteoporosis screening. Osteoporosis is a condition that causes the bones to get weaker and break more easily. Blood pressure screening. Blood pressure changes and medicines to control blood pressure can make you feel dizzy. Depression screening. You may be more likely to have a fall if you have a fear of falling, feel depressed, or feel unable to do activities that you used to do. Alcohol use screening. Using too much alcohol can affect your balance and may make you more likely to have a fall. Follow these instructions at home: Lifestyle Do not drink alcohol if: Your health care provider tells you not to drink. If you drink alcohol: Limit how much you have to: 0-1 drink a day for women. 0-2 drinks a day for men. Know how much alcohol is in your drink. In the [place] one drink equals one 12 oz bottle of beer (355 mL), one 5 oz glass of wine (148 mL), or one 1 oz glass of hard liquor (44 mL). Do not use any products that contain nicotine or tobacco. These products include cigarettes, chewing tobacco, and vaping  devices, such as e-cigarettes. If you need help quitting, ask your health care provider. Activity  Follow a regular exercise program to stay fit. This will help you maintain your balance. Ask your health care provider what types of exercise are appropriate for you. If you need a cane or walker, use it as recommended by your health care provider. Wear supportive shoes that have nonskid soles. Safety  Remove any tripping hazards, such as rugs, cords, and clutter. Install safety equipment such as grab bars in bathrooms and safety rails on stairs. Keep rooms and walkways well-lit. General instructions Talk with your health care provider about your risks for falling. Tell your health care provider if: You fall. Be sure to tell your health care provider about all falls, even ones that seem minor. You feel dizzy, tiredness (fatigue), or off-balance. Take over-the-counter and prescription medicines only as told by your health care provider. These include supplements. Eat a healthy diet and maintain a healthy weight. A healthy diet includes low-fat dairy products, low-fat (lean) meats, and fiber from whole grains, beans, and lots of fruits and vegetables. Stay current with your vaccines. Schedule regular health, dental, and eye exams. Summary Having a  healthy lifestyle and getting preventive care can help to protect your health and wellness after age 72. Screening and testing are the best way to find a health problem early and help you avoid having a fall. Early diagnosis and treatment give you the best chance for managing medical conditions that are more common for people who are older than age 69. Falls are a major cause of broken bones and head injuries in people who are older than age 38. Take precautions to prevent a fall at home. Work with your health care provider to learn what changes you can make to improve your health and wellness and to prevent falls. This information is not intended to  replace advice given to you by your health care provider. Make sure you discuss any questions you have with your health care provider. Document Revised: 04/14/2021 Document Reviewed: 04/14/2021 Elsevier Patient Education  [place], MD [facility]

## 2022-09-24 NOTE — Patient Instructions (Signed)
Health Maintenance After Age 75 After age 75, you are at a higher risk for certain long-term diseases and infections as well as injuries from falls. Falls are a major cause of broken bones and head injuries in people who are older than age 75. Getting regular preventive care can help to keep you healthy and well. Preventive care includes getting regular testing and making lifestyle changes as recommended by your health care provider. Talk with your health care provider about: Which screenings and tests you should have. A screening is a test that checks for a disease when you have no symptoms. A diet and exercise plan that is right for you. What should I know about screenings and tests to prevent falls? Screening and testing are the best ways to find a health problem early. Early diagnosis and treatment give you the best chance of managing medical conditions that are common after age 75. Certain conditions and lifestyle choices may make you more likely to have a fall. Your health care provider may recommend: Regular vision checks. Poor vision and conditions such as cataracts can make you more likely to have a fall. If you wear glasses, make sure to get your prescription updated if your vision changes. Medicine review. Work with your health care provider to regularly review all of the medicines you are taking, including over-the-counter medicines. Ask your health care provider about any side effects that may make you more likely to have a fall. Tell your health care provider if any medicines that you take make you feel dizzy or sleepy. Strength and balance checks. Your health care provider may recommend certain tests to check your strength and balance while standing, walking, or changing positions. Foot health exam. Foot pain and numbness, as well as not wearing proper footwear, can make you more likely to have a fall. Screenings, including: Osteoporosis screening. Osteoporosis is a condition that causes  the bones to get weaker and break more easily. Blood pressure screening. Blood pressure changes and medicines to control blood pressure can make you feel dizzy. Depression screening. You may be more likely to have a fall if you have a fear of falling, feel depressed, or feel unable to do activities that you used to do. Alcohol use screening. Using too much alcohol can affect your balance and may make you more likely to have a fall. Follow these instructions at home: Lifestyle Do not drink alcohol if: Your health care provider tells you not to drink. If you drink alcohol: Limit how much you have to: 0-1 drink a day for women. 0-2 drinks a day for men. Know how much alcohol is in your drink. In the [place] one drink equals one 12 oz bottle of beer (355 mL), one 5 oz glass of wine (148 mL), or one 1 oz glass of hard liquor (44 mL). Do not use any products that contain nicotine or tobacco. These products include cigarettes, chewing tobacco, and vaping devices, such as e-cigarettes. If you need help quitting, ask your health care provider. Activity  Follow a regular exercise program to stay fit. This will help you maintain your balance. Ask your health care provider what types of exercise are appropriate for you. If you need a cane or walker, use it as recommended by your health care provider. Wear supportive shoes that have nonskid soles. Safety  Remove any tripping hazards, such as rugs, cords, and clutter. Install safety equipment such as grab bars in bathrooms and safety rails on stairs. Keep rooms and walkways   well-lit. General instructions Talk with your health care provider about your risks for falling. Tell your health care provider if: You fall. Be sure to tell your health care provider about all falls, even ones that seem minor. You feel dizzy, tiredness (fatigue), or off-balance. Take over-the-counter and prescription medicines only as told by your health care provider. These include  supplements. Eat a healthy diet and maintain a healthy weight. A healthy diet includes low-fat dairy products, low-fat (lean) meats, and fiber from whole grains, beans, and lots of fruits and vegetables. Stay current with your vaccines. Schedule regular health, dental, and eye exams. Summary Having a healthy lifestyle and getting preventive care can help to protect your health and wellness after age 75. Screening and testing are the best way to find a health problem early and help you avoid having a fall. Early diagnosis and treatment give you the best chance for managing medical conditions that are more common for people who are older than age 75. Falls are a major cause of broken bones and head injuries in people who are older than age 75. Take precautions to prevent a fall at home. Work with your health care provider to learn what changes you can make to improve your health and wellness and to prevent falls. This information is not intended to replace advice given to you by your health care provider. Make sure you discuss any questions you have with your health care provider. Document Revised: 04/14/2021 Document Reviewed: 04/14/2021 Elsevier Patient Education  2023 Elsevier Inc.  

## 2022-09-25 ENCOUNTER — Other Ambulatory Visit (HOSPITAL_COMMUNITY): Payer: Self-pay

## 2022-10-07 ENCOUNTER — Ambulatory Visit (INDEPENDENT_AMBULATORY_CARE_PROVIDER_SITE_OTHER): Payer: PPO

## 2022-10-07 DIAGNOSIS — J309 Allergic rhinitis, unspecified: Secondary | ICD-10-CM | POA: Diagnosis not present

## 2022-10-19 ENCOUNTER — Ambulatory Visit (INDEPENDENT_AMBULATORY_CARE_PROVIDER_SITE_OTHER): Payer: PPO | Admitting: *Deleted

## 2022-10-19 DIAGNOSIS — J309 Allergic rhinitis, unspecified: Secondary | ICD-10-CM | POA: Diagnosis not present

## 2022-10-26 ENCOUNTER — Ambulatory Visit (INDEPENDENT_AMBULATORY_CARE_PROVIDER_SITE_OTHER): Payer: PPO | Admitting: *Deleted

## 2022-10-26 DIAGNOSIS — J309 Allergic rhinitis, unspecified: Secondary | ICD-10-CM

## 2022-11-05 ENCOUNTER — Ambulatory Visit (INDEPENDENT_AMBULATORY_CARE_PROVIDER_SITE_OTHER): Payer: PPO

## 2022-11-05 DIAGNOSIS — J309 Allergic rhinitis, unspecified: Secondary | ICD-10-CM | POA: Diagnosis not present

## 2022-11-10 ENCOUNTER — Ambulatory Visit (INDEPENDENT_AMBULATORY_CARE_PROVIDER_SITE_OTHER): Payer: PPO

## 2022-11-10 DIAGNOSIS — J309 Allergic rhinitis, unspecified: Secondary | ICD-10-CM

## 2022-11-16 DIAGNOSIS — H524 Presbyopia: Secondary | ICD-10-CM | POA: Diagnosis not present

## 2022-11-16 DIAGNOSIS — H26493 Other secondary cataract, bilateral: Secondary | ICD-10-CM | POA: Diagnosis not present

## 2022-11-16 DIAGNOSIS — H16223 Keratoconjunctivitis sicca, not specified as Sjogren's, bilateral: Secondary | ICD-10-CM | POA: Diagnosis not present

## 2022-11-24 ENCOUNTER — Ambulatory Visit (INDEPENDENT_AMBULATORY_CARE_PROVIDER_SITE_OTHER): Payer: PPO

## 2022-11-24 DIAGNOSIS — J309 Allergic rhinitis, unspecified: Secondary | ICD-10-CM | POA: Diagnosis not present

## 2022-12-09 ENCOUNTER — Ambulatory Visit (INDEPENDENT_AMBULATORY_CARE_PROVIDER_SITE_OTHER): Payer: PPO

## 2022-12-09 DIAGNOSIS — J309 Allergic rhinitis, unspecified: Secondary | ICD-10-CM | POA: Diagnosis not present

## 2022-12-10 ENCOUNTER — Telehealth: Payer: Self-pay | Admitting: Emergency Medicine

## 2022-12-10 NOTE — Telephone Encounter (Signed)
Left message for patient to call back to schedule Medicare Annual Wellness Visit   Last AWV  01/10/20  Please schedule at anytime with [facility] if patient calls the office back.     Any questions, please call me at [phone]

## 2022-12-28 ENCOUNTER — Ambulatory Visit (INDEPENDENT_AMBULATORY_CARE_PROVIDER_SITE_OTHER): Payer: Medicare Other

## 2022-12-28 DIAGNOSIS — J309 Allergic rhinitis, unspecified: Secondary | ICD-10-CM

## 2022-12-31 ENCOUNTER — Telehealth: Payer: Self-pay | Admitting: Emergency Medicine

## 2022-12-31 DIAGNOSIS — M5137 Other intervertebral disc degeneration, lumbosacral region: Secondary | ICD-10-CM

## 2022-12-31 MED ORDER — GABAPENTIN 300 MG PO CAPS: 300.0000 mg | ORAL_CAPSULE | Freq: Four times a day (QID) | ORAL | 3 refills | Status: DC

## 2022-12-31 NOTE — Telephone Encounter (Signed)
Caller & Relationship to patient:  self   Call back number:   [phone]   Date of last office visit:   Date of next office visit:  09/24/2022   Medication(s) to be refilled:  Gabapentin 300 mg        Preferred Pharmacy:  [facility]

## 2022-12-31 NOTE — Telephone Encounter (Signed)
New medication sent to patient pharmacy  

## 2023-01-11 ENCOUNTER — Ambulatory Visit (INDEPENDENT_AMBULATORY_CARE_PROVIDER_SITE_OTHER): Payer: Medicare Other

## 2023-01-11 DIAGNOSIS — J309 Allergic rhinitis, unspecified: Secondary | ICD-10-CM

## 2023-01-18 NOTE — Progress Notes (Signed)
VIAL EXP 01-19-24

## 2023-01-19 DIAGNOSIS — J302 Other seasonal allergic rhinitis: Secondary | ICD-10-CM

## 2023-02-01 ENCOUNTER — Ambulatory Visit (INDEPENDENT_AMBULATORY_CARE_PROVIDER_SITE_OTHER): Payer: Medicare Other | Admitting: *Deleted

## 2023-02-01 DIAGNOSIS — J309 Allergic rhinitis, unspecified: Secondary | ICD-10-CM

## 2023-02-09 ENCOUNTER — Ambulatory Visit: Payer: PPO | Admitting: Allergy and Immunology

## 2023-02-09 ENCOUNTER — Other Ambulatory Visit: Payer: Self-pay

## 2023-02-09 ENCOUNTER — Encounter: Payer: Self-pay | Admitting: Allergy and Immunology

## 2023-02-09 VITALS — BP 110/76 | HR 93 | Temp 98.1°F | Resp 20 | Ht 61.81 in | Wt 114.3 lb

## 2023-02-09 DIAGNOSIS — H04123 Dry eye syndrome of bilateral lacrimal glands: Secondary | ICD-10-CM

## 2023-02-09 DIAGNOSIS — J3089 Other allergic rhinitis: Secondary | ICD-10-CM

## 2023-02-09 DIAGNOSIS — J0140 Acute pansinusitis, unspecified: Secondary | ICD-10-CM | POA: Diagnosis not present

## 2023-02-09 MED ORDER — PREDNISONE 10 MG PO TABS: 10.0000 mg | ORAL_TABLET | Freq: Every morning | ORAL | 0 refills | Status: AC

## 2023-02-09 MED ORDER — FLUTICASONE PROPIONATE 50 MCG/ACT NA SUSP: 2.0000 | Freq: Every day | NASAL | 3 refills | Status: AC

## 2023-02-09 NOTE — Progress Notes (Unsigned)
[facility]   Follow-up Note  Referring Provider: Horald Pollen, * Primary Provider: Horald Pollen, MD Date of Office Visit: 02/09/2023  Subjective:   Anne Mills (DOB: Apr 22, 1946) is a 77 y.o. female who returns to the [facility] on 02/09/2023 in re-evaluation of the following:  HPI: Anne Mills returns this clinic in reevaluation of allergic rhinitis and dry eye.  Her last visit to this clinic with me was 17 March 2022.    Approximately 10 days ago upon her return from [place] she developed nasal congestion and nose blowing and some headache and sore throat and drainage in her throat and a little bit of cough without any anosmia or ugly nasal discharge or fever or chills or aches.  She started her Flonase yesterday.  Prior to that event she has really done very well.  She continues on immunotherapy without any adverse effect and she uses a nasal steroid intermittently and rarely and does not use any antihistamine and is able to go through each season of the year without any difficulty.  Allergies as of 02/09/2023       Reactions   Gluten Meal Cough   Biaxin [clarithromycin] Nausea And Vomiting        Medication List    ASPERCREME EX Apply 1 application topically 2 (two) times daily as needed (for back pain).   CALCIUM 600 + D PO Take 1 tablet by mouth daily.   Camphor-Menthol-Methyl Sal 1.2-5.7-6.3 % Ptch Apply topically.   Cholecalciferol 50 MCG (2000 UT) Tabs Take 1 tablet by mouth daily.   clobetasol 0.05 % topical foam Commonly known as: OLUX Apply topically 2 (two) times daily.   cyclobenzaprine 5 MG tablet Commonly known as: FLEXERIL Take 1 tablet (5 mg total) by mouth at bedtime.   cyclobenzaprine 5 MG tablet Commonly known as: FLEXERIL Take 1 tablet (5 mg total) by mouth at bedtime.   EVENING PRIMROSE OIL PO Take 1,300 mg by mouth.   gabapentin 300 MG capsule Commonly known  as: NEURONTIN Take 1 capsule (300 mg total) by mouth 4 (four) times daily.   Magnesium 500 MG Caps Take by mouth.   Osteo Bi-Flex Triple Strength Tabs Take 2 tablets by mouth daily.   OVER THE COUNTER MEDICATION Transitions w/Hesperidin vegetarian taking   OVER THE COUNTER MEDICATION Wal-Finate Dw/Pseudoephedrine taking   PROBIOTIC PO Take by mouth. 10 Billion Active Cultures   rosuvastatin 10 MG tablet Commonly known as: CRESTOR TAKE 1 TABLET BY MOUTH EVERY DAY   Stress Tab NF Tabs Take 1 tablet by mouth daily.   VITAMIN B-COMPLEX PO Take by mouth. Liquid     Past Medical History:  Diagnosis Date   Allergy    Asthma    no inhaler   Back pain, chronic    Chest pain    under left breast- 2-3 x per week: h/o breast surgery   GERD (gastroesophageal reflux disease)    Headache(784.0)    Heart murmur    "not serious" per pt-no echo   Scoliosis    Seasonal allergies    Shortness of breath    per pt due to allergies    Past Surgical History:  Procedure Laterality Date   ABDOMINOPLASTY     BREAST SURGERY     lt cyst   COLONOSCOPY     DILATION AND CURETTAGE OF UTERUS N/A 03/03/2013   Procedure: DILATATION AND CURETTAGE;  Surgeon: Lahoma Crocker,  MD;  Location: [facility];  Service: Gynecology;  Laterality: N/A;   EXAMINATION UNDER ANESTHESIA N/A 03/03/2013   Procedure: EXAM UNDER ANESTHESIA with ultrasound;  Surgeon: Lahoma Crocker, MD;  Location: [facility];  Service: Gynecology;  Laterality: N/A;   EYE SURGERY  10/21 ---10/7 /2021   catarct both eyes   TRIGGER FINGER RELEASE Left 06/21/2014   Procedure: LEFT THUMB TRIGGER RELEASE;  Surgeon: Tennis Must, MD;  Location: [facility];  Service: Orthopedics;  Laterality: Left;    Review of systems negative except as noted in HPI / PMHx or noted below:  Review of Systems  Constitutional: Negative.   HENT: Negative.    Eyes: Negative.   Respiratory: Negative.    Cardiovascular: Negative.    Gastrointestinal: Negative.   Genitourinary: Negative.   Musculoskeletal: Negative.   Skin: Negative.   Neurological: Negative.   Endo/Heme/Allergies: Negative.   Psychiatric/Behavioral: Negative.       Objective:   Vitals:   02/09/23 1410  BP: 110/76  Pulse: 93  Resp: 20  Temp: 98.1 F (36.7 C)  SpO2: 96%   Height: 5' 1.81" (157 cm)  Weight: 114 lb 4.8 oz (51.8 kg)   Physical Exam Constitutional:      Appearance: She is not diaphoretic.  HENT:     Head: Normocephalic.     Right Ear: Tympanic membrane, ear canal and external ear normal.     Left Ear: Tympanic membrane, ear canal and external ear normal.     Nose: Nose normal. No mucosal edema or rhinorrhea.     Mouth/Throat:     Pharynx: Uvula midline. No oropharyngeal exudate.  Eyes:     Conjunctiva/sclera: Conjunctivae normal.  Neck:     Thyroid: No thyromegaly.     Trachea: Trachea normal. No tracheal tenderness or tracheal deviation.  Cardiovascular:     Rate and Rhythm: Normal rate and regular rhythm.     Heart sounds: Normal heart sounds, S1 normal and S2 normal. No murmur heard. Pulmonary:     Effort: No respiratory distress.     Breath sounds: Normal breath sounds. No stridor. No wheezing or rales.  Lymphadenopathy:     Head:     Right side of head: No tonsillar adenopathy.     Left side of head: No tonsillar adenopathy.     Cervical: No cervical adenopathy.  Skin:    Findings: No erythema or rash.     Nails: There is no clubbing.  Neurological:     Mental Status: She is alert.     Diagnostics: none  Assessment and Plan:   1. Perennial allergic rhinitis   2. Dry eye syndrome of both eyes   3. Acute non-recurrent pansinusitis    1.  Continue immunotherapy    2.  Use OTC Flonase -1 spray each nostril 1-7 times per week if needed  3. Use the following for this recent event:   A. Nasal saline a few times per day  B. Prednisone 10 mg - 1 tablet 1 time a day for 10 days only  4.  Return  to clinic in 12 months or earlier if problem  Anne Mills appears to have contracted a viral respiratory tract infection and we will treat her with the therapy noted above which includes some upper airway hygiene maneuvers and a very low-dose of anti-inflammatory medication in form of systemic steroid.  If she is better after 5 days she does not need to continue any additional prednisone.  She will  continue on immunotherapy and I will see her back in this clinic in 1 year or earlier if there is a problem.  Allena Katz, MD Allergy / Immunology [facility]

## 2023-02-09 NOTE — Patient Instructions (Addendum)
  1.  Continue immunotherapy    2.  Use OTC Flonase -1 spray each nostril 1-7 times per week if needed  3. Use the following for this recent event:   A. Nasal saline a few times per day  B. Prednisone 10 mg - 1 tablet 1 time a day for 10 days only  4.  Return to clinic in 12 months or earlier if problem

## 2023-02-10 ENCOUNTER — Encounter: Payer: Self-pay | Admitting: Allergy and Immunology

## 2023-02-11 ENCOUNTER — Encounter: Payer: Self-pay | Admitting: Allergy

## 2023-02-11 ENCOUNTER — Other Ambulatory Visit: Payer: Self-pay

## 2023-02-11 ENCOUNTER — Ambulatory Visit (INDEPENDENT_AMBULATORY_CARE_PROVIDER_SITE_OTHER): Payer: PPO | Admitting: Allergy

## 2023-02-11 VITALS — BP 108/70 | HR 84 | Temp 97.9°F

## 2023-02-11 DIAGNOSIS — H04123 Dry eye syndrome of bilateral lacrimal glands: Secondary | ICD-10-CM | POA: Diagnosis not present

## 2023-02-11 DIAGNOSIS — J0140 Acute pansinusitis, unspecified: Secondary | ICD-10-CM | POA: Diagnosis not present

## 2023-02-11 DIAGNOSIS — J3089 Other allergic rhinitis: Secondary | ICD-10-CM | POA: Diagnosis not present

## 2023-02-11 DIAGNOSIS — J0100 Acute maxillary sinusitis, unspecified: Secondary | ICD-10-CM

## 2023-02-11 MED ORDER — AMOXICILLIN-POT CLAVULANATE 875-125 MG PO TABS: 1.0000 | ORAL_TABLET | Freq: Two times a day (BID) | ORAL | 0 refills | Status: AC

## 2023-02-11 MED ORDER — PSEUDOEPH-BROMPHEN-DM 30-2-10 MG/5ML PO SYRP: 5.0000 mL | ORAL_SOLUTION | Freq: Four times a day (QID) | ORAL | 0 refills | Status: AC | PRN

## 2023-02-11 NOTE — Progress Notes (Signed)
Follow-up Note  RE: Anne Mills MRN: [id number] DOB: 1946/11/15 Date of Office Visit: 02/11/2023   History of present illness: Anne Mills is a 77 y.o. female presenting today for sick visit.  She has history of allergic rhinitis and dry eye syndrome.  She also has history of pansinusitis.  She saw Dr. Neldon Mc on 02/09/2023 where she reported symptoms of nasal congestion and and drainage, headache, sore throat and cough.  She states the symptoms have been ongoing for about a week and a half now.  She feels like the symptoms are not improving.  She was on a trip internationally prior to the onset of the symptoms.  She states the symptoms she is having right now feels like previous sinus infections she has had.  She also states in the past that she has been prescribed Bromfed and it has been helpful with her cough in the past and she would like a prescription possible.  Since her visit with Dr. Neldon Mc she has been using her Flonase and she was prescribed prednisone but she has been taking 10 mg 1 tablet daily but she feels like the prednisone is not helping her symptoms.  Review of systems: Review of Systems  Constitutional: Negative.   HENT:  Positive for congestion, rhinorrhea, sinus pressure, sinus pain and sore throat.   Eyes: Negative.   Respiratory:  Positive for cough.   Cardiovascular: Negative.   Gastrointestinal: Negative.   Musculoskeletal: Negative.   Skin: Negative.   Allergic/Immunologic: Negative.   Neurological: Negative.      All other systems negative unless noted above in HPI  Past medical/social/surgical/family history have been reviewed and are unchanged unless specifically indicated below.  No changes  Medication List: Current Outpatient Medications  Medication Sig Dispense Refill   amoxicillin-clavulanate (AUGMENTIN) 875-125 MG tablet Take 1 tablet by mouth 2 (two) times daily for 10 days. 20 tablet 0   B Complex Vitamins (VITAMIN B-COMPLEX PO) Take by  mouth. Liquid     brompheniramine-pseudoephedrine-DM (BROMFED DM) 30-2-10 MG/5ML syrup Take 5 mLs by mouth 4 (four) times daily as needed for up to 7 days (cough). 120 mL 0   Calcium Carbonate-Vitamin D (CALCIUM 600 + D PO) Take 1 tablet by mouth daily.     Cholecalciferol 2000 UNITS TABS Take 1 tablet by mouth daily.     cyclobenzaprine (FLEXERIL) 5 MG tablet Take 1 tablet (5 mg total) by mouth at bedtime. 90 tablet 0   fluticasone (FLONASE) 50 MCG/ACT nasal spray Place 2 sprays into both nostrils daily. 48 g 3   gabapentin (NEURONTIN) 300 MG capsule Take 1 capsule (300 mg total) by mouth 4 (four) times daily. 360 capsule 3   Magnesium 500 MG CAPS Take by mouth.     Misc Natural Products (OSTEO BI-FLEX TRIPLE STRENGTH) TABS Take 2 tablets by mouth daily.     Multiple Vitamins-Minerals (STRESS TAB NF) TABS Take 1 tablet by mouth daily.     OVER THE COUNTER MEDICATION Transitions w/Hesperidin vegetarian taking     OVER THE COUNTER MEDICATION Wal-Finate Dw/Pseudoephedrine taking     predniSONE (DELTASONE) 10 MG tablet Take 1 tablet (10 mg total) by mouth in the morning for 10 days. 10 tablet 0   Probiotic Product (PROBIOTIC PO) Take by mouth. 10 Billion Active Cultures     rosuvastatin (CRESTOR) 10 MG tablet TAKE 1 TABLET BY MOUTH EVERY DAY 90 tablet 3   Trolamine Salicylate (ASPERCREME EX) Apply 1 application topically 2 (two) times  daily as needed (for back pain).     Camphor-Menthol-Methyl Sal 1.2-5.7-6.3 % PTCH Apply topically. (Patient not taking: Reported on 02/09/2023)     clobetasol (OLUX) 0.05 % topical foam Apply topically 2 (two) times daily. (Patient not taking: Reported on 02/09/2023) 50 g 0   EVENING PRIMROSE OIL PO Take 1,300 mg by mouth. (Patient not taking: Reported on 02/09/2023)     No current facility-administered medications for this visit.     Known medication allergies: Allergies  Allergen Reactions   Gluten Meal Cough   Biaxin [Clarithromycin] Nausea And Vomiting      Physical examination: Blood pressure 108/70, pulse 84, temperature 97.9 F (36.6 C), temperature source Temporal, SpO2 97 %.  General: Alert, interactive, in no acute distress. HEENT: PERRLA, TMs pearly gray, turbinates moderately edematous with thick discharge, post-pharynx non erythematous. Neck: Supple without lymphadenopathy. Lungs: Clear to auscultation without wheezing, rhonchi or rales. {no increased work of breathing. CV: Normal S1, S2 without murmurs. Abdomen: Nondistended, nontender. Skin: Warm and dry, without lesions or rashes. Extremities:  No clubbing, cyanosis or edema. Neuro:   Grossly intact.  Diagnositics/Labs: None today  Assessment and plan: Acute sinusitis -not improving sinus symptoms including sinus pressure, pain, congestion, cough with recent travel as well we will go ahead at this time and treat with Augmentin as below.  I have also sent a prescription for Bromfed for as needed use with current symptoms Allergic rhinitis Dry eye syndrome  1.  Continue immunotherapy    2.  Use OTC Flonase -1 spray each nostril 1-7 times per week if needed  3. Use the following for this recent event:   A. Nasal saline a few times per day  B. Complete Prednisone course as directed by Dr Neldon Mc  C. Start Augmentin '875mg'$  1 tab twice a day for next 10 days  D. Use Bromfed 25m every 4-6 hours as needed for cough  4.  Return to clinic in 12 months or earlier if problem  I appreciate the opportunity to take part in Niema's care. Please do not hesitate to contact me with questions.  Sincerely,   SPrudy Feeler MD Allergy/[facility]

## 2023-02-11 NOTE — Patient Instructions (Addendum)
  1.  Continue immunotherapy    2.  Use OTC Flonase -1 spray each nostril 1-7 times per week if needed  3. Use the following for this recent event:   A. Nasal saline a few times per day  B. Complete Prednisone course as directed by Dr Neldon Mc  C. Start Augmentin '875mg'$  1 tab twice a day for next 10 days  D. Use Bromfed 89m every 4-6 hours as needed for cough  4.  Return to clinic in 12 months or earlier if problem

## 2023-02-22 ENCOUNTER — Ambulatory Visit (INDEPENDENT_AMBULATORY_CARE_PROVIDER_SITE_OTHER): Payer: PPO | Admitting: *Deleted

## 2023-02-22 DIAGNOSIS — J309 Allergic rhinitis, unspecified: Secondary | ICD-10-CM

## 2023-03-08 ENCOUNTER — Encounter: Payer: Self-pay | Admitting: Emergency Medicine

## 2023-03-08 ENCOUNTER — Ambulatory Visit (INDEPENDENT_AMBULATORY_CARE_PROVIDER_SITE_OTHER): Payer: PPO | Admitting: Emergency Medicine

## 2023-03-08 VITALS — BP 110/64 | HR 85 | Temp 98.1°F | Ht 61.0 in | Wt 112.0 lb

## 2023-03-08 DIAGNOSIS — K219 Gastro-esophageal reflux disease without esophagitis: Secondary | ICD-10-CM | POA: Diagnosis not present

## 2023-03-08 MED ORDER — PANTOPRAZOLE SODIUM 40 MG PO TBEC: 40.0000 mg | DELAYED_RELEASE_TABLET | Freq: Every day | ORAL | 3 refills | Status: DC

## 2023-03-08 NOTE — Assessment & Plan Note (Signed)
Active and affecting quality of life. Diet and nutrition discussed. Recommend to start pantoprazole 40 mg daily for 2 to 4 weeks Has appointment scheduled to see GI doctor for possible upper endoscopy.

## 2023-03-08 NOTE — Progress Notes (Signed)
Anne Mills 77 y.o.   Chief Complaint  Patient presents with   Gastroesophageal Reflux    Patient states he acid reflux is getting worse      HISTORY OF PRESENT ILLNESS: This is a 77 y.o. female complaining of reflux symptoms for couple of weeks. Recently returned from trip to [place] where she got sick with viral respiratory infection.  Recovered well. Complaining of almost daily heartburn which improves with apple vinegar. Denies rectal bleeding or melena.  Denies nausea or vomiting.  Able to eat and drink. No other associated symptoms. No other plaints or medical concerns today.  Gastroesophageal Reflux She complains of heartburn. She reports no abdominal pain, no chest pain, no coughing, no nausea or no sore throat. Pertinent negatives include no melena.     Prior to Admission medications   Medication Sig Start Date End Date Taking? Authorizing Provider  B Complex Vitamins (VITAMIN B-COMPLEX PO) Take by mouth. Liquid   Yes [provider]  Calcium Carbonate-Vitamin D (CALCIUM 600 + D PO) Take 1 tablet by mouth daily.   Yes [provider]  cyclobenzaprine (FLEXERIL) 5 MG tablet Take 1 tablet (5 mg total) by mouth at bedtime. 10/10/21  Yes   fluticasone (FLONASE) 50 MCG/ACT nasal spray Place 2 sprays into both nostrils daily. 02/09/23  Yes Kozlow, [name], MD  gabapentin (NEURONTIN) 300 MG capsule Take 1 capsule (300 mg total) by mouth 4 (four) times daily. 12/31/22 12/26/23 Yes Gita Dilger, Ines Bloomer, MD  Magnesium 500 MG CAPS Take by mouth.   Yes [provider]  Misc Natural Products (OSTEO BI-FLEX TRIPLE STRENGTH) TABS Take 2 tablets by mouth daily.   Yes [provider]  Multiple Vitamins-Minerals (STRESS TAB NF) TABS Take 1 tablet by mouth daily.   Yes [provider]  OVER THE COUNTER MEDICATION Transitions w/Hesperidin vegetarian taking   Yes [provider]  OVER THE COUNTER MEDICATION Wal-Finate Dw/Pseudoephedrine  taking   Yes [provider]  OVER THE COUNTER MEDICATION Take 470 mg by mouth daily. Butcher's broom root   Yes [provider]  pantoprazole (PROTONIX) 40 MG tablet Take 1 tablet (40 mg total) by mouth daily. 03/08/23  Yes SagardiaInes Bloomer, MD  Probiotic Product (PROBIOTIC PO) Take by mouth. 10 Billion Active Cultures   Yes [provider]  rosuvastatin (CRESTOR) 10 MG tablet TAKE 1 TABLET BY MOUTH EVERY DAY 04/10/22  Yes Cameren Earnest, Ines Bloomer, MD  Trolamine Salicylate (ASPERCREME EX) Apply 1 application topically 2 (two) times daily as needed (for back pain).   Yes [provider]  Camphor-Menthol-Methyl Sal 1.2-5.7-6.3 % PTCH Apply topically. Patient not taking: Reported on 02/09/2023    [provider]  Cholecalciferol 2000 UNITS TABS Take 1 tablet by mouth daily.    [provider]  clobetasol (OLUX) 0.05 % topical foam Apply topically 2 (two) times daily. Patient not taking: Reported on 02/09/2023 05/20/22   Lavonna Monarch, MD    Allergies  Allergen Reactions   Gluten Meal Cough   Biaxin [Clarithromycin] Nausea And Vomiting    Patient Active Problem List   Diagnosis Date Noted   Pain of toe of left foot 02/10/2022   Dyslipidemia 01/13/2022   DDD (degenerative disc disease), cervical 06/01/2019   Degeneration of lumbar or lumbosacral intervertebral disc 06/01/2019   Asthma 12/13/2017   Gastroesophageal reflux disease 12/13/2017   Seasonal allergic conjunctivitis 12/13/2017   Scoliosis (and kyphoscoliosis), idiopathic 09/15/2017   Cervical stenosis (uterine cervix) 03/03/2013  Past Medical History:  Diagnosis Date   Allergy    Asthma    no inhaler   Back pain, chronic    Chest pain    under left breast- 2-3 x per week: h/o breast surgery   GERD (gastroesophageal reflux disease)    Headache(784.0)    Heart murmur    "not serious" per pt-no echo   Scoliosis    Seasonal allergies    Shortness of breath    per pt due  to allergies    Past Surgical History:  Procedure Laterality Date   ABDOMINOPLASTY     BREAST SURGERY     lt cyst   COLONOSCOPY     DILATION AND CURETTAGE OF UTERUS N/A 03/03/2013   Procedure: DILATATION AND CURETTAGE;  Surgeon: Lahoma Crocker, MD;  Location: [facility];  Service: Gynecology;  Laterality: N/A;   EXAMINATION UNDER ANESTHESIA N/A 03/03/2013   Procedure: EXAM UNDER ANESTHESIA with ultrasound;  Surgeon: Lahoma Crocker, MD;  Location: [facility];  Service: Gynecology;  Laterality: N/A;   EYE SURGERY  10/21 ---10/7 /2021   catarct both eyes   TRIGGER FINGER RELEASE Left 06/21/2014   Procedure: LEFT THUMB TRIGGER RELEASE;  Surgeon: Tennis Must, MD;  Location: [facility];  Service: Orthopedics;  Laterality: Left;    Social History   Socioeconomic History   Marital status: Married    Spouse name: Not on file   Number of children: Not on file   Years of education: Not on file   Highest education level: Not on file  Occupational History   Occupation: retired    Comment: house cleaning  Tobacco Use   Smoking status: Never   Smokeless tobacco: Never  Vaping Use   Vaping Use: Never used  Substance and Sexual Activity   Alcohol use: No   Drug use: No   Sexual activity: Yes    Birth control/protection: Post-menopausal  Other Topics Concern   Not on file  Social History Narrative   Marital status: married x 1 years; second marriage; happily married.  From [place]. Left [place] age 4; grew up [place].      Children:  2 daughters; 8 grandchildren; 84 gg.      Lives: with husband, daughter.      Employment: retired; clean homes      Tobacco; none      Alcohol: none      Drugs: none      Exercise: sporadic in 2019.  [place].      ADLs: independent with ADLs; no assistant devices.      Advanced Directives:  FULL CODE; no prolonged measures.         Social Determinants of Health   Financial Resource Strain: Not on file  Food Insecurity: Not  on file  Transportation Needs: Not on file  Physical Activity: Not on file  Stress: Not on file  Social Connections: Not on file  Intimate Partner Violence: Not on file    Family History  Problem Relation Age of Onset   Dementia Mother    Hypertension Mother    Cancer Mother 8       Breast cancer   Depression Mother    Cancer Sister    Diabetes Sister    Heart disease Sister 10       AMI as cause of death   Hyperlipidemia Sister    Hypertension Sister    Mental retardation Sister    Cancer Father  prostate cancer   Heart disease Sister 52       AMI   Diabetes Sister      Review of Systems  Constitutional: Negative.  Negative for chills and fever.  HENT: Negative.  Negative for congestion and sore throat.   Respiratory: Negative.  Negative for cough and shortness of breath.   Cardiovascular: Negative.  Negative for chest pain and palpitations.  Gastrointestinal:  Positive for heartburn. Negative for abdominal pain, blood in stool, diarrhea, melena, nausea and vomiting.  Genitourinary: Negative.  Negative for dysuria and hematuria.  Skin: Negative.  Negative for rash.  Neurological: Negative.  Negative for dizziness and headaches.  All other systems reviewed and are negative.   Vitals:   03/08/23 1111  BP: 110/64  Pulse: 85  Temp: 98.1 F (36.7 C)  SpO2: 98%    Physical Exam Vitals reviewed.  Constitutional:      Appearance: Normal appearance.  HENT:     Head: Normocephalic.     Mouth/Throat:     Mouth: Mucous membranes are moist.     Pharynx: Oropharynx is clear.  Eyes:     Extraocular Movements: Extraocular movements intact.     Pupils: Pupils are equal, round, and reactive to light.  Cardiovascular:     Rate and Rhythm: Normal rate and regular rhythm.     Pulses: Normal pulses.     Heart sounds: Normal heart sounds.  Pulmonary:     Effort: Pulmonary effort is normal.     Breath sounds: Normal breath sounds.  Abdominal:     Palpations:  Abdomen is soft.     Tenderness: There is no abdominal tenderness.  Musculoskeletal:     Cervical back: No tenderness.  Lymphadenopathy:     Cervical: No cervical adenopathy.  Skin:    General: Skin is warm and dry.     Capillary Refill: Capillary refill takes less than 2 seconds.  Neurological:     General: No focal deficit present.     Mental Status: She is alert and oriented to person, place, and time.  Psychiatric:        Mood and Affect: Mood normal.        Behavior: Behavior normal.      ASSESSMENT & PLAN: A total of 32 minutes was spent with the patient and counseling/coordination of care regarding preparing for this visit, review of chronic medical conditions and their management, review of most recent office visit notes, review of all medications, diagnosis of GERD and management with PPIs, need for follow-up with GI doctor, education on nutrition, prognosis, documentation and need for follow-up  Problem List Items Addressed This Visit       Digestive   Gastroesophageal reflux disease - Primary    Active and affecting quality of life. Diet and nutrition discussed. Recommend to start pantoprazole 40 mg daily for 2 to 4 weeks Has appointment scheduled to see GI doctor for possible upper endoscopy.      Relevant Medications   pantoprazole (PROTONIX) 40 MG tablet   Patient Instructions  Gastroesophageal Reflux Disease, Adult  Gastroesophageal reflux (GER) happens when acid from the stomach flows up into the tube that connects the mouth and the stomach (esophagus). Normally, food travels down the esophagus and stays in the stomach to be digested. With GER, food and stomach acid sometimes move back up into the esophagus. You may have a disease called gastroesophageal reflux disease (GERD) if the reflux: Happens often. Causes frequent or very bad symptoms. Causes problems  such as damage to the esophagus. When this happens, the esophagus becomes sore and swollen. Over  time, GERD can make small holes (ulcers) in the lining of the esophagus. What are the causes? This condition is caused by a problem with the muscle between the esophagus and the stomach. When this muscle is weak or not normal, it does not close properly to keep food and acid from coming back up from the stomach. The muscle can be weak because of: Tobacco use. Pregnancy. Having a certain type of hernia (hiatal hernia). Alcohol use. Certain foods and drinks, such as coffee, chocolate, onions, and peppermint. What increases the risk? Being overweight. Having a disease that affects your connective tissue. Taking NSAIDs, such a ibuprofen. What are the signs or symptoms? Heartburn. Difficult or painful swallowing. The feeling of having a lump in the throat. A bitter taste in the mouth. Bad breath. Having a lot of saliva. Having an upset or bloated stomach. Burping. Chest pain. Different conditions can cause chest pain. Make sure you see your doctor if you have chest pain. Shortness of breath or wheezing. A long-term cough or a cough at night. Wearing away of the surface of teeth (tooth enamel). Weight loss. How is this treated? Making changes to your diet. Taking medicine. Having surgery. Treatment will depend on how bad your symptoms are. Follow these instructions at home: Eating and drinking  Follow a diet as told by your doctor. You may need to avoid foods and drinks such as: Coffee and tea, with or without caffeine. Drinks that contain alcohol. Energy drinks and sports drinks. Bubbly (carbonated) drinks or sodas. Chocolate and cocoa. Peppermint and mint flavorings. Garlic and onions. Horseradish. Spicy and acidic foods. These include peppers, chili powder, curry powder, vinegar, hot sauces, and BBQ sauce. Citrus fruit juices and citrus fruits, such as oranges, lemons, and limes. Tomato-based foods. These include red sauce, chili, salsa, and pizza with red sauce. Fried and  fatty foods. These include donuts, french fries, potato chips, and high-fat dressings. High-fat meats. These include hot dogs, rib eye steak, sausage, ham, and bacon. High-fat dairy items, such as whole milk, butter, and cream cheese. Eat small meals often. Avoid eating large meals. Avoid drinking large amounts of liquid with your meals. Avoid eating meals during the 2-3 hours before bedtime. Avoid lying down right after you eat. Do not exercise right after you eat. Lifestyle  Do not smoke or use any products that contain nicotine or tobacco. If you need help quitting, ask your doctor. Try to lower your stress. If you need help doing this, ask your doctor. If you are overweight, lose an amount of weight that is healthy for you. Ask your doctor about a safe weight loss goal. General instructions Pay attention to any changes in your symptoms. Take over-the-counter and prescription medicines only as told by your doctor. Do not take aspirin, ibuprofen, or other NSAIDs unless your doctor says it is okay. Wear loose clothes. Do not wear anything tight around your waist. Raise (elevate) the head of your bed about 6 inches (15 cm). You may need to use a wedge to do this. Avoid bending over if this makes your symptoms worse. Keep all follow-up visits. Contact a doctor if: You have new symptoms. You lose weight and you do not know why. You have trouble swallowing or it hurts to swallow. You have wheezing or a cough that keeps happening. You have a hoarse voice. Your symptoms do not get better with treatment. Get  help right away if: You have sudden pain in your arms, neck, jaw, teeth, or back. You suddenly feel sweaty, dizzy, or light-headed. You have chest pain or shortness of breath. You vomit and the vomit is green, yellow, or black, or it looks like blood or coffee grounds. You faint. Your poop (stool) is red, bloody, or black. You cannot swallow, drink, or eat. These symptoms may  represent a serious problem that is an emergency. Do not wait to see if the symptoms will go away. Get medical help right away. Call your local emergency services (911 in the [place] Do not drive yourself to the hospital. Summary If a person has gastroesophageal reflux disease (GERD), food and stomach acid move back up into the esophagus and cause symptoms or problems such as damage to the esophagus. Treatment will depend on how bad your symptoms are. Follow a diet as told by your doctor. Take all medicines only as told by your doctor. This information is not intended to replace advice given to you by your health care provider. Make sure you discuss any questions you have with your health care provider. Document Revised: 06/03/2020 Document Reviewed: 06/03/2020 Elsevier Patient Education  [place], MD [facility]

## 2023-03-08 NOTE — Patient Instructions (Signed)
Gastroesophageal Reflux Disease, Adult  Gastroesophageal reflux (GER) happens when acid from the stomach flows up into the tube that connects the mouth and the stomach (esophagus). Normally, food travels down the esophagus and stays in the stomach to be digested. With GER, food and stomach acid sometimes move back up into the esophagus. You may have a disease called gastroesophageal reflux disease (GERD) if the reflux: Happens often. Causes frequent or very bad symptoms. Causes problems such as damage to the esophagus. When this happens, the esophagus becomes sore and swollen. Over time, GERD can make small holes (ulcers) in the lining of the esophagus. What are the causes? This condition is caused by a problem with the muscle between the esophagus and the stomach. When this muscle is weak or not normal, it does not close properly to keep food and acid from coming back up from the stomach. The muscle can be weak because of: Tobacco use. Pregnancy. Having a certain type of hernia (hiatal hernia). Alcohol use. Certain foods and drinks, such as coffee, chocolate, onions, and peppermint. What increases the risk? Being overweight. Having a disease that affects your connective tissue. Taking NSAIDs, such a ibuprofen. What are the signs or symptoms? Heartburn. Difficult or painful swallowing. The feeling of having a lump in the throat. A bitter taste in the mouth. Bad breath. Having a lot of saliva. Having an upset or bloated stomach. Burping. Chest pain. Different conditions can cause chest pain. Make sure you see your doctor if you have chest pain. Shortness of breath or wheezing. A long-term cough or a cough at night. Wearing away of the surface of teeth (tooth enamel). Weight loss. How is this treated? Making changes to your diet. Taking medicine. Having surgery. Treatment will depend on how bad your symptoms are. Follow these instructions at home: Eating and drinking  Follow a  diet as told by your doctor. You may need to avoid foods and drinks such as: Coffee and tea, with or without caffeine. Drinks that contain alcohol. Energy drinks and sports drinks. Bubbly (carbonated) drinks or sodas. Chocolate and cocoa. Peppermint and mint flavorings. Garlic and onions. Horseradish. Spicy and acidic foods. These include peppers, chili powder, curry powder, vinegar, hot sauces, and BBQ sauce. Citrus fruit juices and citrus fruits, such as oranges, lemons, and limes. Tomato-based foods. These include red sauce, chili, salsa, and pizza with red sauce. Fried and fatty foods. These include donuts, french fries, potato chips, and high-fat dressings. High-fat meats. These include hot dogs, rib eye steak, sausage, ham, and bacon. High-fat dairy items, such as whole milk, butter, and cream cheese. Eat small meals often. Avoid eating large meals. Avoid drinking large amounts of liquid with your meals. Avoid eating meals during the 2-3 hours before bedtime. Avoid lying down right after you eat. Do not exercise right after you eat. Lifestyle  Do not smoke or use any products that contain nicotine or tobacco. If you need help quitting, ask your doctor. Try to lower your stress. If you need help doing this, ask your doctor. If you are overweight, lose an amount of weight that is healthy for you. Ask your doctor about a safe weight loss goal. General instructions Pay attention to any changes in your symptoms. Take over-the-counter and prescription medicines only as told by your doctor. Do not take aspirin, ibuprofen, or other NSAIDs unless your doctor says it is okay. Wear loose clothes. Do not wear anything tight around your waist. Raise (elevate) the head of your bed about   6 inches (15 cm). You may need to use a wedge to do this. Avoid bending over if this makes your symptoms worse. Keep all follow-up visits. Contact a doctor if: You have new symptoms. You lose weight and you  do not know why. You have trouble swallowing or it hurts to swallow. You have wheezing or a cough that keeps happening. You have a hoarse voice. Your symptoms do not get better with treatment. Get help right away if: You have sudden pain in your arms, neck, jaw, teeth, or back. You suddenly feel sweaty, dizzy, or light-headed. You have chest pain or shortness of breath. You vomit and the vomit is green, yellow, or black, or it looks like blood or coffee grounds. You faint. Your poop (stool) is red, bloody, or black. You cannot swallow, drink, or eat. These symptoms may represent a serious problem that is an emergency. Do not wait to see if the symptoms will go away. Get medical help right away. Call your local emergency services (911 in the [place] Do not drive yourself to the hospital. Summary If a person has gastroesophageal reflux disease (GERD), food and stomach acid move back up into the esophagus and cause symptoms or problems such as damage to the esophagus. Treatment will depend on how bad your symptoms are. Follow a diet as told by your doctor. Take all medicines only as told by your doctor. This information is not intended to replace advice given to you by your health care provider. Make sure you discuss any questions you have with your health care provider. Document Revised: 06/03/2020 Document Reviewed: 06/03/2020 Elsevier Patient Education  2023 [employer]  

## 2023-03-24 ENCOUNTER — Ambulatory Visit (INDEPENDENT_AMBULATORY_CARE_PROVIDER_SITE_OTHER): Payer: PPO

## 2023-03-24 DIAGNOSIS — J309 Allergic rhinitis, unspecified: Secondary | ICD-10-CM

## 2023-03-24 DIAGNOSIS — Z803 Family history of malignant neoplasm of breast: Secondary | ICD-10-CM | POA: Diagnosis not present

## 2023-03-24 DIAGNOSIS — N644 Mastodynia: Secondary | ICD-10-CM | POA: Diagnosis not present

## 2023-03-24 LAB — HM MAMMOGRAPHY

## 2023-03-26 ENCOUNTER — Other Ambulatory Visit: Payer: Self-pay | Admitting: Gastroenterology

## 2023-03-26 DIAGNOSIS — R1013 Epigastric pain: Secondary | ICD-10-CM | POA: Diagnosis not present

## 2023-03-26 DIAGNOSIS — R1011 Right upper quadrant pain: Secondary | ICD-10-CM

## 2023-03-26 DIAGNOSIS — R110 Nausea: Secondary | ICD-10-CM

## 2023-03-26 DIAGNOSIS — R109 Unspecified abdominal pain: Secondary | ICD-10-CM

## 2023-03-26 DIAGNOSIS — K219 Gastro-esophageal reflux disease without esophagitis: Secondary | ICD-10-CM | POA: Diagnosis not present

## 2023-03-30 ENCOUNTER — Ambulatory Visit: Payer: Medicare Other | Admitting: Allergy and Immunology

## 2023-03-31 ENCOUNTER — Telehealth: Payer: Self-pay

## 2023-03-31 ENCOUNTER — Ambulatory Visit (INDEPENDENT_AMBULATORY_CARE_PROVIDER_SITE_OTHER): Payer: PPO

## 2023-03-31 DIAGNOSIS — J309 Allergic rhinitis, unspecified: Secondary | ICD-10-CM

## 2023-03-31 NOTE — Telephone Encounter (Signed)
Patient came in to get her allergy injection and informed me she wanted to stop them. Patient stated that she doesn't feel like they are helping and she is getting sick more than when she wasn't on them. Patient has signed discontinuation forms.

## 2023-04-22 ENCOUNTER — Ambulatory Visit
Admission: RE | Admit: 2023-04-22 | Discharge: 2023-04-22 | Disposition: A | Discharge status: Home / Self Care | Payer: PPO | Source: Ambulatory Visit | Attending: Gastroenterology | Admitting: Gastroenterology

## 2023-04-22 DIAGNOSIS — R110 Nausea: Secondary | ICD-10-CM | POA: Diagnosis not present

## 2023-04-22 DIAGNOSIS — R109 Unspecified abdominal pain: Secondary | ICD-10-CM

## 2023-04-22 DIAGNOSIS — R1084 Generalized abdominal pain: Secondary | ICD-10-CM | POA: Diagnosis not present

## 2023-04-22 DIAGNOSIS — R1011 Right upper quadrant pain: Secondary | ICD-10-CM

## 2023-04-27 ENCOUNTER — Other Ambulatory Visit: Payer: Self-pay | Admitting: Emergency Medicine

## 2023-04-27 DIAGNOSIS — E785 Hyperlipidemia, unspecified: Secondary | ICD-10-CM

## 2023-05-17 ENCOUNTER — Other Ambulatory Visit (HOSPITAL_COMMUNITY): Payer: Self-pay

## 2023-05-17 ENCOUNTER — Other Ambulatory Visit: Payer: Self-pay

## 2023-05-17 ENCOUNTER — Telehealth: Payer: Self-pay | Admitting: Emergency Medicine

## 2023-05-17 DIAGNOSIS — M5137 Other intervertebral disc degeneration, lumbosacral region: Secondary | ICD-10-CM

## 2023-05-17 MED ORDER — GABAPENTIN 300 MG PO CAPS
300.0000 mg | ORAL_CAPSULE | Freq: Four times a day (QID) | ORAL | 3 refills | Status: DC
Start: 2023-05-17 — End: 2024-05-30
  Filled 2023-05-17: qty 360, 90d supply, fill #0
  Filled 2023-09-07: qty 360, 90d supply, fill #1
  Filled 2023-12-15: qty 360, 90d supply, fill #2
  Filled 2024-04-10: qty 360, 90d supply, fill #3

## 2023-05-17 NOTE — Telephone Encounter (Signed)
New prescription sent to patient pharmacy.

## 2023-05-17 NOTE — Telephone Encounter (Signed)
Prescription Request  05/17/2023  LOV: 03/08/2023  What is the name of the medication or equipment?  gabapentin (NEURONTIN) 300 MG capsule  Have you contacted your pharmacy to request a refill? No   Which pharmacy would you like this sent to?  [facility]   Patient notified that their request is being sent to the clinical staff for review and that they should receive a response within 2 business days.   Please advise at Mobile [phone] (mobile)

## 2023-06-15 ENCOUNTER — Ambulatory Visit (INDEPENDENT_AMBULATORY_CARE_PROVIDER_SITE_OTHER): Payer: PPO

## 2023-06-15 VITALS — Ht 61.0 in | Wt 112.0 lb

## 2023-06-15 DIAGNOSIS — Z0000 Encounter for general adult medical examination without abnormal findings: Secondary | ICD-10-CM | POA: Diagnosis not present

## 2023-06-15 NOTE — Patient Instructions (Addendum)
Anne Mills , Thank you for taking time to come for your Medicare Wellness Visit. I appreciate your ongoing commitment to your health goals. Please review the following plan we discussed and let me know if I can assist you in the future.   These are the goals we discussed:  Goals       Patient Stated (pt-stated)       Stay healthy, keep eating good.        This is a list of the screening recommended for you and due dates:  Health Maintenance  Topic Date Due   COVID-19 Vaccine (5 - 2023-24 season) 07/01/2023*   Flu Shot  07/08/2023   Medicare Annual Wellness Visit  06/14/2024   DTaP/Tdap/Td vaccine (3 - Td or Tdap) 07/23/2025   Pneumonia Vaccine  Completed   DEXA scan (bone density measurement)  Completed   Hepatitis C Screening  Completed   Zoster (Shingles) Vaccine  Completed   HPV Vaccine  Aged Out   Colon Cancer Screening  Discontinued  *Topic was postponed. The date shown is not the original due date.    Advanced directives: Please bring a copy of your health care power of attorney and living will to the office to be added to your chart at your convenience.   Conditions/risks identified: None  Next appointment: Follow up in one year for your annual wellness visit    Preventive Care 65 Years and Older, Female Preventive care refers to lifestyle choices and visits with your health care provider that can promote health and wellness. What does preventive care include? A yearly physical exam. This is also called an annual well check. Dental exams once or twice a year. Routine eye exams. Ask your health care provider how often you should have your eyes checked. Personal lifestyle choices, including: Daily care of your teeth and gums. Regular physical activity. Eating a healthy diet. Avoiding tobacco and drug use. Limiting alcohol use. Practicing safe sex. Taking low-dose aspirin every day. Taking vitamin and mineral supplements as recommended by your health care  provider. What happens during an annual well check? The services and screenings done by your health care provider during your annual well check will depend on your age, overall health, lifestyle risk factors, and family history of disease. Counseling  Your health care provider may ask you questions about your: Alcohol use. Tobacco use. Drug use. Emotional well-being. Home and relationship well-being. Sexual activity. Eating habits. History of falls. Memory and ability to understand (cognition). Work and work Astronomer. Reproductive health. Screening  You may have the following tests or measurements: Height, weight, and BMI. Blood pressure. Lipid and cholesterol levels. These may be checked every 5 years, or more frequently if you are over 81 years old. Skin check. Lung cancer screening. You may have this screening every year starting at age 33 if you have a 30-pack-year history of smoking and currently smoke or have quit within the past 15 years. Fecal occult blood test (FOBT) of the stool. You may have this test every year starting at age 80. Flexible sigmoidoscopy or colonoscopy. You may have a sigmoidoscopy every 5 years or a colonoscopy every 10 years starting at age 75. Hepatitis C blood test. Hepatitis B blood test. Sexually transmitted disease (STD) testing. Diabetes screening. This is done by checking your blood sugar (glucose) after you have not eaten for a while (fasting). You may have this done every 1-3 years. Bone density scan. This is done to screen for osteoporosis. You may  have this done starting at age 82. Mammogram. This may be done every 1-2 years. Talk to your health care provider about how often you should have regular mammograms. Talk with your health care provider about your test results, treatment options, and if necessary, the need for more tests. Vaccines  Your health care provider may recommend certain vaccines, such as: Influenza vaccine. This is  recommended every year. Tetanus, diphtheria, and acellular pertussis (Tdap, Td) vaccine. You may need a Td booster every 10 years. Zoster vaccine. You may need this after age 28. Pneumococcal 13-valent conjugate (PCV13) vaccine. One dose is recommended after age 60. Pneumococcal polysaccharide (PPSV23) vaccine. One dose is recommended after age 24. Talk to your health care provider about which screenings and vaccines you need and how often you need them. This information is not intended to replace advice given to you by your health care provider. Make sure you discuss any questions you have with your health care provider. Document Released: 12/20/2015 Document Revised: 08/12/2016 Document Reviewed: 09/24/2015 Elsevier Interactive Patient Education  2017 [employer].  Fall Prevention in the Home Falls can cause injuries. They can happen to people of all ages. There are many things you can do to make your home safe and to help prevent falls. What can I do on the outside of my home? Regularly fix the edges of walkways and driveways and fix any cracks. Remove anything that might make you trip as you walk through a door, such as a raised step or threshold. Trim any bushes or trees on the path to your home. Use bright outdoor lighting. Clear any walking paths of anything that might make someone trip, such as rocks or tools. Regularly check to see if handrails are loose or broken. Make sure that both sides of any steps have handrails. Any raised decks and porches should have guardrails on the edges. Have any leaves, snow, or ice cleared regularly. Use sand or salt on walking paths during winter. Clean up any spills in your garage right away. This includes oil or grease spills. What can I do in the bathroom? Use night lights. Install grab bars by the toilet and in the tub and shower. Do not use towel bars as grab bars. Use non-skid mats or decals in the tub or shower. If you need to sit down in  the shower, use a plastic, non-slip stool. Keep the floor dry. Clean up any water that spills on the floor as soon as it happens. Remove soap buildup in the tub or shower regularly. Attach bath mats securely with double-sided non-slip rug tape. Do not have throw rugs and other things on the floor that can make you trip. What can I do in the bedroom? Use night lights. Make sure that you have a light by your bed that is easy to reach. Do not use any sheets or blankets that are too big for your bed. They should not hang down onto the floor. Have a firm chair that has side arms. You can use this for support while you get dressed. Do not have throw rugs and other things on the floor that can make you trip. What can I do in the kitchen? Clean up any spills right away. Avoid walking on wet floors. Keep items that you use a lot in easy-to-reach places. If you need to reach something above you, use a strong step stool that has a grab bar. Keep electrical cords out of the way. Do not use floor polish  or wax that makes floors slippery. If you must use wax, use non-skid floor wax. Do not have throw rugs and other things on the floor that can make you trip. What can I do with my stairs? Do not leave any items on the stairs. Make sure that there are handrails on both sides of the stairs and use them. Fix handrails that are broken or loose. Make sure that handrails are as long as the stairways. Check any carpeting to make sure that it is firmly attached to the stairs. Fix any carpet that is loose or worn. Avoid having throw rugs at the top or bottom of the stairs. If you do have throw rugs, attach them to the floor with carpet tape. Make sure that you have a light switch at the top of the stairs and the bottom of the stairs. If you do not have them, ask someone to add them for you. What else can I do to help prevent falls? Wear shoes that: Do not have high heels. Have rubber bottoms. Are comfortable  and fit you well. Are closed at the toe. Do not wear sandals. If you use a stepladder: Make sure that it is fully opened. Do not climb a closed stepladder. Make sure that both sides of the stepladder are locked into place. Ask someone to hold it for you, if possible. Clearly mark and make sure that you can see: Any grab bars or handrails. First and last steps. Where the edge of each step is. Use tools that help you move around (mobility aids) if they are needed. These include: Canes. Walkers. Scooters. Crutches. Turn on the lights when you go into a dark area. Replace any light bulbs as soon as they burn out. Set up your furniture so you have a clear path. Avoid moving your furniture around. If any of your floors are uneven, fix them. If there are any pets around you, be aware of where they are. Review your medicines with your doctor. Some medicines can make you feel dizzy. This can increase your chance of falling. Ask your doctor what other things that you can do to help prevent falls. This information is not intended to replace advice given to you by your health care provider. Make sure you discuss any questions you have with your health care provider. Document Released: 09/19/2009 Document Revised: 04/30/2016 Document Reviewed: 12/28/2014 Elsevier Interactive Patient Education  2017 [employer].

## 2023-06-15 NOTE — Progress Notes (Signed)
Subjective:   Anne Mills is a 77 y.o. female who presents for Medicare Annual (Subsequent) preventive examination.  Visit Complete: Virtual  I connected with  Anne Mills on 06/15/23 by a audio enabled telemedicine application and verified that I am speaking with the correct person using two identifiers.  Patient Location: Home  Provider Location: Home Office  I discussed the limitations of evaluation and management by telemedicine. The patient expressed understanding and agreed to proceed.  Patient Medicare AWV questionnaire was completed by the patient on 06/15/23; I have confirmed that all information answered by patient is correct and no changes since this date.  Review of Systems      Cardiac Risk Factors include: advanced age (>17men, >74 women);dyslipidemia     Objective:    Today's Vitals   06/15/23 1536  Weight: 112 lb (50.8 kg)  Height: 5\' 1"  (1.549 m)   Body mass index is 21.16 kg/m.     06/15/2023    3:45 PM 01/10/2020    1:10 PM 02/13/2019   11:44 AM 06/18/2014    4:53 PM 02/27/2013   11:06 AM  Advanced Directives  Does Patient Have a Medical Advance Directive? Yes No Yes Patient does not have advance directive;Patient would not like information Patient has advance directive, copy not in chart  Type of Advance Directive Healthcare Power of [place];Living will  Healthcare Power of [employer] Power of Attorney  Does patient want to make changes to medical advance directive?   No - Patient declined    Copy of Healthcare Power of Attorney in Chart? No - copy requested      Would patient like information on creating a medical advance directive?  Yes (Inpatient - patient defers creating a medical advance directive at this time - Information given)       Current Medications (verified) Outpatient Encounter Medications as of 06/15/2023  Medication Sig   B Complex Vitamins (VITAMIN B-COMPLEX PO) Take by mouth. Liquid   Calcium Carbonate-Vitamin D  (CALCIUM 600 + D PO) Take 1 tablet by mouth daily.   Camphor-Menthol-Methyl Sal 1.2-5.7-6.3 % PTCH Apply topically. (Patient not taking: Reported on 02/09/2023)   Cholecalciferol 2000 UNITS TABS Take 1 tablet by mouth daily.   clobetasol (OLUX) 0.05 % topical foam Apply topically 2 (two) times daily. (Patient not taking: Reported on 02/09/2023)   cyclobenzaprine (FLEXERIL) 5 MG tablet Take 1 tablet (5 mg total) by mouth at bedtime.   fluticasone (FLONASE) 50 MCG/ACT nasal spray Place 2 sprays into both nostrils daily.   gabapentin (NEURONTIN) 300 MG capsule Take 1 capsule (300 mg total) by mouth 4 (four) times daily.   Magnesium 500 MG CAPS Take by mouth.   Misc Natural Products (OSTEO BI-FLEX TRIPLE STRENGTH) TABS Take 2 tablets by mouth daily.   Multiple Vitamins-Minerals (STRESS TAB NF) TABS Take 1 tablet by mouth daily.   OVER THE COUNTER MEDICATION Transitions w/Hesperidin vegetarian taking   OVER THE COUNTER MEDICATION Wal-Finate Dw/Pseudoephedrine taking   OVER THE COUNTER MEDICATION Take 470 mg by mouth daily. Butcher's broom root   pantoprazole (PROTONIX) 40 MG tablet Take 1 tablet (40 mg total) by mouth daily.   Probiotic Product (PROBIOTIC PO) Take by mouth. 10 Billion Active Cultures   rosuvastatin (CRESTOR) 10 MG tablet TAKE 1 TABLET BY MOUTH EVERY DAY   Trolamine Salicylate (ASPERCREME EX) Apply 1 application topically 2 (two) times daily as needed (for back pain).   No facility-administered encounter medications on file as of  06/15/2023.    Allergies (verified) Gluten meal and Biaxin [clarithromycin]   History: Past Medical History:  Diagnosis Date   Allergy    Asthma    no inhaler   Back pain, chronic    Chest pain    under left breast- 2-3 x per week: h/o breast surgery   GERD (gastroesophageal reflux disease)    Headache(784.0)    Heart murmur    "not serious" per pt-no echo   Scoliosis    Seasonal allergies    Shortness of breath    per pt due to allergies    Past Surgical History:  Procedure Laterality Date   ABDOMINOPLASTY     BREAST SURGERY     lt cyst   COLONOSCOPY     DILATION AND CURETTAGE OF UTERUS N/A 03/03/2013   Procedure: DILATATION AND CURETTAGE;  Surgeon: Antionette Char, MD;  Location: [facility];  Service: Gynecology;  Laterality: N/A;   EXAMINATION UNDER ANESTHESIA N/A 03/03/2013   Procedure: EXAM UNDER ANESTHESIA with ultrasound;  Surgeon: Antionette Char, MD;  Location: [facility];  Service: Gynecology;  Laterality: N/A;   EYE SURGERY  10/21 ---10/7 /2021   catarct both eyes   TRIGGER FINGER RELEASE Left 06/21/2014   Procedure: LEFT THUMB TRIGGER RELEASE;  Surgeon: Tami Ribas, MD;  Location: [facility];  Service: Orthopedics;  Laterality: Left;   Family History  Problem Relation Age of Onset   Dementia Mother    Hypertension Mother    Cancer Mother 31       Breast cancer   Depression Mother    Cancer Sister    Diabetes Sister    Heart disease Sister 31       AMI as cause of death   Hyperlipidemia Sister    Hypertension Sister    Mental retardation Sister    Cancer Father        prostate cancer   Heart disease Sister 82       AMI   Diabetes Sister    Social History   Socioeconomic History   Marital status: Married    Spouse name: Not on file   Number of children: Not on file   Years of education: Not on file   Highest education level: Not on file  Occupational History   Occupation: retired    Comment: house cleaning  Tobacco Use   Smoking status: Never   Smokeless tobacco: Never  Vaping Use   Vaping Use: Never used  Substance and Sexual Activity   Alcohol use: No   Drug use: No   Sexual activity: Yes    Birth control/protection: Post-menopausal  Other Topics Concern   Not on file  Social History Narrative   Marital status: married x 41 years; second marriage; happily married.  From [place] Left [place] 50; grew up [place].      Children:  2 daughters; 8 grandchildren; 10 gg.       Lives: with husband, daughter.      Employment: retired; clean homes      Tobacco; none      Alcohol: none      Drugs: none      Exercise: sporadic in 2019.  Joined [employer].      ADLs: independent with ADLs; no assistant devices.      Advanced Directives:  FULL CODE; no prolonged measures.         Social Determinants of Health   Financial Resource Strain: Low Risk  (  06/15/2023)   Overall Financial Resource Strain (CARDIA)    Difficulty of Paying Living Expenses: Not hard at all  Food Insecurity: No Food Insecurity (06/15/2023)   Hunger Vital Sign    Worried About Running Out of Food in the Last Year: Never true    Ran Out of Food in the Last Year: Never true  Transportation Needs: No Transportation Needs (06/15/2023)   PRAPARE - Administrator, Civil Service (Medical): No    Lack of Transportation (Non-Medical): No  Physical Activity: Sufficiently Active (06/15/2023)   Exercise Vital Sign    Days of Exercise per Week: 2 days    Minutes of Exercise per Session: 120 min  Stress: No Stress Concern Present (06/15/2023)   [employer] of Occupational Health - Occupational Stress Questionnaire    Feeling of Stress : Not at all  Social Connections: Socially Integrated (06/15/2023)   Social Connection and Isolation Panel [NHANES]    Frequency of Communication with Friends and Family: More than three times a week    Frequency of Social Gatherings with Friends and Family: More than three times a week    Attends Religious Services: More than 4 times per year    Active Member of [employer] or Organizations: Yes    Attends Engineer, structural: More than 4 times per year    Marital Status: Married    Tobacco Counseling Counseling given: Not Answered   Clinical Intake:  Pre-visit preparation completed: Yes  Pain : No/denies pain     BMI - recorded: 21.16 Nutritional Status: BMI of 19-24  Normal Nutritional Risks: None Diabetes: No  How often do you need to  have someone help you when you read instructions, pamphlets, or other written materials from your doctor or pharmacy?: 1 - Never  Interpreter Needed?: No  Information entered by :: Theresa Mulligan LPN   Activities of Daily Living    06/15/2023    3:43 PM 06/15/2023    3:11 PM  In your present state of health, do you have any difficulty performing the following activities:  Hearing? 0 0  Vision? 0 0  Difficulty concentrating or making decisions? 0 0  Walking or climbing stairs? 0 0  Dressing or bathing? 0 0  Doing errands, shopping? 0 0  Preparing Food and eating ? N N  Using the Toilet? N N  In the past six months, have you accidently leaked urine? N N  Do you have problems with loss of bowel control? N N  Managing your Medications? N N  Managing your Finances? N N  Housekeeping or managing your Housekeeping? N Y    Patient Care Team: Georgina Quint, MD as PCP - General (Internal Medicine)  Indicate any recent Medical Services you may have received from other than [facility] providers in the past year (date may be approximate).     Assessment:   This is a routine wellness examination for Constantina.  Hearing/Vision screen Hearing Screening - Comments:: Denies hearing difficulties   Vision Screening - Comments:: Wears rx glasses - up to date with routine eye exams with  [facility]  Dietary issues and exercise activities discussed:     Goals Addressed               This Visit's Progress     Patient Stated (pt-stated)         Stay healthy, keep eating good.       Depression Screen    06/15/2023  3:41 PM 03/08/2023   11:14 AM 09/24/2022    1:58 PM 02/10/2022    1:35 PM 01/12/2022    3:40 PM 10/07/2020    1:48 PM 03/12/2020   11:34 AM  PHQ 2/9 Scores  PHQ - 2 Score 0 0 0 0 0 0 0    Fall Risk    06/15/2023    3:43 PM 06/15/2023    3:11 PM 03/08/2023   11:13 AM 09/24/2022    1:58 PM 02/10/2022    1:35 PM  Fall Risk   Falls in the past year? 1 1 1  0 0  Number  falls in past yr: 0 0 0 0   Injury with Fall? 0 1 1 0   Risk for fall due to : No Fall Risks  No Fall Risks No Fall Risks   Follow up Falls prevention discussed  Falls evaluation completed Falls evaluation completed     MEDICARE RISK AT HOME:  Medicare Risk at Home - 06/15/23 1549     Any stairs in or around the home? Yes    If so, are there any without handrails? No    Home free of loose throw rugs in walkways, pet beds, electrical cords, etc? Yes    Adequate lighting in your home to reduce risk of falls? Yes    Life alert? No    Use of a cane, walker or w/c? No    Grab bars in the bathroom? Yes    Shower chair or bench in shower? Yes    Elevated toilet seat or a handicapped toilet? No             TIMED UP AND GO:  Was the test performed?  No    Cognitive Function:        06/15/2023    3:46 PM 01/10/2020    1:13 PM  6CIT Screen  What Year? 0 points 0 points  What month? 0 points 0 points  What time? 0 points 0 points  Count back from 20 0 points 0 points  Months in reverse 0 points 0 points  Repeat phrase 2 points 0 points  Total Score 2 points 0 points    Immunizations Immunization History  Administered Date(s) Administered   Fluad Quad(high Dose 65+) 10/02/2019, 10/07/2020, 09/24/2022   Influenza, High Dose Seasonal PF 10/17/2018   Influenza,inj,Quad PF,6+ Mos 08/22/2014, 10/06/2016, 09/15/2017   Influenza,inj,quad, With Preservative 10/30/2015, 09/06/2020   Influenza-Unspecified 10/06/2016   PFIZER(Purple Top)SARS-COV-2 Vaccination 12/29/2019, 01/19/2020, 09/06/2020, 08/07/2021   Pneumococcal Conjugate-13 07/24/2015   Pneumococcal Polysaccharide-23 07/24/2015, 09/15/2017   Td 07/24/2015   Td (Adult), 2 Lf Tetanus Toxid, Preservative Free 07/24/2015   Tdap 07/24/2015   Zoster Recombinant(Shingrix) 02/10/2022, 09/14/2022   Zoster, Live 12/08/2007    TDAP status: Up to date  Flu Vaccine status: Up to date  Pneumococcal vaccine status: Up to  date  Covid-19 vaccine status: Completed vaccines  Qualifies for Shingles Vaccine? Yes   Zostavax completed Yes   Shingrix Completed?: Yes  Screening Tests Health Maintenance  Topic Date Due   COVID-19 Vaccine (5 - 2023-24 season) 07/01/2023 (Originally 08/07/2022)   INFLUENZA VACCINE  07/08/2023   Medicare Annual Wellness (AWV)  06/14/2024   DTaP/Tdap/Td (3 - Td or Tdap) 07/23/2025   Pneumonia Vaccine 69+ Years old  Completed   DEXA SCAN  Completed   Hepatitis C Screening  Completed   Zoster Vaccines- Shingrix  Completed   HPV VACCINES  Aged Out   Colonoscopy  Discontinued  Health Maintenance  There are no preventive care reminders to display for this patient.   Colorectal cancer screening: No longer required.   Mammogram status: No longer required due to Age.  Bone Density status: Completed 06/11/20. Results reflect: Bone density results: OSTEOPOROSIS. Repeat every   years.  Lung Cancer Screening: (Low Dose CT Chest recommended if Age 28-80 years, 20 pack-year currently smoking OR have quit w/in 15years.) does not qualify.     Additional Screening:  Hepatitis C Screening: does qualify; Completed 07/01/17  Vision Screening: Recommended annual ophthalmology exams for early detection of glaucoma and other disorders of the eye. Is the patient up to date with their annual eye exam?  Yes  Who is the provider or what is the name of the office in which the patient attends annual eye exams? [facility] If pt is not established with a provider, would they like to be referred to a provider to establish care? No .   Dental Screening: Recommended annual dental exams for proper oral hygiene    Community Resource Referral / Chronic Care Management:  CRR required this visit?  No   CCM required this visit?  No     Plan:     I have personally reviewed and noted the following in the patient's chart:   Medical and social history Use of alcohol, tobacco or illicit  drugs  Current medications and supplements including opioid prescriptions. Patient is not currently taking opioid prescriptions. Functional ability and status Nutritional status Physical activity Advanced directives List of other physicians Hospitalizations, surgeries, and ER visits in previous 12 months Vitals Screenings to include cognitive, depression, and falls Referrals and appointments  In addition, I have reviewed and discussed with patient certain preventive protocols, quality metrics, and best practice recommendations. A written personalized care plan for preventive services as well as general preventive health recommendations were provided to patient.     Tillie Rung, LPN   08/14/1190   After Visit Summary: (MyChart) Due to this being a telephonic visit, the after visit summary with patients personalized plan was offered to patient via MyChart   Nurse Notes: None

## 2023-06-16 ENCOUNTER — Encounter: Payer: Self-pay | Admitting: Emergency Medicine

## 2023-06-16 ENCOUNTER — Ambulatory Visit (INDEPENDENT_AMBULATORY_CARE_PROVIDER_SITE_OTHER): Payer: PPO | Admitting: Emergency Medicine

## 2023-06-16 VITALS — BP 106/72 | HR 65 | Temp 98.3°F | Ht 61.0 in | Wt 110.5 lb

## 2023-06-16 DIAGNOSIS — Z130 Encounter for screening for diseases of the blood and blood-forming organs and certain disorders involving the immune mechanism: Secondary | ICD-10-CM

## 2023-06-16 DIAGNOSIS — Z13228 Encounter for screening for other metabolic disorders: Secondary | ICD-10-CM

## 2023-06-16 DIAGNOSIS — Z1329 Encounter for screening for other suspected endocrine disorder: Secondary | ICD-10-CM

## 2023-06-16 DIAGNOSIS — E785 Hyperlipidemia, unspecified: Secondary | ICD-10-CM

## 2023-06-16 DIAGNOSIS — Z13220 Encounter for screening for lipoid disorders: Secondary | ICD-10-CM

## 2023-06-16 DIAGNOSIS — Z0000 Encounter for general adult medical examination without abnormal findings: Secondary | ICD-10-CM | POA: Diagnosis not present

## 2023-06-16 LAB — COMPREHENSIVE METABOLIC PANEL
ALT: 22 U/L (ref 0–35)
AST: 29 U/L (ref 0–37)
Albumin: 4.2 g/dL (ref 3.5–5.2)
Alkaline Phosphatase: 95 U/L (ref 39–117)
BUN: 15 mg/dL (ref 6–23)
CO2: 30 mEq/L (ref 19–32)
Calcium: 9.5 mg/dL (ref 8.4–10.5)
Chloride: 104 mEq/L (ref 96–112)
Creatinine, Ser: 0.72 mg/dL (ref 0.40–1.20)
GFR: 81.29 mL/min (ref >60.00)
Glucose, Bld: 95 mg/dL (ref 70–99)
Potassium: 4.3 mEq/L (ref 3.5–5.1)
Sodium: 140 mEq/L (ref 135–145)
Total Bilirubin: 0.6 mg/dL (ref 0.2–1.2)
Total Protein: 6.9 g/dL (ref 6.0–8.3)

## 2023-06-16 LAB — HEMOGLOBIN A1C: Hgb A1c MFr Bld: 5.8 % (ref 4.6–6.5)

## 2023-06-16 LAB — LIPID PANEL
Cholesterol: 136 mg/dL (ref 0–200)
HDL: 61.9 mg/dL (ref >39.00)
LDL Cholesterol: 46 mg/dL (ref 0–99)
NonHDL: 74.36
Total CHOL/HDL Ratio: 2
Triglycerides: 141 mg/dL (ref 0.0–149.0)
VLDL: 28.2 mg/dL (ref 0.0–40.0)

## 2023-06-16 LAB — CBC WITH DIFFERENTIAL/PLATELET
Basophils Absolute: 0.1 10*3/uL (ref 0.0–0.1)
Basophils Relative: 1.2 % (ref 0.0–3.0)
Eosinophils Absolute: 0.1 10*3/uL (ref 0.0–0.7)
Eosinophils Relative: 1.4 % (ref 0.0–5.0)
HCT: 40.9 % (ref 36.0–46.0)
Hemoglobin: 13.6 g/dL (ref 12.0–15.0)
Lymphocytes Relative: 24.8 % (ref 12.0–46.0)
Lymphs Abs: 1.3 10*3/uL (ref 0.7–4.0)
MCHC: 33.2 g/dL (ref 30.0–36.0)
MCV: 90 fl (ref 78.0–100.0)
Monocytes Absolute: 0.4 10*3/uL (ref 0.1–1.0)
Monocytes Relative: 7.7 % (ref 3.0–12.0)
Neutro Abs: 3.5 10*3/uL (ref 1.4–7.7)
Neutrophils Relative %: 64.9 % (ref 43.0–77.0)
Platelets: 245 10*3/uL (ref 150.0–400.0)
RBC: 4.54 Mil/uL (ref 3.87–5.11)
RDW: 13.1 % (ref 11.5–15.5)
WBC: 5.4 10*3/uL (ref 4.0–10.5)

## 2023-06-16 NOTE — Progress Notes (Signed)
Anne Mills 77 y.o.   Chief Complaint  Patient presents with   Annual Exam    No concerns    HISTORY OF PRESENT ILLNESS: This is a 77 y.o. female here for annual exam. Overall doing well. History of dyslipidemia on rosuvastatin Has no complaints or any other medical concerns today.  HPI   Prior to Admission medications   Medication Sig Start Date End Date Taking? Authorizing Provider  B Complex Vitamins (VITAMIN B-COMPLEX PO) Take by mouth. Liquid   Yes [provider]  Calcium Carbonate-Vitamin D (CALCIUM 600 + D PO) Take 1 tablet by mouth daily.   Yes [provider]  Cholecalciferol 2000 UNITS TABS Take 1 tablet by mouth daily.   Yes [provider]  cyclobenzaprine (FLEXERIL) 5 MG tablet Take 1 tablet (5 mg total) by mouth at bedtime. 10/10/21  Yes   fluticasone (FLONASE) 50 MCG/ACT nasal spray Place 2 sprays into both nostrils daily. 02/09/23  Yes Kozlow, Alvira Philips, MD  gabapentin (NEURONTIN) 300 MG capsule Take 1 capsule (300 mg total) by mouth 4 (four) times daily. 05/17/23 05/11/24 Yes Gayle Collard, Eilleen Kempf, MD  Magnesium 500 MG CAPS Take by mouth.   Yes [provider]  Misc Natural Products (OSTEO BI-FLEX TRIPLE STRENGTH) TABS Take 2 tablets by mouth daily.   Yes [provider]  Multiple Vitamins-Minerals (STRESS TAB NF) TABS Take 1 tablet by mouth daily.   Yes [provider]  OVER THE COUNTER MEDICATION Transitions w/Hesperidin vegetarian taking   Yes [provider]  OVER THE COUNTER MEDICATION Wal-Finate Dw/Pseudoephedrine taking   Yes [provider]  OVER THE COUNTER MEDICATION Take 470 mg by mouth daily. Butcher's broom root   Yes [provider]  Probiotic Product (PROBIOTIC PO) Take by mouth. 10 Billion Active Cultures   Yes [provider]  rosuvastatin (CRESTOR) 10 MG tablet TAKE 1 TABLET BY MOUTH EVERY DAY 04/27/23  Yes Arlo Butt, Eilleen Kempf, MD  Trolamine Salicylate  (ASPERCREME EX) Apply 1 application topically 2 (two) times daily as needed (for back pain).   Yes [provider]  Camphor-Menthol-Methyl Sal 1.2-5.7-6.3 % PTCH Apply topically. Patient not taking: Reported on 02/09/2023    [provider]  clobetasol (OLUX) 0.05 % topical foam Apply topically 2 (two) times daily. Patient not taking: Reported on 02/09/2023 05/20/22   Janalyn Harder, MD  pantoprazole (PROTONIX) 40 MG tablet Take 1 tablet (40 mg total) by mouth daily. Patient not taking: Reported on 06/16/2023 03/08/23   Georgina Quint, MD    Allergies  Allergen Reactions   Gluten Meal Cough   Biaxin [Clarithromycin] Nausea And Vomiting    Patient Active Problem List   Diagnosis Date Noted   Dyslipidemia 01/13/2022   DDD (degenerative disc disease), cervical 06/01/2019   Degeneration of lumbar or lumbosacral intervertebral disc 06/01/2019   Asthma 12/13/2017   Gastroesophageal reflux disease 12/13/2017   Scoliosis (and kyphoscoliosis), idiopathic 09/15/2017   Cervical stenosis (uterine cervix) 03/03/2013    Past Medical History:  Diagnosis Date   Allergy    Asthma    no inhaler   Back pain, chronic    Chest pain    under left breast- 2-3 x per week: h/o breast surgery   GERD (gastroesophageal reflux disease)    Headache(784.0)    Heart murmur    "not serious" per pt-no echo   Scoliosis    Seasonal allergies    Shortness of breath    per pt due to  allergies    Past Surgical History:  Procedure Laterality Date   ABDOMINOPLASTY     BREAST SURGERY     lt cyst   COLONOSCOPY     DILATION AND CURETTAGE OF UTERUS N/A 03/03/2013   Procedure: DILATATION AND CURETTAGE;  Surgeon: Antionette Char, MD;  Location: [facility];  Service: Gynecology;  Laterality: N/A;   EXAMINATION UNDER ANESTHESIA N/A 03/03/2013   Procedure: EXAM UNDER ANESTHESIA with ultrasound;  Surgeon: Antionette Char, MD;  Location: [facility];  Service: Gynecology;  Laterality: N/A;   EYE  SURGERY  10/21 ---10/7 /2021   catarct both eyes   TRIGGER FINGER RELEASE Left 06/21/2014   Procedure: LEFT THUMB TRIGGER RELEASE;  Surgeon: Tami Ribas, MD;  Location: [facility];  Service: Orthopedics;  Laterality: Left;    Social History   Socioeconomic History   Marital status: Married    Spouse name: Not on file   Number of children: Not on file   Years of education: Not on file   Highest education level: Not on file  Occupational History   Occupation: retired    Comment: house cleaning  Tobacco Use   Smoking status: Never   Smokeless tobacco: Never  Vaping Use   Vaping Use: Never used  Substance and Sexual Activity   Alcohol use: No   Drug use: No   Sexual activity: Yes    Birth control/protection: Post-menopausal  Other Topics Concern   Not on file  Social History Narrative   Marital status: married x 41 years; second marriage; happily married.  From [place] Left [place] age 10; grew up [place].      Children:  2 daughters; 8 grandchildren; 10 gg.      Lives: with husband, daughter.      Employment: retired; clean homes      Tobacco; none      Alcohol: none      Drugs: none      Exercise: sporadic in 2019.  Joined [employer].      ADLs: independent with ADLs; no assistant devices.      Advanced Directives:  FULL CODE; no prolonged measures.         Social Determinants of Health   Financial Resource Strain: Low Risk  (06/15/2023)   Overall Financial Resource Strain (CARDIA)    Difficulty of Paying Living Expenses: Not hard at all  Food Insecurity: No Food Insecurity (06/15/2023)   Hunger Vital Sign    Worried About Running Out of Food in the Last Year: Never true    Ran Out of Food in the Last Year: Never true  Transportation Needs: No Transportation Needs (06/15/2023)   PRAPARE - Administrator, Civil Service (Medical): No    Lack of Transportation (Non-Medical): No  Physical Activity: Sufficiently Active (06/15/2023)   Exercise Vital  Sign    Days of Exercise per Week: 2 days    Minutes of Exercise per Session: 120 min  Stress: No Stress Concern Present (06/15/2023)   [employer] of Occupational Health - Occupational Stress Questionnaire    Feeling of Stress : Not at all  Social Connections: Socially Integrated (06/15/2023)   Social Connection and Isolation Panel [NHANES]    Frequency of Communication with Friends and Family: More than three times a week    Frequency of Social Gatherings with Friends and Family: More than three times a week    Attends Religious Services: More than 4 times per year  Active Member of Clubs or Organizations: Yes    Attends Banker Meetings: More than 4 times per year    Marital Status: Married  Catering manager Violence: Not At Risk (06/15/2023)   Humiliation, Afraid, Rape, and Kick questionnaire    Fear of Current or Ex-Partner: No    Emotionally Abused: No    Physically Abused: No    Sexually Abused: No    Family History  Problem Relation Age of Onset   Dementia Mother    Hypertension Mother    Cancer Mother 61       Breast cancer   Depression Mother    Cancer Sister    Diabetes Sister    Heart disease Sister 68       AMI as cause of death   Hyperlipidemia Sister    Hypertension Sister    Mental retardation Sister    Cancer Father        prostate cancer   Heart disease Sister 12       AMI   Diabetes Sister      Review of Systems  Constitutional: Negative.  Negative for chills and fever.  HENT: Negative.  Negative for congestion and sore throat.   Respiratory: Negative.  Negative for cough and shortness of breath.   Cardiovascular: Negative.  Negative for chest pain and palpitations.  Gastrointestinal:  Negative for abdominal pain, diarrhea, nausea and vomiting.  Genitourinary: Negative.  Negative for dysuria and hematuria.  Skin: Negative.  Negative for rash.  Neurological: Negative.  Negative for dizziness and headaches.  All other systems  reviewed and are negative.   Vitals:   06/16/23 1520  BP: 106/72  Pulse: 65  Temp: 98.3 F (36.8 C)  SpO2: 96%    Physical Exam Vitals reviewed.  Constitutional:      Appearance: Normal appearance.  HENT:     Head: Normocephalic.     Right Ear: Tympanic membrane, ear canal and external ear normal.     Left Ear: Tympanic membrane, ear canal and external ear normal.     Mouth/Throat:     Mouth: Mucous membranes are moist.     Pharynx: Oropharynx is clear.  Eyes:     Extraocular Movements: Extraocular movements intact.     Conjunctiva/sclera: Conjunctivae normal.     Pupils: Pupils are equal, round, and reactive to light.  Cardiovascular:     Rate and Rhythm: Normal rate and regular rhythm.     Pulses: Normal pulses.     Heart sounds: Normal heart sounds.  Pulmonary:     Effort: Pulmonary effort is normal.     Breath sounds: Normal breath sounds.  Abdominal:     Palpations: Abdomen is soft.     Tenderness: There is no abdominal tenderness.  Musculoskeletal:     Cervical back: No tenderness.  Lymphadenopathy:     Cervical: No cervical adenopathy.  Skin:    General: Skin is warm and dry.     Capillary Refill: Capillary refill takes less than 2 seconds.  Neurological:     General: No focal deficit present.     Mental Status: She is alert and oriented to person, place, and time.  Psychiatric:        Mood and Affect: Mood normal.        Behavior: Behavior normal.      ASSESSMENT & PLAN: Problem List Items Addressed This Visit       Other   Dyslipidemia   Relevant Orders  Comprehensive metabolic panel   Lipid panel   Other Visit Diagnoses     Routine general medical examination at a health care facility    -  Primary   Relevant Orders   CBC with Differential   Comprehensive metabolic panel   Hemoglobin A1c   Lipid panel   Screening for deficiency anemia       Relevant Orders   CBC with Differential   Screening for lipoid disorders       Relevant  Orders   Lipid panel   Screening for endocrine, metabolic and immunity disorder       Relevant Orders   Comprehensive metabolic panel   Hemoglobin A1c      Modifiable risk factors discussed with patient. Anticipatory guidance according to age provided. The following topics were also discussed: Social Determinants of Health Smoking.  Non-smoker Diet and nutrition Benefits of exercise Cancer screening and review of most recent mammogram and colonoscopy reports Vaccinations reviewed and recommendations Cardiovascular risk assessment and need for blood work The 10-year ASCVD risk score (Arnett DK, et al., 2019) is: 12.8%   Values used to calculate the score:     Age: 65 years     Sex: Female     Is Non-Hispanic African American: No     Diabetic: No     Tobacco smoker: No     Systolic Blood Pressure: 106 mmHg     Is BP treated: No     HDL Cholesterol: 60.1 mg/dL     Total Cholesterol: 207 mg/dL  Mental health including depression and anxiety Fall and accident prevention  Patient Instructions  Health Maintenance, Female Adopting a healthy lifestyle and getting preventive care are important in promoting health and wellness. Ask your health care provider about: The right schedule for you to have regular tests and exams. Things you can do on your own to prevent diseases and keep yourself healthy. What should I know about diet, weight, and exercise? Eat a healthy diet  Eat a diet that includes plenty of vegetables, fruits, low-fat dairy products, and lean protein. Do not eat a lot of foods that are high in solid fats, added sugars, or sodium. Maintain a healthy weight Body mass index (BMI) is used to identify weight problems. It estimates body fat based on height and weight. Your health care provider can help determine your BMI and help you achieve or maintain a healthy weight. Get regular exercise Get regular exercise. This is one of the most important things you can do for your  health. Most adults should: Exercise for at least 150 minutes each week. The exercise should increase your heart rate and make you sweat (moderate-intensity exercise). Do strengthening exercises at least twice a week. This is in addition to the moderate-intensity exercise. Spend less time sitting. Even light physical activity can be beneficial. Watch cholesterol and blood lipids Have your blood tested for lipids and cholesterol at 76 years of age, then have this test every 5 years. Have your cholesterol levels checked more often if: Your lipid or cholesterol levels are high. You are older than 76 years of age. You are at high risk for heart disease. What should I know about cancer screening? Depending on your health history and family history, you may need to have cancer screening at various ages. This may include screening for: Breast cancer. Cervical cancer. Colorectal cancer. Skin cancer. Lung cancer. What should I know about heart disease, diabetes, and high blood pressure? Blood pressure and heart disease  High blood pressure causes heart disease and increases the risk of stroke. This is more likely to develop in people who have high blood pressure readings or are overweight. Have your blood pressure checked: Every 3-5 years if you are 68-43 years of age. Every year if you are 34 years old or older. Diabetes Have regular diabetes screenings. This checks your fasting blood sugar level. Have the screening done: Once every three years after age 16 if you are at a normal weight and have a low risk for diabetes. More often and at a younger age if you are overweight or have a high risk for diabetes. What should I know about preventing infection? Hepatitis B If you have a higher risk for hepatitis B, you should be screened for this virus. Talk with your health care provider to find out if you are at risk for hepatitis B infection. Hepatitis C Testing is recommended for: Everyone born  from [birth year] through 1964. Anyone with known risk factors for hepatitis C. Sexually transmitted infections (STIs) Get screened for STIs, including gonorrhea and chlamydia, if: You are sexually active and are younger than 76 years of age. You are older than 76 years of age and your health care provider tells you that you are at risk for this type of infection. Your sexual activity has changed since you were last screened, and you are at increased risk for chlamydia or gonorrhea. Ask your health care provider if you are at risk. Ask your health care provider about whether you are at high risk for HIV. Your health care provider may recommend a prescription medicine to help prevent HIV infection. If you choose to take medicine to prevent HIV, you should first get tested for HIV. You should then be tested every 3 months for as long as you are taking the medicine. Pregnancy If you are about to stop having your period (premenopausal) and you may become pregnant, seek counseling before you get pregnant. Take 400 to 800 micrograms (mcg) of folic acid every day if you become pregnant. Ask for birth control (contraception) if you want to prevent pregnancy. Osteoporosis and menopause Osteoporosis is a disease in which the bones lose minerals and strength with aging. This can result in bone fractures. If you are 18 years old or older, or if you are at risk for osteoporosis and fractures, ask your health care provider if you should: Be screened for bone loss. Take a calcium or vitamin D supplement to lower your risk of fractures. Be given hormone replacement therapy (HRT) to treat symptoms of menopause. Follow these instructions at home: Alcohol use Do not drink alcohol if: Your health care provider tells you not to drink. You are pregnant, may be pregnant, or are planning to become pregnant. If you drink alcohol: Limit how much you have to: 0-1 drink a day. Know how much alcohol is in your drink. In the  [place] one drink equals one 12 oz bottle of beer (355 mL), one 5 oz glass of wine (148 mL), or one 1 oz glass of hard liquor (44 mL). Lifestyle Do not use any products that contain nicotine or tobacco. These products include cigarettes, chewing tobacco, and vaping devices, such as e-cigarettes. If you need help quitting, ask your health care provider. Do not use street drugs. Do not share needles. Ask your health care provider for help if you need support or information about quitting drugs. General instructions Schedule regular health, dental, and eye exams. Stay current with your vaccines.  Tell your health care provider if: You often feel depressed. You have ever been abused or do not feel safe at home. Summary Adopting a healthy lifestyle and getting preventive care are important in promoting health and wellness. Follow your health care provider's instructions about healthy diet, exercising, and getting tested or screened for diseases. Follow your health care provider's instructions on monitoring your cholesterol and blood pressure. This information is not intended to replace advice given to you by your health care provider. Make sure you discuss any questions you have with your health care provider. Document Revised: 04/14/2021 Document Reviewed: 04/14/2021 Elsevier Patient Education  2024 Elsevier Inc.     Edwina Barth, MD [facility]

## 2023-06-16 NOTE — Patient Instructions (Signed)

## 2023-06-23 ENCOUNTER — Encounter: Payer: PPO | Admitting: Emergency Medicine

## 2023-07-19 ENCOUNTER — Other Ambulatory Visit: Payer: Self-pay

## 2023-07-19 ENCOUNTER — Encounter: Payer: Self-pay | Admitting: Internal Medicine

## 2023-07-19 ENCOUNTER — Ambulatory Visit: Payer: PPO | Admitting: Internal Medicine

## 2023-07-19 VITALS — BP 92/64 | HR 78 | Temp 98.9°F | Resp 16

## 2023-07-19 DIAGNOSIS — J3089 Other allergic rhinitis: Secondary | ICD-10-CM

## 2023-07-19 DIAGNOSIS — R0982 Postnasal drip: Secondary | ICD-10-CM

## 2023-07-19 DIAGNOSIS — U071 COVID-19: Secondary | ICD-10-CM

## 2023-07-19 MED ORDER — METHYLPREDNISOLONE ACETATE 40 MG/ML IJ SUSP
40.0000 mg | Freq: Once | INTRAMUSCULAR | Status: AC
Start: 2023-07-19 — End: 2023-07-19
  Administered 2023-07-19: 40 mg via INTRAMUSCULAR

## 2023-07-19 MED ORDER — PREDNISONE 10 MG PO TABS: ORAL_TABLET | ORAL | 0 refills | Status: DC

## 2023-07-19 NOTE — Progress Notes (Signed)
FOLLOW UP Date of Service/Encounter:  07/19/23  Subjective:  Anne Mills (DOB: 09/23/1946) is a 77 y.o. female who [facility] in re-evaluation of the following: nasal congestion,  History obtained from: chart review and patient.  For Review, LV was on 02/11/23  with Dr. Delorse Lek seen for acute visit for sinusitis . See below for summary of history and diagnostics.   Therapeutic plans/changes recommended: She was started on Augmentin x 10 days and given bromfed for cough. Instructed to complete a prednisone course previously given.   Today presents for acute sick visit. She was positive for Covid 2 weeks ago, has now tested negative as of yesterday. However, continues with head congestion, mucus that drains down her throat and coughing due to mucus production which have been lingering. She was taking chlorpheniramine and nasal contestants last week - only 3 days together. She did not take any monoclonals for COVID.  Her grandson did take one, but he developed acid reflux and she already has severe acid reflux, so decided not to take it. She feels her symptoms are improving, but she continues with the phlegm. She last needed prednisone in March of this year. This was also the last time she was sick. She is using flonase and saline solution at night every night.  She was receiving allergy injections for mold only. Skin testing from 2019 positive to molds, lab work negative to environmental panel. She does not feel AIT was helpful. She has read about SLIT and is interested in seeing if this would work for her. She would like to start it for dust mites or ragweed, but we reviewed that her previous allergy testing was negative to those allergens.  Chart Review: Called to inform of decision to stop allergy injections on 03/31/23. Felt they were not helping and she was getting sick more often.  All medications reviewed by clinical staff and  updated in chart. No new pertinent medical or surgical history except as noted in HPI.  ROS: All others negative except as noted per HPI.   Objective:  BP 92/64 (BP Location: Left Arm, Patient Position: Sitting, Cuff Size: Normal)   Pulse 78   Temp 98.9 F (37.2 C) (Temporal)   Resp 16   SpO2 97%  There is no height or weight on file to calculate BMI. Physical Exam: General Appearance:  Alert, cooperative, no distress, appears stated age  Head:  Normocephalic, without obvious abnormality, atraumatic  Eyes:  Conjunctiva clear, EOM's intact  Ears EACs normal bilaterally and normal TMs bilaterally  Nose: Nares normal, hypertrophic turbinates, normal mucosa, and no visible anterior polyps  Throat: Lips, tongue normal; teeth and gums normal, normal posterior oropharynx and + cobblestoning  Neck: Supple, symmetrical  Lungs:   clear to auscultation bilaterally, Respirations unlabored, no coughing  Heart:  regular rate and rhythm and no murmur, Appears well perfused  Extremities: No edema  Skin: Skin color, texture, turgor normal and no rashes or lesions on visualized portions of skin  Neurologic: No gross deficits   Labs:  Lab Orders         Allergens, Zone 2     Assessment/Plan   Patient having significant drainage following COVID-19 infection (postinfectious nonallergic rhinitis).  Discussed that postinfection is current cause of symptoms, and her chronic typical rhinitis symptoms are likely more related to irritant as well as vasomotor rhinitis (nonallergic).  Her last allergy skin testing from 2019 was not impressive, and blood work  was negative at that time.  Will repeat labs for environmental panel today, but if testing is again negative she will not be a candidate for slit.  1.  Use OTC Flonase -1 spray each nostril 1-7 times per week if needed with saline rinses nightly.  2. 40 mg IM depomedrol in clinic  3. Tomorrow start oral prednisone 20 mg daily for 3 days then 10 mg on  day 4.  4. Updated environmental allergy labs today via blood work to see if qualifies for allergy drops.  Follow up : we will contact you with your lab results, 12 month follow-up, sooner if needed  It was a pleasure meeting you in clinic today! Thank you for allowing me to participate in your care.  Other: none  Tonny Bollman, MD  [facility]

## 2023-07-19 NOTE — Patient Instructions (Addendum)
1.  Use OTC Flonase -1 spray each nostril 1-7 times per week if needed with saline rinses nightly.  2. 40 mg IM depomedrol in clinic  3. Tomorrow start oral prednisone 20 mg daily for 3 days then 10 mg on day 4.  4. Updated environmental allergy labs today via blood work to see if qualifies for allergy drops.  Follow up : we will contact you with your lab results, 12 month follow-up, sooner if needed  It was a pleasure  meeting you  in clinic today! Thank you for allowing me to participate in your care.  Tonny Bollman, MD [facility]

## 2023-07-23 LAB — ALLERGENS, ZONE 2
Cedar, Mountain IgE: <0.1 kU/L
Dog Dander IgE: <0.1 kU/L
Elm, American IgE: <0.1 kU/L

## 2023-08-10 ENCOUNTER — Other Ambulatory Visit: Payer: Self-pay

## 2023-08-10 ENCOUNTER — Ambulatory Visit: Payer: PPO | Admitting: Allergy & Immunology

## 2023-08-10 ENCOUNTER — Encounter: Payer: Self-pay | Admitting: Allergy & Immunology

## 2023-08-10 VITALS — BP 120/76 | HR 98 | Temp 98.4°F | Resp 16 | Ht 60.0 in | Wt 108.9 lb

## 2023-08-10 DIAGNOSIS — J3089 Other allergic rhinitis: Secondary | ICD-10-CM | POA: Diagnosis not present

## 2023-08-10 DIAGNOSIS — J0101 Acute recurrent maxillary sinusitis: Secondary | ICD-10-CM | POA: Diagnosis not present

## 2023-08-10 DIAGNOSIS — J329 Chronic sinusitis, unspecified: Secondary | ICD-10-CM

## 2023-08-10 MED ORDER — AZITHROMYCIN 250 MG PO TABS: ORAL_TABLET | ORAL | 0 refills | Status: DC

## 2023-08-10 NOTE — Progress Notes (Signed)
FOLLOW UP  Date of Service/Encounter:  08/10/23   Assessment:   Recent COVID-19 infection in early August  Acute sinusitis - adding an antibiotic today  Plan/Recommendations:    Acute sinusitis - Add on azithromycin two tablets on the first day and then one tablet daily for four more days. - I am not convinced that this will work since this is not my first line treatment for sinus infections, but we will try it.  - Continue with Flonase as you are doing (increase to twice daily).  - Repeat environmental testing was negative. - We could do repeat allergy testing if you are interested. - We can send you to see ENT.   2. Return in about 6 months (around 02/07/2024). You can have the follow up appointment with Dr. Dellis Anes or a Nurse Practicioner (our Nurse Practitioners are excellent and always have Physician oversight!).   Subjective:   Anne Mills is a 77 y.o. female presenting today for follow up of  Chief Complaint  Patient presents with   Allergic Rhinitis     Runny nose, constant drainage and cough - no change since August visit with Dr. Jacqualine Code has a history of the following: Patient Active Problem List   Diagnosis Date Noted   Dyslipidemia 01/13/2022   DDD (degenerative disc disease), cervical 06/01/2019   Degeneration of lumbar or lumbosacral intervertebral disc 06/01/2019   Asthma 12/13/2017   Gastroesophageal reflux disease 12/13/2017   Scoliosis (and kyphoscoliosis), idiopathic 09/15/2017   Cervical stenosis (uterine cervix) 03/03/2013    History obtained from: chart review and patient.  Anne Mills is a 77 y.o. female presenting for a follow up visit.  She was last seen in August 2020 for around 2 weeks ago.  At that time, Dr. Maurine Minister gave her 40 mg of Depo-Medrol.  She was started on oral prednisone 20 mg weaning over the course of 7 days.  She obtained repeat blood work for environmental allergens.  This was all negative.  She got her  steroid shot last time she was here. Then she had the pills of prednisone at the last visit.   Despite taking prednisone, she continued to worsen. She has not had a fever at all. She reports sinus pain and pressure bilaterally and copious drainage with a productive cough. She has felt very fatigued. PO intake has been normal. There are no other sick contacts in the family.   She is not interested in any additional allergy testing. She has never seen ENT to her knowledge.   Otherwise, there have been no changes to her past medical history, surgical history, family history, or social history.    Review of systems otherwise negative other than that mentioned in the HPI.    Objective:   Blood pressure 120/76, pulse 98, temperature 98.4 F (36.9 C), resp. rate 16, height 5' (1.524 m), weight 108 lb 14.4 oz (49.4 kg), SpO2 97%. Body mass index is 21.27 kg/m.    Physical Exam Vitals reviewed.  Constitutional:      Appearance: She is well-developed.  HENT:     Head: Normocephalic and atraumatic.     Right Ear: Tympanic membrane, ear canal and external ear normal. No drainage, swelling or tenderness. Tympanic membrane is not injected, scarred, erythematous, retracted or bulging.     Left Ear: Tympanic membrane, ear canal and external ear normal. No drainage, swelling or tenderness. Tympanic membrane is not injected, scarred, erythematous, retracted or bulging.  Nose: No nasal deformity, septal deviation, mucosal edema or rhinorrhea.     Right Turbinates: Enlarged, swollen and pale.     Left Turbinates: Enlarged, swollen and pale.     Right Sinus: No maxillary sinus tenderness or frontal sinus tenderness.     Left Sinus: No maxillary sinus tenderness or frontal sinus tenderness.     Mouth/Throat:     Mouth: Mucous membranes are not pale and not dry.     Pharynx: Uvula midline.  Eyes:     General:        Right eye: No discharge.        Left eye: No discharge.      Conjunctiva/sclera: Conjunctivae normal.     Right eye: Right conjunctiva is not injected. No chemosis.    Left eye: Left conjunctiva is not injected. No chemosis.    Pupils: Pupils are equal, round, and reactive to light.  Cardiovascular:     Rate and Rhythm: Normal rate and regular rhythm.     Heart sounds: Normal heart sounds.  Pulmonary:     Effort: Pulmonary effort is normal. No tachypnea, accessory muscle usage or respiratory distress.     Breath sounds: Normal breath sounds. No wheezing, rhonchi or rales.  Chest:     Chest wall: No tenderness.  Abdominal:     Tenderness: There is no abdominal tenderness. There is no guarding or rebound.  Lymphadenopathy:     Head:     Right side of head: No submandibular, tonsillar or occipital adenopathy.     Left side of head: No submandibular, tonsillar or occipital adenopathy.     Cervical: No cervical adenopathy.  Skin:    Coloration: Skin is not pale.     Findings: No abrasion, erythema, petechiae or rash. Rash is not papular, urticarial or vesicular.  Neurological:     Mental Status: She is alert.  Psychiatric:        Behavior: Behavior is cooperative.      Diagnostic studies: none       Anne Bonds, MD  [facility]

## 2023-08-10 NOTE — Patient Instructions (Signed)
Acute sinusitis - Add on azithromycin two tablets on the first day and then one tablet daily for four more days. - I am not convinced that this will work since this is not my first line treatment for sinus infections, but we will try it.  - Continue with Flonase as you are doing (increase to twice daily).  - Repeat environmental testing was negative. - We could do repeat allergy testing if you are interested. - We can send you to see ENT.   2. Return in about 6 months (around 02/07/2024). You can have the follow up appointment with Dr. Dellis Anes or a Nurse Practicioner (our Nurse Practitioners are excellent and always have Physician oversight!).    Please inform us of any Emergency Department visits, hospitalizations, or changes in symptoms. Call us before going to the ED for breathing or allergy symptoms since we might be able to fit you in for a sick visit. Feel free to contact us anytime with any questions, problems, or concerns.  It was a pleasure to meet you today!  Websites that have reliable patient information: 1. American Academy of Asthma, Allergy, and Immunology: www.aaaai.org 2. Food Allergy Research and Education (FARE): foodallergy.org 3. Mothers of Asthmatics: http://www.asthmacommunitynetwork.org 4. American College of Allergy, Asthma, and Immunology: www.acaai.org   COVID-19 Vaccine Information can be found at: PodExchange.nl For questions related to vaccine distribution or appointments, please email [email or web address] or call [phone].   We realize that you might be concerned about having an allergic reaction to the COVID19 vaccines. To help with that concern, WE ARE OFFERING THE COVID19 VACCINES IN OUR OFFICE! Ask the front desk for dates!     "Like" [place] on Facebook and Instagram for our latest updates!      A healthy democracy works best when [employer] participate! Make sure you are registered to  vote! If you have moved or changed any of your contact information, you will need to get this updated before voting! Scan the QR codes below to learn more!

## 2023-08-16 ENCOUNTER — Telehealth: Payer: Self-pay | Admitting: Allergy & Immunology

## 2023-08-16 ENCOUNTER — Other Ambulatory Visit: Payer: Self-pay | Admitting: *Deleted

## 2023-08-16 MED ORDER — AMOXICILLIN-POT CLAVULANATE 875-125 MG PO TABS: 1.0000 | ORAL_TABLET | Freq: Two times a day (BID) | ORAL | 0 refills | Status: AC

## 2023-08-16 NOTE — Telephone Encounter (Signed)
Will you please advise on behalf of Dr. Ernst Bowler?

## 2023-08-16 NOTE — Telephone Encounter (Signed)
Patient called stating the z pack did not work for her. Patient states she is still the same with cough and drainage that is more yellow than before. Patient would like to know what else can be done.   Best contact number: [phone]

## 2023-08-16 NOTE — Telephone Encounter (Signed)
Can you please call in Augmentin 875 mg twice a day for 10 days please. Please have her call the clinic if she develops a fever or her symptoms do not resolve. Thank you

## 2023-08-16 NOTE — Telephone Encounter (Signed)
Called and patient and advised, confirmed pharmacy and sent medication in. Patient verbalized understanding.

## 2023-08-16 NOTE — Telephone Encounter (Signed)
I called and spoke with the patient. She states that her grandson and daughter have taken a Z Pack in the past and felt better which is why she wanted to try it first. She states that she is allergic to an antibiotic but she can't remember the name. She did state that she is not allergic and tolerates Augmentin and Penicillin.   Patient states that she would like to be referred to [facility] for Chronic Sinusitis.

## 2023-08-16 NOTE — Telephone Encounter (Signed)
Can you please ask why we used azithromycin? That is not standard of care for acute sinusitis. Please ask if she can take augmentin- no allergy listed. Also can you please move forward with referral to ENT? Thank you

## 2023-08-17 NOTE — Telephone Encounter (Signed)
Thanks everyone for taking such good care of our patients!

## 2023-08-24 NOTE — Telephone Encounter (Signed)
Patient has been contacted by [facility]. She is scheduled to see Dr. Scot Jun, PA on 09-09-2023 @ 3:00 PM.

## 2023-09-07 ENCOUNTER — Other Ambulatory Visit (HOSPITAL_COMMUNITY): Payer: Self-pay

## 2023-09-09 DIAGNOSIS — R519 Headache, unspecified: Secondary | ICD-10-CM | POA: Insufficient documentation

## 2023-09-09 DIAGNOSIS — R0982 Postnasal drip: Secondary | ICD-10-CM | POA: Insufficient documentation

## 2023-09-09 DIAGNOSIS — R053 Chronic cough: Secondary | ICD-10-CM

## 2023-09-09 HISTORY — DX: Chronic cough: R05.3

## 2023-09-09 HISTORY — DX: Postnasal drip: R09.82

## 2023-09-09 HISTORY — DX: Headache, unspecified: R51.9

## 2023-10-13 DIAGNOSIS — R519 Headache, unspecified: Secondary | ICD-10-CM | POA: Diagnosis not present

## 2023-10-13 DIAGNOSIS — R0982 Postnasal drip: Secondary | ICD-10-CM | POA: Diagnosis not present

## 2023-10-13 DIAGNOSIS — J329 Chronic sinusitis, unspecified: Secondary | ICD-10-CM | POA: Diagnosis not present

## 2023-11-02 ENCOUNTER — Encounter: Payer: Self-pay | Admitting: Emergency Medicine

## 2023-12-15 ENCOUNTER — Other Ambulatory Visit (HOSPITAL_COMMUNITY): Payer: Self-pay

## 2023-12-23 ENCOUNTER — Encounter: Payer: Self-pay | Admitting: Family Medicine

## 2023-12-23 ENCOUNTER — Other Ambulatory Visit: Payer: Self-pay

## 2023-12-23 ENCOUNTER — Ambulatory Visit (INDEPENDENT_AMBULATORY_CARE_PROVIDER_SITE_OTHER): Payer: PPO | Admitting: Family Medicine

## 2023-12-23 VITALS — BP 100/62 | HR 76 | Temp 97.6°F | Ht 60.0 in | Wt 111.0 lb

## 2023-12-23 DIAGNOSIS — L250 Unspecified contact dermatitis due to cosmetics: Secondary | ICD-10-CM | POA: Diagnosis not present

## 2023-12-23 DIAGNOSIS — L2989 Other pruritus: Secondary | ICD-10-CM | POA: Diagnosis not present

## 2023-12-23 MED ORDER — PREDNISONE 20 MG PO TABS: 40.0000 mg | ORAL_TABLET | Freq: Every day | ORAL | 0 refills | Status: DC

## 2023-12-23 MED ORDER — PREDNISONE 20 MG PO TABS
40.0000 mg | ORAL_TABLET | Freq: Every day | ORAL | 0 refills | Status: DC
Start: 2023-12-23 — End: 2023-12-23
  Filled 2023-12-23: qty 10, 5d supply, fill #0

## 2023-12-23 NOTE — Patient Instructions (Signed)
Take the steroids as early in the day as possible with food and plenty of water.  You can continue using the anti-itch cream.  Avoid using the shampoo or conditioner that we think caused this rash.  Use cool compresses to help with itching.  You can also take over-the-counter Zyrtec if needed.

## 2023-12-23 NOTE — Progress Notes (Signed)
Subjective:     Patient ID: Anne Mills, female    DOB: 1946-07-13, 78 y.o.   MRN: [id number]  Chief Complaint  Patient presents with   Rash    Rash started around 2 weeks ago, located on neck and going down right shoulder. Itchy. Has been using psoriasis cream but only helped w itch    Rash Pertinent negatives include no congestion, cough, diarrhea, fever, joint pain, shortness of breath, sore throat or vomiting.     History of Present Illness         Complaining of a 2-week history of pruritic rash on both sides of her neck and upper shoulders after using a new shampoo and conditioner.  She has been using an anti-itch CeraVe a cream which has been helpful but the rash has not resolved.  Denies rash anywhere else.  No fever, chills, headache, sore throat, cough, wheezing or shortness of breath.     Health Maintenance Due  Topic Date Due   COVID-19 Vaccine (5 - 2024-25 season) 08/08/2023    Past Medical History:  Diagnosis Date   Allergy    Asthma    no inhaler   Back pain, chronic    Chest pain    under left breast- 2-3 x per week: h/o breast surgery   Chronic cough 09/09/2023   GERD (gastroesophageal reflux disease)    Headache around the eyes 09/09/2023   Headache(784.0)    Heart murmur    "not serious" per pt-no echo   Post-nasal drainage 09/09/2023   Scoliosis    Seasonal allergies    Shortness of breath    per pt due to allergies    Past Surgical History:  Procedure Laterality Date   ABDOMINOPLASTY     BREAST SURGERY     lt cyst   COLONOSCOPY     DILATION AND CURETTAGE OF UTERUS N/A 03/03/2013   Procedure: DILATATION AND CURETTAGE;  Surgeon: Antionette Char, MD;  Location: [facility];  Service: Gynecology;  Laterality: N/A;   EXAMINATION UNDER ANESTHESIA N/A 03/03/2013   Procedure: EXAM UNDER ANESTHESIA with ultrasound;  Surgeon: Antionette Char, MD;  Location: [facility];  Service: Gynecology;  Laterality: N/A;   EYE SURGERY  10/21 ---10/7 /2021    catarct both eyes   TRIGGER FINGER RELEASE Left 06/21/2014   Procedure: LEFT THUMB TRIGGER RELEASE;  Surgeon: Tami Ribas, MD;  Location: [facility];  Service: Orthopedics;  Laterality: Left;    Family History  Problem Relation Age of Onset   Dementia Mother    Hypertension Mother    Cancer Mother 56       Breast cancer   Depression Mother    Cancer Sister    Diabetes Sister    Heart disease Sister 9       AMI as cause of death   Hyperlipidemia Sister    Hypertension Sister    Mental retardation Sister    Cancer Father        prostate cancer   Heart disease Sister 94       AMI   Diabetes Sister     Social History   Socioeconomic History   Marital status: Married    Spouse name: Not on file   Number of children: Not on file   Years of education: Not on file   Highest education level: Not on file  Occupational History   Occupation: retired    Comment: house cleaning  Tobacco Use   Smoking  status: Never   Smokeless tobacco: Never  Vaping Use   Vaping status: Never Used  Substance and Sexual Activity   Alcohol use: No   Drug use: No   Sexual activity: Yes    Birth control/protection: Post-menopausal  Other Topics Concern   Not on file  Social History Narrative   Marital status: married x 41 years; second marriage; happily married.  From [place] Left [place] age 52; grew up [place].      Children:  2 daughters; 8 grandchildren; 10 gg.      Lives: with husband, daughter.      Employment: retired; clean homes      Tobacco; none      Alcohol: none      Drugs: none      Exercise: sporadic in 2019.  Joined [employer].      ADLs: independent with ADLs; no assistant devices.      Advanced Directives:  FULL CODE; no prolonged measures.         Social Drivers of Corporate investment banker Strain: Low Risk  (06/15/2023)   Overall Financial Resource Strain (CARDIA)    Difficulty of Paying Living Expenses: Not hard at all  Food Insecurity: No Food  Insecurity (06/15/2023)   Hunger Vital Sign    Worried About Running Out of Food in the Last Year: Never true    Ran Out of Food in the Last Year: Never true  Transportation Needs: No Transportation Needs (06/15/2023)   PRAPARE - Administrator, Civil Service (Medical): No    Lack of Transportation (Non-Medical): No  Physical Activity: Sufficiently Active (06/15/2023)   Exercise Vital Sign    Days of Exercise per Week: 2 days    Minutes of Exercise per Session: 120 min  Stress: No Stress Concern Present (06/15/2023)   [employer] of Occupational Health - Occupational Stress Questionnaire    Feeling of Stress : Not at all  Social Connections: Socially Integrated (06/15/2023)   Social Connection and Isolation Panel [NHANES]    Frequency of Communication with Friends and Family: More than three times a week    Frequency of Social Gatherings with Friends and Family: More than three times a week    Attends Religious Services: More than 4 times per year    Active Member of [employer] or Organizations: Yes    Attends Engineer, structural: More than 4 times per year    Marital Status: Married  Catering manager Violence: Not At Risk (06/15/2023)   Humiliation, Afraid, Rape, and Kick questionnaire    Fear of Current or Ex-Partner: No    Emotionally Abused: No    Physically Abused: No    Sexually Abused: No    Outpatient Medications Prior to Visit  Medication Sig Dispense Refill   B Complex Vitamins (VITAMIN B-COMPLEX PO) Take by mouth. Liquid     Calcium Carbonate-Vitamin D (CALCIUM 600 + D PO) Take 1 tablet by mouth daily.     Camphor-Menthol-Methyl Sal 1.2-5.7-6.3 % PTCH Apply topically.     Cholecalciferol 2000 UNITS TABS Take 1 tablet by mouth daily.     cyclobenzaprine (FLEXERIL) 5 MG tablet Take 1 tablet (5 mg total) by mouth at bedtime. 90 tablet 0   fluticasone (FLONASE) 50 MCG/ACT nasal spray Place 2 sprays into both nostrils daily. 48 g 3   gabapentin (NEURONTIN) 300  MG capsule Take 1 capsule (300 mg total) by mouth 4 (four) times daily. 360 capsule 3  Magnesium 500 MG CAPS Take by mouth.     Misc Natural Products (OSTEO BI-FLEX TRIPLE STRENGTH) TABS Take 2 tablets by mouth daily.     Multiple Vitamins-Minerals (STRESS TAB NF) TABS Take 1 tablet by mouth daily.     OVER THE COUNTER MEDICATION Transitions w/Hesperidin vegetarian taking     OVER THE COUNTER MEDICATION Wal-Finate Dw/Pseudoephedrine taking     OVER THE COUNTER MEDICATION Take 470 mg by mouth daily. Butcher's broom root     Probiotic Product (PROBIOTIC PO) Take by mouth. 10 Billion Active Cultures     rosuvastatin (CRESTOR) 10 MG tablet TAKE 1 TABLET BY MOUTH EVERY DAY 90 tablet 3   Trolamine Salicylate (ASPERCREME EX) Apply 1 application topically 2 (two) times daily as needed (for back pain).     azithromycin (ZITHROMAX) 250 MG tablet Take two tablets on the first day and then one tablet daily for four more days. (Patient not taking: Reported on 12/23/2023) 6 each 0   predniSONE (DELTASONE) 10 MG tablet Take 2 tablets by mouth daily for 3 days then 1 tablet daily by mouth on day 4. (Patient not taking: Reported on 12/23/2023) 7 tablet 0   No facility-administered medications prior to visit.    Allergies  Allergen Reactions   Gluten Meal Cough   Biaxin [Clarithromycin] Nausea And Vomiting    Review of Systems  Constitutional:  Negative for chills and fever.  HENT:  Negative for congestion and sore throat.   Respiratory:  Negative for cough and shortness of breath.   Cardiovascular:  Negative for chest pain and palpitations.  Gastrointestinal:  Negative for abdominal pain, diarrhea, nausea and vomiting.  Genitourinary:  Negative for frequency.  Musculoskeletal:  Negative for joint pain.  Skin:  Positive for itching and rash.  Neurological:  Negative for dizziness and focal weakness.       Objective:    Physical Exam Constitutional:      General: She is not in acute distress.     Appearance: She is not ill-appearing.  HENT:     Mouth/Throat:     Mouth: Mucous membranes are moist.     Pharynx: Oropharynx is clear.  Eyes:     Extraocular Movements: Extraocular movements intact.     Conjunctiva/sclera: Conjunctivae normal.  Cardiovascular:     Rate and Rhythm: Normal rate.  Pulmonary:     Effort: Pulmonary effort is normal.  Musculoskeletal:     Cervical back: Normal range of motion and neck supple.  Skin:    General: Skin is warm and dry.     Findings: Rash present.          Comments: Red, slightly raised, pruritic rash along anterior neck line and upper shoulders. No sign of infection.   Neurological:     General: No focal deficit present.     Mental Status: She is alert and oriented to person, place, and time.  Psychiatric:        Mood and Affect: Mood normal.        Behavior: Behavior normal.        Thought Content: Thought content normal.      BP 100/62 (BP Location: Left Arm, Patient Position: Sitting, Cuff Size: Normal)   Pulse 76   Temp 97.6 F (36.4 C) (Temporal)   Ht 5' (1.524 m)   Wt 111 lb (50.3 kg)   SpO2 99%   BMI 21.68 kg/m  Wt Readings from Last 3 Encounters:  12/23/23 111 lb (50.3 kg)  08/10/23 108 lb 14.4 oz (49.4 kg)  06/16/23 110 lb 8 oz (50.1 kg)       Assessment & Plan:   Problem List Items Addressed This Visit   None Visit Diagnoses       Contact dermatitis due to cosmetics, unspecified contact dermatitis type    -  Primary   Relevant Medications   predniSONE (DELTASONE) 20 MG tablet     Pruritic erythematous rash          Appears to have a contact dermatitis from a new shampoo and conditioner that she used 2 weeks ago.  Oral prednisone prescribed.  Counseling on potential side effects.  Continue with over-the-counter anti-itch cream.  Cool compresses and Zyrtec as needed.  Follow-up if worsening or not back to baseline after completing the steroids.  I have discontinued Anne Mills "Pat"'s azithromycin.  I am also having her maintain her Trolamine Salicylate (ASPERCREME EX), Calcium Carbonate-Vitamin D (CALCIUM 600 + D PO), Stress Tab NF, Cholecalciferol, Osteo Bi-Flex Triple Strength, OVER THE COUNTER MEDICATION, OVER THE COUNTER MEDICATION, B Complex Vitamins (VITAMIN B-COMPLEX PO), Magnesium, Probiotic Product (PROBIOTIC PO), Camphor-Menthol-Methyl Sal, cyclobenzaprine, fluticasone, OVER THE COUNTER MEDICATION, rosuvastatin, gabapentin, and predniSONE.  Meds ordered this encounter  Medications   DISCONTD: predniSONE (DELTASONE) 20 MG tablet    Sig: Take 2 tablets (40 mg total) by mouth daily with breakfast.    Dispense:  10 tablet    Refill:  0    Supervising Provider:   Hillard Danker A [[id number]]   predniSONE (DELTASONE) 20 MG tablet    Sig: Take 2 tablets (40 mg total) by mouth daily with breakfast.    Dispense:  10 tablet    Refill:  0    Supervising Provider:   Hillard Danker A [[id number]]

## 2024-01-17 ENCOUNTER — Ambulatory Visit: Payer: Self-pay | Admitting: Emergency Medicine

## 2024-01-17 NOTE — Telephone Encounter (Signed)
 Chief Complaint: bilateral leg swelling Symptoms: bilateral lower leg swelling Frequency: since January 29 Pertinent Negatives: Patient denies - see nurse notes Disposition: [] ED /[] Urgent Care (no appt availability in office) / [x] Appointment(In office/virtual)/ []  [facility]/ [] Home Care/ [] Refused Recommended Disposition /[] [facility]/ []  Follow-up with PCP Additional Notes: Patient called in reporting bilateral lower leg swelling that she noticed after she traveled to [place] Patient states she does have a history of Lumbar issues and is uncertain if it is related because her back has been hurting more than normal and she did a lot of sitting. Patient denies any hx of blood clots or heart failure. Patient states she did notice one instance in [place] where she had a shooting pain under her breast but it went away quickly. Patient states this is not a new occurrence for her. When assessing for cardiac history, patient states both of her sisters have enlarged hearts. Patient denies blue tint to legs, redness in legs, any new cramping in calves. Patient did state she had an inner thigh cramp that was very painful last night. Patient denies being more short of breath than usual. In-office appt made for tomorrow for further evaluation.    Copied from CRM [id number]. Topic: Clinical - Red Word Triage >> Jan 17, 2024  4:32 PM Jayson Michael wrote: [facility] that prompted transfer to Nurse Triage:  severe swelling in legs, no redness, but pain is present  intermittent, Lower lumbar pain, patient has a Hx of scoliosis,  symptoms presented 12/30/23 Reason for Disposition  [1] MODERATE leg swelling (e.g., swelling extends up to knees) AND [2] new-onset or worsening  Answer Assessment - Initial Assessment Questions 1. ONSET: "When did the swelling start?" (e.g., minutes, hours, days)     January 29th  2. LOCATION: "What part of the leg is swollen?"  "Are both legs swollen or just  one leg?"     Both legs swelling - ankle to calfs 3. SEVERITY: "How bad is the swelling?" (e.g., localized; mild, moderate, severe)   - Localized: Small area of swelling localized to one leg.   - MILD pedal edema: Swelling limited to foot and ankle, pitting edema < 1/4 inch (6 mm) deep, rest and elevation eliminate most or all swelling.   - MODERATE edema: Swelling of lower leg to knee, pitting edema > 1/4 inch (6 mm) deep, rest and elevation only partially reduce swelling.   - SEVERE edema: Swelling extends above knee, facial or hand swelling present.      Moderate  4. REDNESS: "Does the swelling look red or infected?"     No  5. PAIN: "Is the swelling painful to touch?" If Yes, ask: "How painful is it?"   (Scale 1-10; mild, moderate or severe)     No  6. FEVER: "Do you have a fever?" If Yes, ask: "What is it, how was it measured, and when did it start?"      No 7. CAUSE: "What do you think is causing the leg swelling?"     No 8. MEDICAL HISTORY: "Do you have a history of blood clots (e.g., DVT), cancer, heart failure, kidney disease, or liver failure?"     No 9. RECURRENT SYMPTOM: "Have you had leg swelling before?" If Yes, ask: "When was the last time?" "What happened that time?"     No  10. OTHER SYMPTOMS: "Do you have any other symptoms?" (e.g., chest pain, difficulty breathing)      A little  bit more shortness of breath  Protocols used: Leg Swelling and Edema-A-AH

## 2024-01-18 ENCOUNTER — Encounter: Payer: Self-pay | Admitting: Emergency Medicine

## 2024-01-18 ENCOUNTER — Ambulatory Visit (INDEPENDENT_AMBULATORY_CARE_PROVIDER_SITE_OTHER): Payer: PPO | Admitting: Emergency Medicine

## 2024-01-18 ENCOUNTER — Ambulatory Visit (INDEPENDENT_AMBULATORY_CARE_PROVIDER_SITE_OTHER): Payer: PPO

## 2024-01-18 VITALS — BP 108/64 | HR 81 | Temp 98.4°F | Ht 60.0 in | Wt 110.0 lb

## 2024-01-18 DIAGNOSIS — R600 Localized edema: Secondary | ICD-10-CM

## 2024-01-18 DIAGNOSIS — E785 Hyperlipidemia, unspecified: Secondary | ICD-10-CM

## 2024-01-18 DIAGNOSIS — I872 Venous insufficiency (chronic) (peripheral): Secondary | ICD-10-CM

## 2024-01-18 DIAGNOSIS — R079 Chest pain, unspecified: Secondary | ICD-10-CM | POA: Diagnosis not present

## 2024-01-18 DIAGNOSIS — J45909 Unspecified asthma, uncomplicated: Secondary | ICD-10-CM | POA: Diagnosis not present

## 2024-01-18 NOTE — Assessment & Plan Note (Signed)
Stable.  Diet and nutrition discussed.  Continue rosuvastatin 10 mg daily.

## 2024-01-18 NOTE — Assessment & Plan Note (Signed)
Presently stable.  No edema on today's examination Picture from several days ago does show significant bilateral leg edema. Most likely secondary to chronic venous insufficiency Chest x-ray done today

## 2024-01-18 NOTE — Progress Notes (Signed)
Anne Mills 78 y.o.   Chief Complaint  Patient presents with   Leg Swelling    Patient states she had leg swelling a couple of days ago. Today in office swelling has gone down. Did state when she did have the swelling it was uncomfortable when she was standing. It was from the knees down is where the swelling was     HISTORY OF PRESENT ILLNESS: This is a 78 y.o. female complaining of bilateral leg edema that happened last week Recently returned from [place] Has some chest pain on exertion. No other complaints or medical concerns today.  HPI   Prior to Admission medications   Medication Sig Start Date End Date Taking? Authorizing Provider  B Complex Vitamins (VITAMIN B-COMPLEX PO) Take by mouth. Liquid   Yes [provider]  Calcium Carbonate-Vitamin D (CALCIUM 600 + D PO) Take 1 tablet by mouth daily.   Yes [provider]  Camphor-Menthol-Methyl Sal 1.2-5.7-6.3 % PTCH Apply topically.   Yes [provider]  Cholecalciferol 2000 UNITS TABS Take 1 tablet by mouth daily.   Yes [provider]  cyclobenzaprine (FLEXERIL) 5 MG tablet Take 1 tablet (5 mg total) by mouth at bedtime. 10/10/21  Yes   fluticasone (FLONASE) 50 MCG/ACT nasal spray Place 2 sprays into both nostrils daily. 02/09/23  Yes Kozlow, Alvira Philips, MD  gabapentin (NEURONTIN) 300 MG capsule Take 1 capsule (300 mg total) by mouth 4 (four) times daily. 05/17/23 05/11/24 Yes Barbi Kumagai, Eilleen Kempf, MD  Magnesium 500 MG CAPS Take by mouth.   Yes [provider]  Misc Natural Products (OSTEO BI-FLEX TRIPLE STRENGTH) TABS Take 2 tablets by mouth daily.   Yes [provider]  Multiple Vitamins-Minerals (STRESS TAB NF) TABS Take 1 tablet by mouth daily.   Yes [provider]  OVER THE COUNTER MEDICATION Transitions w/Hesperidin vegetarian taking   Yes [provider]  OVER THE COUNTER MEDICATION Wal-Finate Dw/Pseudoephedrine taking   Yes [provider]   OVER THE COUNTER MEDICATION Take 470 mg by mouth daily. Butcher's broom root   Yes [provider]  predniSONE (DELTASONE) 20 MG tablet Take 2 tablets (40 mg total) by mouth daily with breakfast. 12/23/23  Yes Henson, Vickie L, NP-C  Probiotic Product (PROBIOTIC PO) Take by mouth. 10 Billion Active Cultures   Yes [provider]  rosuvastatin (CRESTOR) 10 MG tablet TAKE 1 TABLET BY MOUTH EVERY DAY 04/27/23  Yes Amahd Morino, Eilleen Kempf, MD  Trolamine Salicylate (ASPERCREME EX) Apply 1 application topically 2 (two) times daily as needed (for back pain).   Yes [provider]    Allergies  Allergen Reactions   Gluten Meal Cough   Biaxin [Clarithromycin] Nausea And Vomiting    Patient Active Problem List   Diagnosis Date Noted   Exertional chest pain 01/18/2024   Chronic venous insufficiency 01/18/2024   Bilateral leg edema 01/18/2024   Dyslipidemia 01/13/2022   DDD (degenerative disc disease), cervical 06/01/2019   Degeneration of lumbar or lumbosacral intervertebral disc 06/01/2019   Asthma 12/13/2017   Gastroesophageal reflux disease 12/13/2017   Idiopathic scoliosis and kyphoscoliosis 09/15/2017   Cervical stenosis (uterine cervix) 03/03/2013    Past Medical History:  Diagnosis Date   Allergy    Asthma    no inhaler   Back pain, chronic    Chest pain    under left breast- 2-3 x per week: h/o breast surgery   Chronic cough 09/09/2023   GERD (gastroesophageal reflux disease)  Headache around the eyes 09/09/2023   Headache(784.0)    Heart murmur    "not serious" per pt-no echo   Post-nasal drainage 09/09/2023   Scoliosis    Seasonal allergies    Shortness of breath    per pt due to allergies    Past Surgical History:  Procedure Laterality Date   ABDOMINOPLASTY     BREAST SURGERY     lt cyst   COLONOSCOPY     DILATION AND CURETTAGE OF UTERUS N/A 03/03/2013   Procedure: DILATATION AND CURETTAGE;  Surgeon: Antionette Char, MD;  Location:  [facility];  Service: Gynecology;  Laterality: N/A;   EXAMINATION UNDER ANESTHESIA N/A 03/03/2013   Procedure: EXAM UNDER ANESTHESIA with ultrasound;  Surgeon: Antionette Char, MD;  Location: [facility];  Service: Gynecology;  Laterality: N/A;   EYE SURGERY  10/21 ---10/7 /2021   catarct both eyes   TRIGGER FINGER RELEASE Left 06/21/2014   Procedure: LEFT THUMB TRIGGER RELEASE;  Surgeon: Tami Ribas, MD;  Location: [facility];  Service: Orthopedics;  Laterality: Left;    Social History   Socioeconomic History   Marital status: Married    Spouse name: Not on file   Number of children: Not on file   Years of education: Not on file   Highest education level: Not on file  Occupational History   Occupation: retired    Comment: house cleaning  Tobacco Use   Smoking status: Never   Smokeless tobacco: Never  Vaping Use   Vaping status: Never Used  Substance and Sexual Activity   Alcohol use: No   Drug use: No   Sexual activity: Yes    Birth control/protection: Post-menopausal  Other Topics Concern   Not on file  Social History Narrative   Marital status: married x 41 years; second marriage; happily married.  From [place] Left [place] age 37; grew up [place].      Children:  2 daughters; 8 grandchildren; 10 gg.      Lives: with husband, daughter.      Employment: retired; clean homes      Tobacco; none      Alcohol: none      Drugs: none      Exercise: sporadic in 2019.  Joined [employer].      ADLs: independent with ADLs; no assistant devices.      Advanced Directives:  FULL CODE; no prolonged measures.         Social Drivers of Corporate investment banker Strain: Low Risk  (06/15/2023)   Overall Financial Resource Strain (CARDIA)    Difficulty of Paying Living Expenses: Not hard at all  Food Insecurity: No Food Insecurity (06/15/2023)   Hunger Vital Sign    Worried About Running Out of Food in the Last Year: Never true    Ran Out of Food in the Last Year: Never true   Transportation Needs: No Transportation Needs (06/15/2023)   PRAPARE - Administrator, Civil Service (Medical): No    Lack of Transportation (Non-Medical): No  Physical Activity: Sufficiently Active (06/15/2023)   Exercise Vital Sign    Days of Exercise per Week: 2 days    Minutes of Exercise per Session: 120 min  Stress: No Stress Concern Present (06/15/2023)   [employer] of Occupational Health - Occupational Stress Questionnaire    Feeling of Stress : Not at all  Social Connections: Socially Integrated (06/15/2023)   Social Connection and Isolation Panel [NHANES]  Frequency of Communication with Friends and Family: More than three times a week    Frequency of Social Gatherings with Friends and Family: More than three times a week    Attends Religious Services: More than 4 times per year    Active Member of [employer] or Organizations: Yes    Attends Engineer, structural: More than 4 times per year    Marital Status: Married  Catering manager Violence: Not At Risk (06/15/2023)   Humiliation, Afraid, Rape, and Kick questionnaire    Fear of Current or Ex-Partner: No    Emotionally Abused: No    Physically Abused: No    Sexually Abused: No    Family History  Problem Relation Age of Onset   Dementia Mother    Hypertension Mother    Cancer Mother 45       Breast cancer   Depression Mother    Cancer Sister    Diabetes Sister    Heart disease Sister 54       AMI as cause of death   Hyperlipidemia Sister    Hypertension Sister    Mental retardation Sister    Cancer Father        prostate cancer   Heart disease Sister 34       AMI   Diabetes Sister      Review of Systems  Constitutional: Negative.  Negative for chills and fever.  HENT: Negative.  Negative for congestion and sore throat.   Respiratory: Negative.  Negative for cough and shortness of breath.   Cardiovascular:  Positive for leg swelling. Negative for chest pain and palpitations.   Gastrointestinal:  Negative for abdominal pain, diarrhea, nausea and vomiting.  Genitourinary: Negative.  Negative for dysuria and hematuria.  Skin: Negative.  Negative for rash.  Neurological: Negative.  Negative for dizziness and headaches.  All other systems reviewed and are negative.   Vitals:   01/18/24 1446  BP: 108/64  Pulse: 81  Temp: 98.4 F (36.9 C)  SpO2: 98%    Physical Exam Vitals reviewed.  Constitutional:      Appearance: Normal appearance.  HENT:     Head: Normocephalic.     Mouth/Throat:     Mouth: Mucous membranes are moist.     Pharynx: Oropharynx is clear.  Eyes:     Extraocular Movements: Extraocular movements intact.     Pupils: Pupils are equal, round, and reactive to light.  Cardiovascular:     Rate and Rhythm: Normal rate and regular rhythm.     Pulses: Normal pulses.     Heart sounds: Normal heart sounds.  Pulmonary:     Effort: Pulmonary effort is normal.     Breath sounds: Normal breath sounds.  Abdominal:     Palpations: Abdomen is soft.     Tenderness: There is no abdominal tenderness.  Musculoskeletal:     Cervical back: No tenderness.     Right lower leg: No edema.     Left lower leg: No edema.  Lymphadenopathy:     Cervical: No cervical adenopathy.  Skin:    General: Skin is warm and dry.     Capillary Refill: Capillary refill takes less than 2 seconds.  Neurological:     General: No focal deficit present.     Mental Status: She is alert and oriented to person, place, and time.  Psychiatric:        Mood and Affect: Mood normal.        Behavior: Behavior  normal.     DG Chest 2 View Result Date: 01/18/2024 CLINICAL DATA:  Exertional chest pain. Bilateral lower extremity swelling. History of asthma. EXAM: CHEST - 2 VIEW COMPARISON:  03/15/2012 FINDINGS: Normal sized heart. Clear lungs with normal vascularity. Mild thoracic spine degenerative changes and mild scoliosis. IMPRESSION: No acute abnormality. Electronically Signed    By: Beckie Salts M.D.   On: 01/18/2024 15:57   EKG: Normal sinus rhythm with ventricular rate of 70/min.  No acute ischemic changes.  Normal EKG. ASSESSMENT & PLAN: A total of 47 minutes was spent with the patient and counseling/coordination of care regarding preparing for this visit, review of most recent office visit notes, review of multiple chronic medical conditions and their management, differential diagnosis of exertional chest pain possibility of underlying CAD, need for cardiology evaluation, review of x-ray report done today, review of all medications, review of most recent bloodwork results, review of health maintenance items, education on nutrition, prognosis, documentation, and need for follow up.   Problem List Items Addressed This Visit       Cardiovascular and Mediastinum   Chronic venous insufficiency   Intermittent bilateral leg edema No swelling today.  No signs of cellulitis Good peripheral arterial distal circulation        Other   Dyslipidemia   Stable.  Diet and nutrition discussed. Continue rosuvastatin 10 mg daily.      Exertional chest pain - Primary   Chest pain on exertion, possibly anginal May have underlying coronary artery disease Normal EKG Chest x-ray done today.  No cardiomegaly seen. Recommend cardiology evaluation Referral placed today      Relevant Orders   DG Chest 2 View (Completed)   Ambulatory referral to Cardiology   Bilateral leg edema   Presently stable.  No edema on today's examination Picture from several days ago does show significant bilateral leg edema. Most likely secondary to chronic venous insufficiency Chest x-ray done today      Relevant Orders   DG Chest 2 View (Completed)     Patient Instructions  Edema  Edema is when you have too much fluid in your body or under your skin. Edema may make your legs, feet, and ankles swell. Swelling often happens in looser tissues, such as around your eyes. This is a common  condition. It gets more common as you get older. There are many possible causes of edema. These include: Eating too much salt (sodium). Being on your feet or sitting for a long time. Certain medical conditions, such as: Pregnancy. Heart failure. Liver disease. Kidney disease. Cancer. Hot weather may make edema worse. Edema is usually painless. Your skin may look swollen or shiny. Follow these instructions at home: Medicines Take over-the-counter and prescription medicines only as told by your doctor. Your doctor may prescribe a medicine to help your body get rid of extra water (diuretic). Take this medicine if you are told to take it. Eating and drinking Eat a low-salt (low-sodium) diet as told by your doctor. Sometimes, eating less salt may reduce swelling. Depending on the cause of your swelling, you may need to limit how much fluid you drink (fluid restriction). General instructions Raise the injured area above the level of your heart while you are sitting or lying down. Do not sit still or stand for a long time. Do not wear tight clothes. Do not wear garters on your upper legs. Exercise your legs. This can help the swelling go down. Wear compression stockings as told by your  doctor. It is important that these are the right size. These should be prescribed by your doctor to prevent possible injuries. If elastic bandages or wraps are recommended, use them as told by your doctor. Contact a doctor if: Treatment is not working. You have heart, liver, or kidney disease and have symptoms of edema. You have sudden and unexplained weight gain. Get help right away if: You have shortness of breath or chest pain. You cannot breathe when you lie down. You have pain, redness, or warmth in the swollen areas. You have heart, liver, or kidney disease and get edema all of a sudden. You have a fever and your symptoms get worse all of a sudden. These symptoms may be an emergency. Get help right  away. Call 911. Do not wait to see if the symptoms will go away. Do not drive yourself to the hospital. Summary Edema is when you have too much fluid in your body or under your skin. Edema may make your legs, feet, and ankles swell. Swelling often happens in looser tissues, such as around your eyes. Raise the injured area above the level of your heart while you are sitting or lying down. Follow your doctor's instructions about diet and how much fluid you can drink. This information is not intended to replace advice given to you by your health care provider. Make sure you discuss any questions you have with your health care provider. Document Revised: 07/28/2021 Document Reviewed: 07/28/2021 Elsevier Patient Education  2024 Elsevier Inc.      Edwina Barth, MD [facility]

## 2024-01-18 NOTE — Assessment & Plan Note (Signed)
Intermittent bilateral leg edema No swelling today.  No signs of cellulitis Good peripheral arterial distal circulation

## 2024-01-18 NOTE — Patient Instructions (Signed)
Edema  Edema is when you have too much fluid in your body or under your skin. Edema may make your legs, feet, and ankles swell. Swelling often happens in looser tissues, such as around your eyes. This is a common condition. It gets more common as you get older. There are many possible causes of edema. These include: Eating too much salt (sodium). Being on your feet or sitting for a long time. Certain medical conditions, such as: Pregnancy. Heart failure. Liver disease. Kidney disease. Cancer. Hot weather may make edema worse. Edema is usually painless. Your skin may look swollen or shiny. Follow these instructions at home: Medicines Take over-the-counter and prescription medicines only as told by your doctor. Your doctor may prescribe a medicine to help your body get rid of extra water (diuretic). Take this medicine if you are told to take it. Eating and drinking Eat a low-salt (low-sodium) diet as told by your doctor. Sometimes, eating less salt may reduce swelling. Depending on the cause of your swelling, you may need to limit how much fluid you drink (fluid restriction). General instructions Raise the injured area above the level of your heart while you are sitting or lying down. Do not sit still or stand for a long time. Do not wear tight clothes. Do not wear garters on your upper legs. Exercise your legs. This can help the swelling go down. Wear compression stockings as told by your doctor. It is important that these are the right size. These should be prescribed by your doctor to prevent possible injuries. If elastic bandages or wraps are recommended, use them as told by your doctor. Contact a doctor if: Treatment is not working. You have heart, liver, or kidney disease and have symptoms of edema. You have sudden and unexplained weight gain. Get help right away if: You have shortness of breath or chest pain. You cannot breathe when you lie down. You have pain, redness, or  warmth in the swollen areas. You have heart, liver, or kidney disease and get edema all of a sudden. You have a fever and your symptoms get worse all of a sudden. These symptoms may be an emergency. Get help right away. Call 911. Do not wait to see if the symptoms will go away. Do not drive yourself to the hospital. Summary Edema is when you have too much fluid in your body or under your skin. Edema may make your legs, feet, and ankles swell. Swelling often happens in looser tissues, such as around your eyes. Raise the injured area above the level of your heart while you are sitting or lying down. Follow your doctor's instructions about diet and how much fluid you can drink. This information is not intended to replace advice given to you by your health care provider. Make sure you discuss any questions you have with your health care provider. Document Revised: 07/28/2021 Document Reviewed: 07/28/2021 Elsevier Patient Education  2024 [employer].

## 2024-01-18 NOTE — Assessment & Plan Note (Addendum)
Chest pain on exertion, possibly anginal May have underlying coronary artery disease Normal EKG Chest x-ray done today.  No cardiomegaly seen. Recommend cardiology evaluation Referral placed today

## 2024-01-21 ENCOUNTER — Other Ambulatory Visit: Payer: Self-pay | Admitting: Radiology

## 2024-01-21 DIAGNOSIS — R079 Chest pain, unspecified: Secondary | ICD-10-CM

## 2024-01-27 ENCOUNTER — Telehealth: Payer: Self-pay

## 2024-01-27 NOTE — Telephone Encounter (Signed)
Copied from CRM [id number]. Topic: Referral - Request for Referral >> Jan 27, 2024  3:38 PM Rodman Pickle T wrote:  Did the patient discuss referral with their provider in the last year? No (If No - schedule appointment) (If Yes - send message)  Appointment offered? No  Type of order/referral and detailed reason for visit: vascular surgeon dr   Preference of office, provider, location: dr Raynelle Fanning [place] [phone]      [place]   If referral order, have you been seen by this specialty before? No (If Yes, this issue or another issue? When? Where?  Can we respond through MyChart? Yes

## 2024-01-28 ENCOUNTER — Encounter: Payer: Self-pay | Admitting: Radiology

## 2024-02-07 DIAGNOSIS — L570 Actinic keratosis: Secondary | ICD-10-CM | POA: Diagnosis not present

## 2024-02-07 DIAGNOSIS — H16223 Keratoconjunctivitis sicca, not specified as Sjogren's, bilateral: Secondary | ICD-10-CM | POA: Diagnosis not present

## 2024-02-07 DIAGNOSIS — D3131 Benign neoplasm of right choroid: Secondary | ICD-10-CM | POA: Diagnosis not present

## 2024-02-07 DIAGNOSIS — H43393 Other vitreous opacities, bilateral: Secondary | ICD-10-CM | POA: Diagnosis not present

## 2024-02-07 DIAGNOSIS — L209 Atopic dermatitis, unspecified: Secondary | ICD-10-CM | POA: Diagnosis not present

## 2024-02-07 DIAGNOSIS — D3132 Benign neoplasm of left choroid: Secondary | ICD-10-CM | POA: Diagnosis not present

## 2024-02-18 DIAGNOSIS — R0982 Postnasal drip: Secondary | ICD-10-CM | POA: Diagnosis not present

## 2024-02-18 DIAGNOSIS — J029 Acute pharyngitis, unspecified: Secondary | ICD-10-CM | POA: Diagnosis not present

## 2024-02-18 DIAGNOSIS — J309 Allergic rhinitis, unspecified: Secondary | ICD-10-CM | POA: Diagnosis not present

## 2024-03-21 ENCOUNTER — Ambulatory Visit: Admitting: Allergy and Immunology

## 2024-03-21 ENCOUNTER — Encounter: Payer: Self-pay | Admitting: Allergy and Immunology

## 2024-03-21 ENCOUNTER — Other Ambulatory Visit: Payer: Self-pay

## 2024-03-21 VITALS — BP 92/60 | HR 76 | Temp 98.5°F | Resp 14

## 2024-03-21 DIAGNOSIS — L814 Other melanin hyperpigmentation: Secondary | ICD-10-CM | POA: Diagnosis not present

## 2024-03-21 DIAGNOSIS — H04123 Dry eye syndrome of bilateral lacrimal glands: Secondary | ICD-10-CM | POA: Diagnosis not present

## 2024-03-21 DIAGNOSIS — J3089 Other allergic rhinitis: Secondary | ICD-10-CM

## 2024-03-21 DIAGNOSIS — L821 Other seborrheic keratosis: Secondary | ICD-10-CM | POA: Diagnosis not present

## 2024-03-21 DIAGNOSIS — D225 Melanocytic nevi of trunk: Secondary | ICD-10-CM | POA: Diagnosis not present

## 2024-03-21 NOTE — Patient Instructions (Addendum)
  1.  Use OTC Flonase -1 spray each nostril 3 times per week   2. Use the following if needed:   A. Nasal saline a few times per day  B. Nasal ipratropium 0.06% - 2 sprays each nostril every 6 hours  C. Loratadine 10 mg - 1 tablet 1 time per day  D. Alaway - 1 drop each eye 1-2 times per day  E. Systane drops - multiple times per day  3.  Return to clinic in 12 months or earlier if problem  4. Influenza = Tamiflu. Covid = paxlovid

## 2024-03-21 NOTE — Progress Notes (Unsigned)
 [place] [facility] [place] [facility] [place] - [place]   Follow-up Note  Referring Provider: Georgina Mills, [place] Primary Provider: Georgina Quint, MD Date of Office Visit: 03/21/2024  Subjective:   Anne Mills (DOB: 18-Aug-1946) is a 78 y.o. female who [facility] in re-evaluation of the following:  HPI: [name] returns to this clinic in evaluation of rhinitis.  I last saw her in this clinic 09 February 2023.  She did visit with Dr. Dellis Mills on 13 February 2023 for what appeared to be a viral induced respiratory tract infection.  She has done very well and does not really have much problems with her nose other than some occasional nasal congestion and some intermittent nasal discharge and some intermittent sore throat and some intermittent headache.  Feck, she has a headache on a pretty consistent basis averaging out to a few times per week.  She does not really use any Flonase on a regular basis.  She does use loratadine and she has been given nasal ipratropium for her rhinorrhea which works pretty well.  Her eyes are sometimes itchy and she sometimes has to use some Alaway eyedrops.  She has dry eye and she is using some wetting solutions.  Allergies as of 03/21/2024       Reactions   Gluten Meal Cough   Biaxin [clarithromycin] Nausea And Vomiting        Medication List    ASPERCREME EX Apply 1 application topically 2 (two) times daily as needed (for back pain).   CALCIUM 600 + D PO Take 1 tablet by mouth daily.   Camphor-Menthol-Methyl Sal 1.2-5.7-6.3 % Ptch Apply topically.   Cholecalciferol 50 MCG (2000 UT) Tabs Take 1 tablet by mouth daily.   cyclobenzaprine 5 MG tablet Commonly known as: FLEXERIL Take 1 tablet (5 mg total) by mouth at bedtime.   fluticasone 50 MCG/ACT nasal spray Commonly known as: FLONASE Place 2 sprays into both nostrils daily.   gabapentin 300 MG capsule Commonly known as:  NEURONTIN Take 1 capsule (300 mg total) by mouth 4 (four) times daily.   Magnesium 500 MG Caps Take by mouth.   Osteo Bi-Flex Triple Strength Tabs Take 2 tablets by mouth daily.   OVER THE COUNTER MEDICATION Transitions w/Hesperidin vegetarian taking   OVER THE COUNTER MEDICATION Wal-Finate Dw/Pseudoephedrine taking   OVER THE COUNTER MEDICATION Take 470 mg by mouth daily. Butcher's broom root   predniSONE 20 MG tablet Commonly known as: DELTASONE Take 2 tablets (40 mg total) by mouth daily with breakfast.   PROBIOTIC PO Take by mouth. 10 Billion Active Cultures   rosuvastatin 10 MG tablet Commonly known as: CRESTOR TAKE 1 TABLET BY MOUTH EVERY DAY   Stress Tab NF Tabs Take 1 tablet by mouth daily.   VITAMIN B-COMPLEX PO Take by mouth. Liquid    Past Medical History:  Diagnosis Date   Allergy    Asthma    no inhaler   Back pain, chronic    Chest pain    under left breast- 2-3 x per week: h/o breast surgery   Chronic cough 09/09/2023   GERD (gastroesophageal reflux disease)    Headache around the eyes 09/09/2023   Headache(784.0)    Heart murmur    "not serious" per pt-no echo   Post-nasal drainage 09/09/2023   Scoliosis    Seasonal allergies    Shortness of breath    per pt due to allergies  Past Surgical History:  Procedure Laterality Date   ABDOMINOPLASTY     BREAST SURGERY     lt cyst   COLONOSCOPY     DILATION AND CURETTAGE OF UTERUS N/A 03/03/2013   Procedure: DILATATION AND CURETTAGE;  Surgeon: Antionette Char, MD;  Location: [facility];  Service: Gynecology;  Laterality: N/A;   EXAMINATION UNDER ANESTHESIA N/A 03/03/2013   Procedure: EXAM UNDER ANESTHESIA with ultrasound;  Surgeon: Antionette Char, MD;  Location: [facility];  Service: Gynecology;  Laterality: N/A;   EYE SURGERY  10/21 ---10/7 /2021   catarct both eyes   TRIGGER FINGER RELEASE Left 06/21/2014   Procedure: LEFT THUMB TRIGGER RELEASE;  Surgeon: Tami Ribas, MD;  Location: [facility];  Service: Orthopedics;  Laterality: Left;    Review of systems negative except as noted in HPI / PMHx or noted below:  Review of Systems  Constitutional: Negative.   HENT: Negative.    Eyes: Negative.   Respiratory: Negative.    Cardiovascular: Negative.   Gastrointestinal: Negative.   Genitourinary: Negative.   Musculoskeletal: Negative.   Skin: Negative.   Neurological: Negative.   Endo/Heme/Allergies: Negative.   Psychiatric/Behavioral: Negative.       Objective:   Vitals:   03/21/24 1642  BP: 92/60  Pulse: 76  Resp: 14  Temp: 98.5 F (36.9 C)  SpO2: 98%          Physical Exam Constitutional:      Appearance: She is not diaphoretic.  HENT:     Head: Normocephalic.     Right Ear: Tympanic membrane, ear canal and external ear normal.     Left Ear: Tympanic membrane, ear canal and external ear normal.     Nose: Nose normal. No mucosal edema or rhinorrhea.     Mouth/Throat:     Pharynx: Uvula midline. No oropharyngeal exudate.  Eyes:     Conjunctiva/sclera: Conjunctivae normal.  Neck:     Thyroid: No thyromegaly.     Trachea: Trachea normal. No tracheal tenderness or tracheal deviation.  Cardiovascular:     Rate and Rhythm: Normal rate and regular rhythm.     Heart sounds: Normal heart sounds, S1 normal and S2 normal. No murmur heard. Pulmonary:     Effort: No respiratory distress.     Breath sounds: Normal breath sounds. No stridor. No wheezing or rales.  Lymphadenopathy:     Head:     Right side of head: No tonsillar adenopathy.     Left side of head: No tonsillar adenopathy.     Cervical: No cervical adenopathy.  Skin:    Findings: No erythema or rash.     Nails: There is no clubbing.  Neurological:     Mental Status: She is alert.     Diagnostics: none  Assessment and Plan:   1. Perennial allergic rhinitis   2. Dry eye syndrome of both eyes    1.  Use OTC Flonase -1 spray each nostril 3 times per week   2. Use the  following if needed:   A. Nasal saline a few times per day  B. Nasal ipratropium 0.06% - 2 sprays each nostril every 6 hours  C. Loratadine 10 mg - 1 tablet 1 time per day  D. Alaway - 1 drop each eye 1-2 times per day  E. Systane drops - multiple times per day  3.  Return to clinic in 12 months or earlier if problem  4. Influenza = Tamiflu. Covid = paxlovid  I think that Deatra Face would benefit from using some dose of Flonase on a pretty consistent basis and I have asked her to use her fluticasone nasal spray at least 3 times per week.  There is a selection of other agents that she can utilize should they be required as noted above.  Assuming she does well with this plan I will see her back in this clinic in 1 year or earlier if there is a problem.  Schuyler Custard, MD Allergy / Immunology [facility]

## 2024-03-22 ENCOUNTER — Encounter: Payer: Self-pay | Admitting: Allergy and Immunology

## 2024-03-30 DIAGNOSIS — Z1231 Encounter for screening mammogram for malignant neoplasm of breast: Secondary | ICD-10-CM | POA: Diagnosis not present

## 2024-03-30 LAB — HM MAMMOGRAPHY

## 2024-03-31 ENCOUNTER — Encounter: Payer: Self-pay | Admitting: Emergency Medicine

## 2024-04-10 ENCOUNTER — Other Ambulatory Visit (HOSPITAL_COMMUNITY): Payer: Self-pay

## 2024-04-13 ENCOUNTER — Encounter: Payer: Self-pay | Admitting: Internal Medicine

## 2024-04-13 ENCOUNTER — Ambulatory Visit: Payer: PPO | Attending: Internal Medicine | Admitting: Internal Medicine

## 2024-04-13 VITALS — BP 99/62 | HR 77 | Ht 61.0 in | Wt 112.0 lb

## 2024-04-13 DIAGNOSIS — Z8241 Family history of sudden cardiac death: Secondary | ICD-10-CM

## 2024-04-13 DIAGNOSIS — R072 Precordial pain: Secondary | ICD-10-CM

## 2024-04-13 DIAGNOSIS — Z8249 Family history of ischemic heart disease and other diseases of the circulatory system: Secondary | ICD-10-CM | POA: Diagnosis not present

## 2024-04-13 DIAGNOSIS — R600 Localized edema: Secondary | ICD-10-CM | POA: Diagnosis not present

## 2024-04-13 NOTE — Progress Notes (Signed)
 Cardiology Office Note:  .    Date:  04/13/2024  ID:  MAURYA CHARLSON, DOB [birth year], MRN [id number] PCP: Elvira Hammersmith, MD  [facility] Providers Cardiologist:  None     CC: FHX of cardiomegaly Consulted for the evaluation of leg swelling at the behest of Dr. Vedia Geralds   History of Present Illness: .    Anne Mills is a 78 y.o. female  with leg swelling. She was referred by Dr. Segardia for evaluation of her lower extremity edema.  She has been experiencing swelling in her legs, which she has had before, but notes it was not as severe previously. The swelling was accompanied by sharp pain a few times, particularly after returning from [place] She took pictures of her legs on February 4th and 6th, which show significant swelling. The swelling has since improved, and she currently describes her legs as 'doing good', although she is concerned about spider veins and past leg injury.  She has a history of breaking her leg years ago, and the area where she previously broke it shows some additional swelling. She also experiences occasional pressure, which she associates with acid reflux.  Her family history is significant for heart problems. Her sister had surgery for a large heart, and her younger sister was recently told she has a slightly enlarged heart. Her older sister, who was diabetic, died of a heart attack. Additionally, her grandfather reportedly died suddenly after exertion.  No current cardiac symptoms but reports past episodes of exertional chest discomfort. Concerned about spider veins and past leg injury.  Discussed the use of AI scribe software for clinical note transcription with the patient, who gave verbal consent to proceed.   Relevant histories: .  Social  - Her family history is significant for heart problems. Her sister had surgery for a large heart, and her younger sister was recently told she has a slightly enlarged heart. Her older sister,  who was diabetic, died of a heart attack. Additionally, her grandfather reportedly died suddenly after exertion. ROS: As per HPI.   Physical Exam:    VS:  BP 99/62 (BP Location: Right Arm)   Pulse 77   Ht 5\' 1"  (1.549 m)   Wt 112 lb (50.8 kg)   SpO2 98%   BMI 21.16 kg/m    Wt Readings from Last 3 Encounters:  04/13/24 112 lb (50.8 kg)  01/18/24 110 lb (49.9 kg)  12/23/23 111 lb (50.3 kg)    Gen: no distress   Neck: No JVD Cardiac: No Rubs or Gallops, no resting murmur, systolic standing murmur with handgrip, RRR +2 radial pulses Respiratory: Clear to auscultation bilaterally, normal effort, normal  respiratory rate GI: Soft, nontender, non-distended  MS: No  edema;  moves all extremities Integument: Skin feels warm Neuro:  At time of evaluation, alert and oriented to person/place/time/situation  Psych: Normal affect, patient feels ok   ASSESSMENT AND PLAN: .    Lower extremity edema - Intermittent swelling in the lower extremities, particularly in the right leg, with improvement after a visit to [place] - now resolved - will get echo   Chest pain Fhx of CAD (sister died from MI) - Order stress test to evaluate for exertional chest symptoms and potential cardiac issues. Discussed risks, including potential for myocardial infarction, and emphasized the benefit of conducting the test in a controlled environment. (NM Exercise)  Family history of cardiomyopathy & SCD - Order echocardiogram to assess cardiac structure and rule out  cardiomyopathy. Explained that a normal echocardiogram would provide reassurance about cardiac structure. - Request she obtain and send documentation of her sister's cardiac diagnosis for further evaluation. Emphasized the importance of knowing the specific diagnosis to tailor her care.  PRN if cardiac testing is negative (LE swelling has resolved)   Gloriann Larger, MD FASE [facility] Cardiologist [facility]  [address]  [place], [address] [place], [place] [address] [phone]  2:25 PM

## 2024-04-13 NOTE — Patient Instructions (Signed)
 Medication Instructions:  Your physician recommends that you continue on your current medications as directed. Please refer to the Current Medication list given to you today.  *If you need a refill on your cardiac medications before your next appointment, please call your pharmacy*  Lab Work: NONE  If you have labs (blood work) drawn today and your tests are completely normal, you will receive your results only by: MyChart Message (if you have MyChart) OR A paper copy in the mail If you have any lab test that is abnormal or we need to change your treatment, we will call you to review the results.  Testing/Procedures: Your physician has requested that you have an echocardiogram. Echocardiography is a painless test that uses sound waves to create images of your heart. It provides your doctor with information about the size and shape of your heart and how well your heart's chambers and valves are working. This procedure takes approximately one hour. There are no restrictions for this procedure. Please do NOT wear cologne, perfume, aftershave, or lotions (deodorant is allowed). Please arrive 15 minutes prior to your appointment time.  Please note: We ask at that you not bring children with you during ultrasound (echo/ vascular) testing. Due to room size and safety concerns, children are not allowed in the ultrasound rooms during exams. Our front office staff cannot provide observation of children in our lobby area while testing is being conducted. An adult accompanying a patient to their appointment will only be allowed in the ultrasound room at the discretion of the ultrasound technician under special circumstances. We apologize for any inconvenience.  Your physician has requested that you have en exercise stress myoview. For further information please visit [email or web address] Please follow instruction sheet, as given.    You are scheduled for a Myocardial Perfusion Imaging Study. Please  arrive 15 minutes prior to your appointment time for registration and insurance purposes.   The test will take approximately 3 to 4 hours to complete; you may bring reading material.  If someone comes with you to your appointment, they will need to remain in the main lobby due to limited space in the testing area.    How to prepare for your Myocardial Perfusion Test: Do not eat or drink 3 hours prior to your test, except you may have water. Do not consume products containing caffeine (regular or decaffeinated) 12 hours prior to your test. (ex: coffee, chocolate, sodas, tea). Do bring a list of your current medications with you.  If not listed below, you may take your medications as normal. Do wear comfortable clothes (no dresses or overalls) and walking shoes, tennis shoes preferred (No heels or open toe shoes are allowed). Do NOT wear cologne, perfume, aftershave, or lotions (deodorant is allowed). If these instructions are not followed, your test will have to be rescheduled.  If you cannot keep your appointment, please provide 24 hours notification to the Nuclear Lab, to avoid a possible $50 charge to your account.      Follow-Up:As needed At [facility], you and your health needs are our priority.  As part of our continuing mission to provide you with exceptional heart care, our providers are all part of one team.  This team includes your primary Cardiologist (physician) and Advanced Practice Providers or APPs (Physician Assistants and Nurse Practitioners) who all work together to provide you with the care you need, when you need it.  Provider:  Gloriann Larger, MD

## 2024-04-18 ENCOUNTER — Encounter (HOSPITAL_COMMUNITY): Payer: Self-pay

## 2024-04-21 ENCOUNTER — Other Ambulatory Visit: Payer: Self-pay | Admitting: Internal Medicine

## 2024-04-21 ENCOUNTER — Telehealth (HOSPITAL_COMMUNITY): Payer: Self-pay | Admitting: *Deleted

## 2024-04-21 DIAGNOSIS — R072 Precordial pain: Secondary | ICD-10-CM

## 2024-04-21 NOTE — Telephone Encounter (Signed)
 Left detailed instructions for mpi study.

## 2024-04-24 ENCOUNTER — Telehealth (HOSPITAL_COMMUNITY): Payer: Self-pay

## 2024-04-24 ENCOUNTER — Other Ambulatory Visit: Payer: Self-pay | Admitting: Internal Medicine

## 2024-04-24 DIAGNOSIS — R072 Precordial pain: Secondary | ICD-10-CM

## 2024-04-24 NOTE — Telephone Encounter (Signed)
 Spoke with the patient, instructions given. S.Draylon Mercadel CCT

## 2024-04-26 ENCOUNTER — Ambulatory Visit (HOSPITAL_COMMUNITY)
Admission: RE | Admit: 2024-04-26 | Discharge: 2024-04-26 | Disposition: A | Discharge status: Home / Self Care | Source: Ambulatory Visit | Attending: Cardiovascular Disease | Admitting: Cardiovascular Disease

## 2024-04-26 DIAGNOSIS — I700 Atherosclerosis of aorta: Secondary | ICD-10-CM | POA: Diagnosis not present

## 2024-04-26 DIAGNOSIS — R072 Precordial pain: Secondary | ICD-10-CM | POA: Insufficient documentation

## 2024-04-26 LAB — MYOCARDIAL PERFUSION IMAGING
Angina Index: 0
Duke Treadmill Score: 8
Estimated workload: 9.7
Exercise duration (min): 7 min
Exercise duration (sec): 47 s
LV dias vol: 39 mL (ref 46–106)
LV sys vol: 3 mL
MPHR: 143 {beats}/min
Nuc Stress EF: 92 %
Peak HR: 137 {beats}/min
Percent HR: 95 %
Rest HR: 71 {beats}/min
Rest Nuclear Isotope Dose: 10.5 mCi
SDS: 5
SRS: 2
SSS: 7
ST Depression (mm): 0 mm
Stress Nuclear Isotope Dose: 32.3 mCi
TID: 0.79

## 2024-04-26 MED ORDER — TECHNETIUM TC 99M TETROFOSMIN IV KIT
32.3000 | PACK | Freq: Once | INTRAVENOUS | Status: AC | PRN
  Administered 2024-04-26: 32.3 via INTRAVENOUS

## 2024-04-26 MED ORDER — TECHNETIUM TC 99M TETROFOSMIN IV KIT
10.5000 | PACK | Freq: Once | INTRAVENOUS | Status: AC | PRN
  Administered 2024-04-26: 10.5 via INTRAVENOUS

## 2024-04-27 ENCOUNTER — Ambulatory Visit: Payer: Self-pay | Admitting: Internal Medicine

## 2024-05-01 ENCOUNTER — Other Ambulatory Visit: Payer: Self-pay | Admitting: Emergency Medicine

## 2024-05-01 DIAGNOSIS — E785 Hyperlipidemia, unspecified: Secondary | ICD-10-CM

## 2024-05-02 ENCOUNTER — Other Ambulatory Visit: Payer: Self-pay | Admitting: Emergency Medicine

## 2024-05-02 DIAGNOSIS — E785 Hyperlipidemia, unspecified: Secondary | ICD-10-CM

## 2024-05-02 NOTE — Telephone Encounter (Unsigned)
 Copied from CRM [id number]. Topic: Clinical - Medication Refill >> May 02, 2024  3:15 PM Tiffany S wrote: Medication: rosuvastatin  (CRESTOR ) 10 MG tablet [[id number]]  Has the patient contacted their pharmacy? Yes (Agent: If no, request that the patient contact the pharmacy for the refill. If patient does not wish to contact the pharmacy document the reason why and proceed with request.) (Agent: If yes, when and what did the pharmacy advise?)  This is the patient's preferred pharmacy:   [id number] - [place], [place] - [address] AT [address] OF [address] [place]  [address] Phone: [phone] Fax: [phone]  Is this the correct pharmacy for this prescription? Yes If no, delete pharmacy and type the correct one.   Has the prescription been filled recently? Yes  Is the patient out of the medication? Yes  Has the patient been seen for an appointment in the last year OR does the patient have an upcoming appointment? Yes  Can we respond through MyChart? Yes  Agent: Please be advised that Rx refills may take up to 3 business days. We ask that you follow-up with your pharmacy.

## 2024-05-24 ENCOUNTER — Ambulatory Visit (HOSPITAL_COMMUNITY)
Admission: RE | Admit: 2024-05-24 | Discharge: 2024-05-24 | Disposition: A | Discharge status: Home / Self Care | Source: Ambulatory Visit | Attending: Internal Medicine | Admitting: Internal Medicine

## 2024-05-24 DIAGNOSIS — R072 Precordial pain: Secondary | ICD-10-CM | POA: Diagnosis not present

## 2024-05-24 DIAGNOSIS — Z8249 Family history of ischemic heart disease and other diseases of the circulatory system: Secondary | ICD-10-CM

## 2024-05-24 DIAGNOSIS — Z8241 Family history of sudden cardiac death: Secondary | ICD-10-CM | POA: Diagnosis not present

## 2024-05-25 LAB — ECHOCARDIOGRAM COMPLETE
Area-P 1/2: 3.83 cm2
P 1/2 time: 354 ms
S' Lateral: 2.1 cm

## 2024-05-30 ENCOUNTER — Other Ambulatory Visit: Payer: Self-pay | Admitting: Emergency Medicine

## 2024-05-30 ENCOUNTER — Ambulatory Visit: Payer: Self-pay

## 2024-05-30 DIAGNOSIS — M51379 Other intervertebral disc degeneration, lumbosacral region without mention of lumbar back pain or lower extremity pain: Secondary | ICD-10-CM

## 2024-05-30 DIAGNOSIS — E785 Hyperlipidemia, unspecified: Secondary | ICD-10-CM

## 2024-05-30 NOTE — Telephone Encounter (Unsigned)
 Copied from CRM [id number]. Topic: Clinical - Medication Refill >> May 30, 2024  4:25 PM Antwanette L wrote: Medication: gabapentin  (NEURONTIN ) 300 MG capsule and rosuvastatin  (CRESTOR ) 10 MG tablet  Has the patient contacted their pharmacy? Yes   This is the patient's preferred pharmacy:  [facility] [address] [place] [address] Phone: [phone] Fax: [phone]  [facility] [address] [place] [address] Phone: [phone] Fax: [phone]      Is this the correct pharmacy for this prescription? Yes   Has the prescription been filled recently? No Gabapentin  last refilled on 05/17/23 and rosuvastatin  refilled on 05/01/24  Is the patient out of the medication? No. Patient has 5 days left of medicine  Has the patient been seen for an appointment in the last year OR does the patient have an upcoming appointment? Yes. Last ov with Dr. Sargrdia on 01/18/24  Can we respond through MyChart? No. Contact the patient by phone at [phone]  Agent: Please be advised that Rx refills may take up to 3 business days. We ask that you follow-up with your pharmacy.

## 2024-05-30 NOTE — Telephone Encounter (Signed)
 FYI Only or Action Required?: FYI only for provider.  Patient was last seen in primary care on 01/18/2024 by Purcell Emil Schanz, MD. Called Nurse Triage reporting Otalgia. Symptoms began yesterday. Interventions attempted: Nothing. Symptoms are: unchanged.  Triage Disposition: See Physician Within 24 Hours  Patient/caregiver understands and will follow disposition?: Yes  **Pt. Scheduled for 6/25 with PCP**                  Copied from CRM #[id number]. Topic: Clinical - Red Word Triage >> May 30, 2024  4:34 PM Antwanette L wrote: Red Word that prompted transfer to Nurse Triage: Patient is experiencing severe pain on her left ear and itchiness on the right ear Reason for Disposition  Earache  (Exceptions: brief ear pain of < 60 minutes duration, earache occurring during air travel  Answer Assessment - Initial Assessment Questions 1. LOCATION: Which ear is involved?   Right ear itching in canal, left ear pain   2. ONSET: When did the ear start hurting     X 1 day    3. SEVERITY: How bad is the pain?  (Scale 1-10; mild, moderate or severe)   - MILD (1-3): doesn't interfere with normal activities    - MODERATE (4-7): interferes with normal activities or awakens from sleep    - SEVERE (8-10): excruciating pain, unable to do any normal activities      6/10   4. URI SYMPTOMS: Do you have a runny nose or cough?          No, she does have allergies   5. FEVER: Do you have a fever? If Yes, ask: What is your temperature, how was it measured, and when did it start?     No   6. CAUSE: Have you been swimming recently?, How often do you use Q-TIPS?, Have you had any recent air travel or scuba diving?    No    7. OTHER SYMPTOMS: Do you have any other symptoms? (e.g., headache, stiff neck, dizziness, vomiting, runny nose, decreased hearing)   She reports neck pain yesterday, now resolved.  Protocols used: Rilla

## 2024-05-31 ENCOUNTER — Ambulatory Visit (INDEPENDENT_AMBULATORY_CARE_PROVIDER_SITE_OTHER): Admitting: Emergency Medicine

## 2024-05-31 ENCOUNTER — Encounter: Payer: Self-pay | Admitting: Emergency Medicine

## 2024-05-31 VITALS — BP 112/62 | HR 79 | Temp 98.6°F | Ht 61.0 in | Wt 113.0 lb

## 2024-05-31 DIAGNOSIS — H60502 Unspecified acute noninfective otitis externa, left ear: Secondary | ICD-10-CM | POA: Diagnosis not present

## 2024-05-31 DIAGNOSIS — H9202 Otalgia, left ear: Secondary | ICD-10-CM | POA: Diagnosis not present

## 2024-05-31 MED ORDER — HYDROCORTISONE-ACETIC ACID 1-2 % OT SOLN: 3.0000 [drp] | Freq: Three times a day (TID) | OTIC | 1 refills | Status: AC

## 2024-05-31 MED ORDER — CEFUROXIME AXETIL 250 MG PO TABS: 250.0000 mg | ORAL_TABLET | Freq: Two times a day (BID) | ORAL | 0 refills | Status: AC

## 2024-05-31 NOTE — Patient Instructions (Signed)
Earache, Adult An earache, or ear pain, can be caused by many things, including: An infection. Ear wax buildup. Ear pressure. Something in the ear that should not be there (foreign body). A sore throat. Tooth problems. Jaw problems. Treatment of the earache will depend on the cause. If the cause is not clear or cannot be known, you may need to watch your symptoms until your earache goes away or until a cause is found. Follow these instructions at home: Medicines Take or apply over-the-counter and prescription medicines only as told by your health care provider. If you were prescribed antibiotics, use them as told by your health care provider. Do not stop using the antibiotic even if you start to feel better. Do not put anything in your ear other than medicine that is prescribed by your health care provider. Managing pain     If directed, apply heat to the affected area as often as told by your health care provider. Use the heat source that your health care provider recommends, such as a moist heat pack or a heating pad. Place a towel between your skin and the heat source. Leave the heat on for 20-30 minutes. If your skin turns bright red, remove the heat right away to prevent burns. The risk of burns is higher if you cannot feel pain, heat, or cold. If directed, put ice on the affected area. To do this: Put ice in a plastic bag. Place a towel between your skin and the bag. Leave the ice on for 20 minutes, 2-3 times a day. If your skin turns bright red, remove the ice right away to prevent skin damage. The risk of skin damage is higher if you cannot feel pain, heat, or cold.  General instructions Pay attention to any changes in your symptoms. Try resting in an upright position instead of lying down. This may help to reduce pressure in your ear and relieve pain. Chew gum if it helps to relieve your ear pain. Treat any allergies as told by your health care provider. Drink enough fluid  to keep your urine pale yellow. It is up to you to get the results of any tests that were done. Ask your health care provider, or the department that is doing the tests, when your results will be ready. Contact a health care provider if: Your pain does not improve within 2 days. Your earache gets worse. You have new symptoms. You have a fever. Get help right away if: You have a severe headache. You have a stiff neck. You have trouble swallowing. You have redness or swelling behind your ear. You have fluid or blood coming from your ear. You have hearing loss. You feel dizzy. This information is not intended to replace advice given to you by your health care provider. Make sure you discuss any questions you have with your health care provider. Document Revised: 04/06/2022 Document Reviewed: 04/06/2022 Elsevier Patient Education  2024 [employer]  

## 2024-05-31 NOTE — Assessment & Plan Note (Signed)
 Secondary to otitis externa Pain management discussed Recommend Tylenol  and or Advil  as needed VoSoL otic drops as prescribed

## 2024-05-31 NOTE — Progress Notes (Signed)
 Anne Mills 78 y.o.   Chief Complaint  Patient presents with   Ear Pain    Patient here for left ear pain that started yesterday. States its a pulse shocking feeling. She also mentions having a sore throat. No fever, no body aches.she is not taking any meds to help with these symptoms      HISTORY OF PRESENT ILLNESS: This is a 78 y.o. female complaining of pain to left ear which started yesterday  HPI   Prior to Admission medications   Medication Sig Start Date End Date Taking? Authorizing Provider  B Complex Vitamins (VITAMIN B-COMPLEX PO) Take by mouth. Liquid   Yes [provider]  Calcium  Carbonate-Vitamin D (CALCIUM  600 + D PO) Take 1 tablet by mouth daily.   Yes [provider]  Camphor-Menthol-Methyl Sal 1.2-5.7-6.3 % PTCH Apply topically.   Yes [provider]  Cholecalciferol 2000 UNITS TABS Take 1 tablet by mouth daily.   Yes [provider]  cyclobenzaprine  (FLEXERIL ) 5 MG tablet Take 1 tablet (5 mg total) by mouth at bedtime. 10/10/21  Yes   fluticasone  (FLONASE ) 50 MCG/ACT nasal spray Place 2 sprays into both nostrils daily. 02/09/23  Yes Kozlow, Eric J, MD  Folic Acid-Cholecalciferol 07-4998 MG-UNIT TABS daily at 6 (six) AM.   Yes [provider]  gabapentin  (NEURONTIN ) 300 MG capsule Take 1 capsule (300 mg total) by mouth 4 (four) times daily. 05/17/23 07/10/24 Yes Madolin Twaddle Jose, MD  ipratropium (ATROVENT ) 0.06 % nasal spray Place into both nostrils as needed for rhinitis.   Yes [provider]  Magnesium 500 MG CAPS Take by mouth.   Yes [provider]  Misc Natural Products (OSTEO BI-FLEX TRIPLE STRENGTH) TABS Take 2 tablets by mouth daily.   Yes [provider]  Multiple Vitamins-Minerals (STRESS TAB NF) TABS Take 1 tablet by mouth daily.   Yes [provider]  naproxen sodium (ALEVE) 220 MG tablet Take by mouth as needed (pain).   Yes [provider]  OVER THE COUNTER  MEDICATION Transitions w/Hesperidin vegetarian taking   Yes [provider]  OVER THE COUNTER MEDICATION Wal-Finate Dw/Pseudoephedrine taking   Yes [provider]  OVER THE COUNTER MEDICATION Take 470 mg by mouth daily. Butcher's broom root   Yes [provider]  Probiotic Product (PROBIOTIC PO) Take by mouth. 10 Billion Active Cultures   Yes [provider]  rosuvastatin  (CRESTOR ) 10 MG tablet TAKE 1 TABLET BY MOUTH EVERY DAY 05/01/24  Yes Falan Hensler Jose, MD  Trolamine Salicylate (ASPERCREME EX) Apply 1 application topically 2 (two) times daily as needed (for back pain).   Yes [provider]    Allergies  Allergen Reactions   Gluten Meal Cough   Biaxin [Clarithromycin] Nausea And Vomiting    Patient Active Problem List   Diagnosis Date Noted   Precordial pain 05-05-24   Family history of sudden cardiac death (SCD) 05/05/2024   Family history of cardiomyopathy 05/05/24   Exertional chest pain 01/18/2024   Chronic venous insufficiency 01/18/2024   Lower extremity edema 01/18/2024   Dyslipidemia 01/13/2022   DDD (degenerative disc disease), cervical 06/01/2019   Degeneration of lumbar or lumbosacral intervertebral disc 06/01/2019   Asthma 12/13/2017   Gastroesophageal reflux disease 12/13/2017   Idiopathic scoliosis and kyphoscoliosis 09/15/2017   Cervical stenosis (uterine cervix) 03/03/2013    Past Medical History:  Diagnosis Date   Allergy    Asthma    no inhaler   Back pain,  chronic    Chest pain    under left breast- 2-3 x per week: h/o breast surgery   Chronic cough 09/09/2023   GERD (gastroesophageal reflux disease)    Headache around the eyes 09/09/2023   Headache(784.0)    Heart murmur    not serious per pt-no echo   Post-nasal drainage 09/09/2023   Scoliosis    Seasonal allergies    Shortness of breath    per pt due to allergies    Past Surgical History:  Procedure Laterality Date   ABDOMINOPLASTY      BREAST SURGERY     lt cyst   COLONOSCOPY     DILATION AND CURETTAGE OF UTERUS N/A 03/03/2013   Procedure: DILATATION AND CURETTAGE;  Surgeon: Olam Mill, MD;  Location: [facility];  Service: Gynecology;  Laterality: N/A;   EXAMINATION UNDER ANESTHESIA N/A 03/03/2013   Procedure: EXAM UNDER ANESTHESIA with ultrasound;  Surgeon: Olam Mill, MD;  Location: [facility];  Service: Gynecology;  Laterality: N/A;   EYE SURGERY  10/21 ---10/7 /2021   catarct both eyes   TRIGGER FINGER RELEASE Left 06/21/2014   Procedure: LEFT THUMB TRIGGER RELEASE;  Surgeon: Franky JONELLE Curia, MD;  Location: [facility];  Service: Orthopedics;  Laterality: Left;    Social History   Socioeconomic History   Marital status: Married    Spouse name: Not on file   Number of children: Not on file   Years of education: Not on file   Highest education level: Not on file  Occupational History   Occupation: retired    Comment: house cleaning  Tobacco Use   Smoking status: Never   Smokeless tobacco: Never  Vaping Use   Vaping status: Never Used  Substance and Sexual Activity   Alcohol use: No   Drug use: No   Sexual activity: Yes    Birth control/protection: Post-menopausal  Other Topics Concern   Not on file  Social History Narrative   Marital status: married x 41 years; second marriage; happily married.  From [place] Left [place] 50; grew up [place].      Children:  2 daughters; 8 grandchildren; 10 gg.      Lives: with husband, daughter.      Employment: retired; clean homes      Tobacco; none      Alcohol: none      Drugs: none      Exercise: sporadic in 2019.  Joined [employer].      ADLs: independent with ADLs; no assistant devices.      Advanced Directives:  FULL CODE; no prolonged measures.         Social Drivers of Corporate investment banker Strain: Low Risk  (06/15/2023)   Overall Financial Resource Strain (CARDIA)    Difficulty of Paying Living Expenses: Not hard at all   Food Insecurity: No Food Insecurity (06/15/2023)   Hunger Vital Sign    Worried About Running Out of Food in the Last Year: Never true    Ran Out of Food in the Last Year: Never true  Transportation Needs: No Transportation Needs (06/15/2023)   PRAPARE - Administrator, Civil Service (Medical): No    Lack of Transportation (Non-Medical): No  Physical Activity: Sufficiently Active (06/15/2023)   Exercise Vital Sign    Days of Exercise per Week: 2 days    Minutes of Exercise per Session: 120 min  Stress: No Stress Concern Present (06/15/2023)   [place]  Institute of Occupational Health - Occupational Stress Questionnaire    Feeling of Stress : Not at all  Social Connections: Socially Integrated (06/15/2023)   Social Connection and Isolation Panel    Frequency of Communication with Friends and Family: More than three times a week    Frequency of Social Gatherings with Friends and Family: More than three times a week    Attends Religious Services: More than 4 times per year    Active Member of [employer] or Organizations: Yes    Attends Engineer, structural: More than 4 times per year    Marital Status: Married  Catering manager Violence: Not At Risk (06/15/2023)   Humiliation, Afraid, Rape, and Kick questionnaire    Fear of Current or Ex-Partner: No    Emotionally Abused: No    Physically Abused: No    Sexually Abused: No    Family History  Problem Relation Age of Onset   Dementia Mother    Hypertension Mother    Cancer Mother 32       Breast cancer   Depression Mother    Cancer Sister    Diabetes Sister    Heart disease Sister 60       AMI as cause of death   Hyperlipidemia Sister    Hypertension Sister    Mental retardation Sister    Cancer Father        prostate cancer   Heart disease Sister 1       AMI   Diabetes Sister      Review of Systems  Constitutional: Negative.  Negative for chills and fever.  HENT:  Positive for congestion and ear pain.    Respiratory: Negative.  Negative for cough and shortness of breath.   Cardiovascular: Negative.  Negative for chest pain and palpitations.  Gastrointestinal:  Negative for abdominal pain, diarrhea, nausea and vomiting.  Genitourinary: Negative.  Negative for dysuria and hematuria.  Skin: Negative.  Negative for rash.  Neurological: Negative.  Negative for dizziness and headaches.  All other systems reviewed and are negative.   Vitals:   05/31/24 1312  BP: 112/62  Pulse: 79  Temp: 98.6 F (37 C)  SpO2: 97%    Physical Exam Vitals reviewed.  Constitutional:      Appearance: Normal appearance.  HENT:     Head: Normocephalic.     Right Ear: Tympanic membrane, ear canal and external ear normal.     Left Ear: Tympanic membrane and ear canal normal.     Ears:     Comments: Hyperemic and tender external canal    Mouth/Throat:     Mouth: Mucous membranes are moist.     Pharynx: Oropharynx is clear.   Eyes:     Extraocular Movements: Extraocular movements intact.     Conjunctiva/sclera: Conjunctivae normal.    Cardiovascular:     Rate and Rhythm: Normal rate and regular rhythm.     Pulses: Normal pulses.     Heart sounds: Normal heart sounds.  Pulmonary:     Effort: Pulmonary effort is normal.     Breath sounds: Normal breath sounds.   Musculoskeletal:     Cervical back: No tenderness.  Lymphadenopathy:     Cervical: No cervical adenopathy.   Skin:    General: Skin is warm and dry.     Capillary Refill: Capillary refill takes less than 2 seconds.   Neurological:     General: No focal deficit present.     Mental Status: She  is alert and oriented to person, place, and time.   Psychiatric:        Mood and Affect: Mood normal.        Behavior: Behavior normal.      ASSESSMENT & PLAN: A total of 32 minutes was spent with the patient and counseling/coordination of care regarding preparing for this visit, review of most recent office visit notes, review of chronic  medical conditions under management, review of all medications, diagnosis of left otitis externa and need for antibiotics and eardrops, symptom management, prognosis, documentation and need for follow-up if no better or worse during the next several days.  Problem List Items Addressed This Visit       Nervous and Auditory   Acute otalgia, left - Primary   Secondary to otitis externa Pain management discussed Recommend Tylenol  and or Advil  as needed VoSoL otic drops as prescribed      Relevant Medications   acetic acid-hydrocortisone (VOSOL-HC) OTIC solution   cefUROXime (CEFTIN) 250 MG tablet   Acute otitis externa of left ear   Causing left earache Management discussed Recommend to start Ceftin 250 mg twice a day along with VoSoL otic drops 3-4 times a day for 7 days Advised to contact the office if no better or worse during the next several days.      Relevant Medications   acetic acid-hydrocortisone (VOSOL-HC) OTIC solution   cefUROXime (CEFTIN) 250 MG tablet   Patient Instructions  Earache, Adult An earache, or ear pain, can be caused by many things, including: An infection. Ear wax buildup. Ear pressure. Something in the ear that should not be there (foreign body). A sore throat. Tooth problems. Jaw problems. Treatment of the earache will depend on the cause. If the cause is not clear or cannot be known, you may need to watch your symptoms until your earache goes away or until a cause is found. Follow these instructions at home: Medicines Take or apply over-the-counter and prescription medicines only as told by your health care provider. If you were prescribed antibiotics, use them as told by your health care provider. Do not stop using the antibiotic even if you start to feel better. Do not put anything in your ear other than medicine that is prescribed by your health care provider. Managing pain     If directed, apply heat to the affected area as often as told by  your health care provider. Use the heat source that your health care provider recommends, such as a moist heat pack or a heating pad. Place a towel between your skin and the heat source. Leave the heat on for 20-30 minutes. If your skin turns bright red, remove the heat right away to prevent burns. The risk of burns is higher if you cannot feel pain, heat, or cold. If directed, put ice on the affected area. To do this: Put ice in a plastic bag. Place a towel between your skin and the bag. Leave the ice on for 20 minutes, 2-3 times a day. If your skin turns bright red, remove the ice right away to prevent skin damage. The risk of skin damage is higher if you cannot feel pain, heat, or cold.  General instructions Pay attention to any changes in your symptoms. Try resting in an upright position instead of lying down. This may help to reduce pressure in your ear and relieve pain. Chew gum if it helps to relieve your ear pain. Treat any allergies as told by your health  care provider. Drink enough fluid to keep your urine pale yellow. It is up to you to get the results of any tests that were done. Ask your health care provider, or the department that is doing the tests, when your results will be ready. Contact a health care provider if: Your pain does not improve within 2 days. Your earache gets worse. You have new symptoms. You have a fever. Get help right away if: You have a severe headache. You have a stiff neck. You have trouble swallowing. You have redness or swelling behind your ear. You have fluid or blood coming from your ear. You have hearing loss. You feel dizzy. This information is not intended to replace advice given to you by your health care provider. Make sure you discuss any questions you have with your health care provider. Document Revised: 04/06/2022 Document Reviewed: 04/06/2022 Elsevier Patient Education  2024 Elsevier Inc.      Emil Schaumann, MD [place]  [facility]

## 2024-05-31 NOTE — Assessment & Plan Note (Signed)
 Causing left earache Management discussed Recommend to start Ceftin 250 mg twice a day along with VoSoL otic drops 3-4 times a day for 7 days Advised to contact the office if no better or worse during the next several days.

## 2024-06-05 ENCOUNTER — Other Ambulatory Visit (HOSPITAL_COMMUNITY): Payer: Self-pay

## 2024-06-05 MED ORDER — ROSUVASTATIN CALCIUM 10 MG PO TABS
10.0000 mg | ORAL_TABLET | Freq: Every day | ORAL | 3 refills | Status: DC
  Filled 2024-06-05 (×2): qty 90, 90d supply, fill #0

## 2024-06-05 MED ORDER — GABAPENTIN 300 MG PO CAPS
300.0000 mg | ORAL_CAPSULE | Freq: Four times a day (QID) | ORAL | 3 refills | Status: DC
  Filled 2024-06-05 – 2024-08-14 (×2): qty 360, 90d supply, fill #0

## 2024-06-14 DIAGNOSIS — Z13828 Encounter for screening for other musculoskeletal disorder: Secondary | ICD-10-CM | POA: Diagnosis not present

## 2024-06-20 ENCOUNTER — Ambulatory Visit (INDEPENDENT_AMBULATORY_CARE_PROVIDER_SITE_OTHER): Payer: PPO

## 2024-06-20 VITALS — Ht 61.0 in | Wt 111.0 lb

## 2024-06-20 DIAGNOSIS — Z0100 Encounter for examination of eyes and vision without abnormal findings: Secondary | ICD-10-CM

## 2024-06-20 DIAGNOSIS — Z0000 Encounter for general adult medical examination without abnormal findings: Secondary | ICD-10-CM | POA: Diagnosis not present

## 2024-06-20 NOTE — Progress Notes (Signed)
 Subjective:   Anne Mills is a 78 y.o. who presents for a Medicare Wellness preventive visit.  As a reminder, Annual Wellness Visits don't include a physical exam, and some assessments may be limited, especially if this visit is performed virtually. We may recommend an in-person follow-up visit with your provider if needed.  Visit Complete: Virtual I connected with  Anne Mills on 06/20/24 by a audio enabled telemedicine application and verified that I am speaking with the correct person using two identifiers.  Patient Location: Home  Provider Location: Office/Clinic  I discussed the limitations of evaluation and management by telemedicine. The patient expressed understanding and agreed to proceed.  Vital Signs: Because this visit was a virtual/telehealth visit, some criteria may be missing or patient reported. Any vitals not documented were not able to be obtained and vitals that have been documented are patient reported.  VideoDeclined- This patient declined Librarian, academic. Therefore the visit was completed with audio only.  Persons Participating in Visit: Patient.  AWV Questionnaire: No: Patient Medicare AWV questionnaire was not completed prior to this visit.  Cardiac Risk Factors include: advanced age (>33men, >59 women);dyslipidemia     Objective:    Today's Vitals   06/20/24 1503  Weight: 111 lb (50.3 kg)  Height: 5' 1 (1.549 m)   Body mass index is 20.97 kg/m.     06/20/2024    3:03 PM 06/15/2023    3:45 PM 01/10/2020    1:10 PM 02/13/2019   11:44 AM 06/18/2014    4:53 PM 02/27/2013   11:06 AM  Advanced Directives  Does Patient Have a Medical Advance Directive? Yes Yes No Yes  Patient does not have advance directive;Patient would not like information  Patient has advance directive, copy not in chart   Type of Advance Directive Healthcare Power of [place];Living will Healthcare Power of [place];Living will  Healthcare Power of  [employer] Power of Attorney   Does patient want to make changes to medical advance directive?    No - Patient declined     Copy of Healthcare Power of Attorney in Chart? No - copy requested No - copy requested      Would patient like information on creating a medical advance directive?   Yes (Inpatient - patient defers creating a medical advance directive at this time - Information given)        Data saved with a previous flowsheet row definition    Current Medications (verified) Outpatient Encounter Medications as of 06/20/2024  Medication Sig   acetic acid -hydrocortisone  (VOSOL -HC) OTIC solution Place 3 drops into the left ear 3 (three) times daily.   B Complex Vitamins (VITAMIN B-COMPLEX PO) Take by mouth. Liquid   Calcium  Carbonate-Vitamin D (CALCIUM  600 + D PO) Take 1 tablet by mouth daily.   Camphor-Menthol-Methyl Sal 1.2-5.7-6.3 % PTCH Apply topically.   Cholecalciferol 2000 UNITS TABS Take 1 tablet by mouth daily.   cyclobenzaprine  (FLEXERIL ) 5 MG tablet Take 1 tablet (5 mg total) by mouth at bedtime.   fluticasone  (FLONASE ) 50 MCG/ACT nasal spray Place 2 sprays into both nostrils daily.   Folic Acid-Cholecalciferol 12-4998 MG-UNIT TABS daily at 6 (six) AM.   gabapentin  (NEURONTIN ) 300 MG capsule Take 1 capsule (300 mg total) by mouth 4 (four) times daily.   ipratropium (ATROVENT ) 0.06 % nasal spray Place into both nostrils as needed for rhinitis.   Magnesium 500 MG CAPS Take by mouth.   Misc Natural Products (OSTEO BI-FLEX TRIPLE  STRENGTH) TABS Take 2 tablets by mouth daily.   Multiple Vitamins-Minerals (STRESS TAB NF) TABS Take 1 tablet by mouth daily.   naproxen sodium (ALEVE) 220 MG tablet Take by mouth as needed (pain).   OVER THE COUNTER MEDICATION Transitions w/Hesperidin vegetarian taking   OVER THE COUNTER MEDICATION Wal-Finate Dw/Pseudoephedrine taking   OVER THE COUNTER MEDICATION Take 470 mg by mouth daily. Butcher's broom root   Probiotic Product (PROBIOTIC  PO) Take by mouth. 10 Billion Active Cultures   rosuvastatin  (CRESTOR ) 10 MG tablet Take 1 tablet (10 mg total) by mouth daily.   Trolamine Salicylate (ASPERCREME EX) Apply 1 application topically 2 (two) times daily as needed (for back pain).   No facility-administered encounter medications on file as of 06/20/2024.    Allergies (verified) Gluten meal and Biaxin [clarithromycin]   History: Past Medical History:  Diagnosis Date   Allergy    Asthma    no inhaler   Back pain, chronic    Chest pain    under left breast- 2-3 x per week: h/o breast surgery   Chronic cough 09/09/2023   GERD (gastroesophageal reflux disease)    Headache around the eyes 09/09/2023   Headache(784.0)    Heart murmur    not serious per pt-no echo   Post-nasal drainage 09/09/2023   Scoliosis    Seasonal allergies    Shortness of breath    per pt due to allergies   Past Surgical History:  Procedure Laterality Date   ABDOMINOPLASTY     BREAST SURGERY     lt cyst   COLONOSCOPY     DILATION AND CURETTAGE OF UTERUS N/A 03/03/2013   Procedure: DILATATION AND CURETTAGE;  Surgeon: Olam Mill, MD;  Location: [facility];  Service: Gynecology;  Laterality: N/A;   EXAMINATION UNDER ANESTHESIA N/A 03/03/2013   Procedure: EXAM UNDER ANESTHESIA with ultrasound;  Surgeon: Olam Mill, MD;  Location: [facility];  Service: Gynecology;  Laterality: N/A;   EYE SURGERY  10/21 ---10/7 /2021   catarct both eyes   TRIGGER FINGER RELEASE Left 06/21/2014   Procedure: LEFT THUMB TRIGGER RELEASE;  Surgeon: Franky JONELLE Curia, MD;  Location: [facility];  Service: Orthopedics;  Laterality: Left;   Family History  Problem Relation Age of Onset   Dementia Mother    Hypertension Mother    Cancer Mother 48       Breast cancer   Depression Mother    Cancer Sister    Diabetes Sister    Heart disease Sister 50       AMI as cause of death   Hyperlipidemia Sister    Hypertension Sister    Mental retardation  Sister    Cancer Father        prostate cancer   Heart disease Sister 77       AMI   Diabetes Sister    Social History   Socioeconomic History   Marital status: Married    Spouse name: Not on file   Number of children: Not on file   Years of education: Not on file   Highest education level: Not on file  Occupational History   Occupation: retired    Comment: house cleaning  Tobacco Use   Smoking status: Never   Smokeless tobacco: Never  Vaping Use   Vaping status: Never Used  Substance and Sexual Activity   Alcohol use: No   Drug use: No   Sexual activity: Yes    Birth control/protection: Post-menopausal  Other Topics Concern   Not on file  Social History Narrative   Marital status: married x 41 years; second marriage; happily married.  From [place] 80; grew up [place].      Children:  2 daughters; 8 grandchildren; 10 gg.      Lives: with husband, daughter.      Employment: retired; clean homes      Tobacco; none      Alcohol: none      Drugs: none      Exercise: sporadic in 2019.  Joined [employer].      ADLs: independent with ADLs; no assistant devices.      Advanced Directives:  FULL CODE; no prolonged measures.         Social Drivers of Corporate investment banker Strain: Low Risk  (06/20/2024)   Overall Financial Resource Strain (CARDIA)    Difficulty of Paying Living Expenses: Not hard at all  Food Insecurity: No Food Insecurity (06/20/2024)   Hunger Vital Sign    Worried About Running Out of Food in the Last Year: Never true    Ran Out of Food in the Last Year: Never true  Transportation Needs: No Transportation Needs (06/20/2024)   PRAPARE - Administrator, Civil Service (Medical): No    Lack of Transportation (Non-Medical): No  Physical Activity: Inactive (06/20/2024)   Exercise Vital Sign    Days of Exercise per Week: 0 days    Minutes of Exercise per Session: 0 min  Stress: No Stress Concern Present (06/20/2024)   [employer] of Occupational Health - Occupational Stress Questionnaire    Feeling of Stress: Not at all  Social Connections: Socially Integrated (06/20/2024)   Social Connection and Isolation Panel    Frequency of Communication with Friends and Family: More than three times a week    Frequency of Social Gatherings with Friends and Family: More than three times a week    Attends Religious Services: More than 4 times per year    Active Member of [employer] or Organizations: Yes    Attends Engineer, structural: More than 4 times per year    Marital Status: Married    Tobacco Counseling Counseling given: No    Clinical Intake:  Pre-visit preparation completed: Yes  Pain : No/denies pain     BMI - recorded: 20.97 Nutritional Status: BMI of 19-24  Normal Nutritional Risks: None Diabetes: No  Lab Results  Component Value Date   HGBA1C 5.8 06/16/2023   HGBA1C 5.7 01/12/2022     How often do you need to have someone help you when you read instructions, pamphlets, or other written materials from your doctor or pharmacy?: 1 - Never  Interpreter Needed?: No  Information entered by :: Verdie Saba, CMA   Activities of Daily Living     06/20/2024    3:08 PM  In your present state of health, do you have any difficulty performing the following activities:  Hearing? 0  Vision? 0  Difficulty concentrating or making decisions? 0  Walking or climbing stairs? 0  Dressing or bathing? 0  Doing errands, shopping? 0  Preparing Food and eating ? N  Using the Toilet? N  In the past six months, have you accidently leaked urine? N  Do you have problems with loss of bowel control? N  Managing your Medications? N  Managing your Finances? N  Housekeeping or managing your Housekeeping? N    Patient Care  Team: Sagardia, Miguel Jose, MD as PCP - General (Internal Medicine)  I have updated your Care Teams any recent Medical Services you may have received from other providers in the  past year.     Assessment:   This is a routine wellness examination for Anne Mills.  Hearing/Vision screen Hearing Screening - Comments:: Denies hearing difficulties   Vision Screening - Comments:: Wears rx glasses - up to date with routine eye exams with an Opthalmologist   Goals Addressed               This Visit's Progress     Patient Stated (pt-stated)        Patient stated she plans to continue to do stretches.       Depression Screen     06/20/2024    3:09 PM 05/31/2024    1:25 PM 01/18/2024    2:52 PM 06/16/2023    3:21 PM 06/15/2023    3:41 PM 03/08/2023   11:14 AM 09/24/2022    1:58 PM  PHQ 2/9 Scores  PHQ - 2 Score 0 0 0 0 0 0 0  PHQ- 9 Score 0          Fall Risk     06/20/2024    3:08 PM 05/31/2024    1:25 PM 01/18/2024    2:52 PM 06/16/2023    3:21 PM 06/15/2023    3:43 PM  Fall Risk   Falls in the past year? 1 0 0 1 1  Number falls in past yr: 0 0 0 0 0  Comment 1      Injury with Fall? 0 0 0 0 0  Risk for fall due to :  No Fall Risks No Fall Risks History of fall(s) No Fall Risks  Follow up Falls evaluation completed;Falls prevention discussed Falls evaluation completed Falls evaluation completed Falls evaluation completed Falls prevention discussed    MEDICARE RISK AT HOME:  Medicare Risk at Home Any stairs in or around the home?: No If so, are there any without handrails?: No Home free of loose throw rugs in walkways, pet beds, electrical cords, etc?: Yes Adequate lighting in your home to reduce risk of falls?: Yes Life alert?: No Use of a cane, walker or w/c?: No Grab bars in the bathroom?: Yes Shower chair or bench in shower?: Yes Elevated toilet seat or a handicapped toilet?: No  TIMED UP AND GO:  Was the test performed?  No  Cognitive Function: 6CIT completed        06/20/2024    3:12 PM 06/15/2023    3:46 PM 01/10/2020    1:13 PM  6CIT Screen  What Year? 0 points 0 points 0 points  What month? 0 points 0 points 0 points  What time? 0  points 0 points 0 points  Count back from 20 0 points 0 points 0 points  Months in reverse 0 points 0 points 0 points  Repeat phrase 0 points 2 points 0 points  Total Score 0 points 2 points 0 points    Immunizations Immunization History  Administered Date(s) Administered   Fluad Quad(high Dose 65+) 10/02/2019, 10/07/2020, 09/24/2022   Influenza, High Dose Seasonal PF 10/17/2018, 10/07/2020, 09/02/2021, 11/01/2023   Influenza,inj,Quad PF,6+ Mos 08/22/2014, 10/06/2016, 09/15/2017   Influenza,inj,quad, With Preservative 10/30/2015, 09/06/2020   Influenza-Unspecified 10/06/2016   PFIZER(Purple Top)SARS-COV-2 Vaccination 12/29/2019, 01/19/2020, 09/06/2020, 08/07/2021, 08/25/2021   PNEUMOCOCCAL CONJUGATE-20 09/02/2021   Pneumococcal Conjugate-13 07/24/2015   Pneumococcal Polysaccharide-23 07/24/2015, 09/15/2017   Td 07/24/2015  Td (Adult), 2 Lf Tetanus Toxid, Preservative Free 07/24/2015   Tdap 07/24/2015   Zoster Recombinant(Shingrix) 02/10/2022, 09/14/2022   Zoster, Live 12/08/2007    Screening Tests Health Maintenance  Topic Date Due   COVID-19 Vaccine (6 - 2024-25 season) 08/08/2023   INFLUENZA VACCINE  07/07/2024   Medicare Annual Wellness (AWV)  06/20/2025   DTaP/Tdap/Td (3 - Td or Tdap) 07/23/2025   Pneumococcal Vaccine: 50+ Years  Completed   DEXA SCAN  Completed   Hepatitis C Screening  Completed   Zoster Vaccines- Shingrix  Completed   Hepatitis B Vaccines  Aged Out   HPV VACCINES  Aged Out   Meningococcal B Vaccine  Aged Out   Colonoscopy  Discontinued    Health Maintenance  Health Maintenance Due  Topic Date Due   COVID-19 Vaccine (6 - 2024-25 season) 08/08/2023   Health Maintenance Items Addressed:06/20/2024   Additional Screening:  Vision Screening: Recommended annual ophthalmology exams for early detection of glaucoma and other disorders of the eye. Would you like a referral to an eye doctor? No  Patient stated she had an eye exam 2wks ago with an  Ophthalmologist (unable to recall name).  Dental Screening: Recommended annual dental exams for proper oral hygiene  Community Resource Referral / Chronic Care Management: CRR required this visit?  No   CCM required this visit?  No   Plan:    I have personally reviewed and noted the following in the patient's chart:   Medical and social history Use of alcohol, tobacco or illicit drugs  Current medications and supplements including opioid prescriptions. Patient is not currently taking opioid prescriptions. Functional ability and status Nutritional status Physical activity Advanced directives List of other physicians Hospitalizations, surgeries, and ER visits in previous 12 months Vitals Screenings to include cognitive, depression, and falls Referrals and appointments  In addition, I have reviewed and discussed with patient certain preventive protocols, quality metrics, and best practice recommendations. A written personalized care plan for preventive services as well as general preventive health recommendations were provided to patient.   Verdie CHRISTELLA Saba, CMA   06/20/2024   After Visit Summary: (MyChart) Due to this being a telephonic visit, the after visit summary with patients personalized plan was offered to patient via MyChart   Notes: Scheduled a CPE for the pt w/PCP for 06/16/2024.

## 2024-06-20 NOTE — Patient Instructions (Addendum)
 Ms. Anne Mills , Thank you for taking time out of your busy schedule to complete your Annual Wellness Visit with me. I enjoyed our conversation and look forward to speaking with you again next year. I, as well as your care team,  appreciate your ongoing commitment to your health goals. Please review the following plan we discussed and let me know if I can assist you in the future. Your Game plan/ To Do List   Follow up Visits: Next Medicare AWV with our clinical staff: 06/27/2025   Have you seen your provider in the last 6 months (3 months if uncontrolled diabetes)? Yes Next Office Visit with your provider: 06/16/2024  Clinician Recommendations:  Aim for 30 minutes of exercise or brisk walking, 6-8 glasses of water, and 5 servings of fruits and vegetables each day.       This is a list of the screening recommended for you and due dates:  Health Maintenance  Topic Date Due   COVID-19 Vaccine (6 - 2024-25 season) 08/08/2023   Flu Shot  07/07/2024   Medicare Annual Wellness Visit  06/20/2025   DTaP/Tdap/Td vaccine (3 - Td or Tdap) 07/23/2025   Pneumococcal Vaccine for age over 33  Completed   DEXA scan (bone density measurement)  Completed   Hepatitis C Screening  Completed   Zoster (Shingles) Vaccine  Completed   Hepatitis B Vaccine  Aged Out   HPV Vaccine  Aged Out   Meningitis B Vaccine  Aged Out   Colon Cancer Screening  Discontinued    Advanced directives: (Copy Requested) Please bring a copy of your health care power of attorney and living will to the office to be added to your chart at your convenience. You can mail to [facility] [address] [place], [place] [address] or email to [email or web address] Advance Care Planning is important because it:  [x]  Makes sure you receive the medical care that is consistent with your values, goals, and preferences  [x]  It provides guidance to your family and loved ones and reduces their decisional burden about whether or  not they are making the right decisions based on your wishes.  Follow the link provided in your after visit summary or read over the paperwork we have mailed to you to help you started getting your Advance Directives in place. If you need assistance in completing these, please reach out to us  so that we can help you!

## 2024-06-28 ENCOUNTER — Encounter: Admitting: Emergency Medicine

## 2024-07-04 ENCOUNTER — Ambulatory Visit: Payer: Self-pay | Admitting: Emergency Medicine

## 2024-07-04 ENCOUNTER — Encounter: Payer: Self-pay | Admitting: Emergency Medicine

## 2024-07-04 ENCOUNTER — Ambulatory Visit (INDEPENDENT_AMBULATORY_CARE_PROVIDER_SITE_OTHER): Admitting: Emergency Medicine

## 2024-07-04 VITALS — BP 108/60 | HR 80 | Temp 98.0°F | Ht 61.0 in | Wt 116.0 lb

## 2024-07-04 DIAGNOSIS — E785 Hyperlipidemia, unspecified: Secondary | ICD-10-CM

## 2024-07-04 DIAGNOSIS — Z1329 Encounter for screening for other suspected endocrine disorder: Secondary | ICD-10-CM

## 2024-07-04 DIAGNOSIS — Z13228 Encounter for screening for other metabolic disorders: Secondary | ICD-10-CM | POA: Diagnosis not present

## 2024-07-04 DIAGNOSIS — M4120 Other idiopathic scoliosis, site unspecified: Secondary | ICD-10-CM | POA: Diagnosis not present

## 2024-07-04 DIAGNOSIS — Z130 Encounter for screening for diseases of the blood and blood-forming organs and certain disorders involving the immune mechanism: Secondary | ICD-10-CM

## 2024-07-04 DIAGNOSIS — I872 Venous insufficiency (chronic) (peripheral): Secondary | ICD-10-CM

## 2024-07-04 DIAGNOSIS — K219 Gastro-esophageal reflux disease without esophagitis: Secondary | ICD-10-CM | POA: Diagnosis not present

## 2024-07-04 DIAGNOSIS — Z0001 Encounter for general adult medical examination with abnormal findings: Secondary | ICD-10-CM

## 2024-07-04 DIAGNOSIS — Z0000 Encounter for general adult medical examination without abnormal findings: Secondary | ICD-10-CM | POA: Diagnosis not present

## 2024-07-04 LAB — CBC WITH DIFFERENTIAL/PLATELET
Basophils Absolute: 0.1 K/uL (ref 0.0–0.1)
Basophils Relative: 1.7 % (ref 0.0–3.0)
Eosinophils Absolute: 0.1 K/uL (ref 0.0–0.7)
Eosinophils Relative: 2.8 % (ref 0.0–5.0)
HCT: 39.5 % (ref 36.0–46.0)
Hemoglobin: 13.2 g/dL (ref 12.0–15.0)
Lymphocytes Relative: 29.8 % (ref 12.0–46.0)
Lymphs Abs: 1.2 K/uL (ref 0.7–4.0)
MCHC: 33.4 g/dL (ref 30.0–36.0)
MCV: 90 fl (ref 78.0–100.0)
Monocytes Absolute: 0.5 K/uL (ref 0.1–1.0)
Monocytes Relative: 11.6 % (ref 3.0–12.0)
Neutro Abs: 2.2 K/uL (ref 1.4–7.7)
Neutrophils Relative %: 54.1 % (ref 43.0–77.0)
Platelets: 205 K/uL (ref 150.0–400.0)
RBC: 4.39 Mil/uL (ref 3.87–5.11)
RDW: 12.6 % (ref 11.5–15.5)
WBC: 4.1 K/uL (ref 4.0–10.5)

## 2024-07-04 LAB — HEMOGLOBIN A1C: Hgb A1c MFr Bld: 6 % (ref 4.6–6.5)

## 2024-07-04 LAB — COMPREHENSIVE METABOLIC PANEL WITH GFR
ALT: 19 U/L (ref 0–35)
AST: 24 U/L (ref 0–37)
Albumin: 4.1 g/dL (ref 3.5–5.2)
Alkaline Phosphatase: 90 U/L (ref 39–117)
BUN: 12 mg/dL (ref 6–23)
CO2: 27 meq/L (ref 19–32)
Calcium: 9 mg/dL (ref 8.4–10.5)
Chloride: 108 meq/L (ref 96–112)
Creatinine, Ser: 0.59 mg/dL (ref 0.40–1.20)
GFR: 86.98 mL/min (ref >60.00)
Glucose, Bld: 99 mg/dL (ref 70–99)
Potassium: 4 meq/L (ref 3.5–5.1)
Sodium: 142 meq/L (ref 135–145)
Total Bilirubin: 0.5 mg/dL (ref 0.2–1.2)
Total Protein: 6.6 g/dL (ref 6.0–8.3)

## 2024-07-04 LAB — LIPID PANEL
Cholesterol: 130 mg/dL (ref 0–200)
HDL: 53.9 mg/dL (ref >39.00)
LDL Cholesterol: 52 mg/dL (ref 0–99)
NonHDL: 76.58
Total CHOL/HDL Ratio: 2
Triglycerides: 124 mg/dL (ref 0.0–149.0)
VLDL: 24.8 mg/dL (ref 0.0–40.0)

## 2024-07-04 NOTE — Assessment & Plan Note (Addendum)
 With occasional lumbar pain and muscle spasm Chronic pain management discussed Has been on gabapentin  daily for years

## 2024-07-04 NOTE — Assessment & Plan Note (Signed)
 Stable.  Not taking Protonix  at present time Better with over-the-counter remedies

## 2024-07-04 NOTE — Assessment & Plan Note (Signed)
 Stable.  Diet and nutrition discussed.  Continue rosuvastatin 10 mg daily.

## 2024-07-04 NOTE — Assessment & Plan Note (Signed)
 Intermittent bilateral leg edema No swelling today.  No signs of cellulitis Good peripheral arterial distal circulation

## 2024-07-04 NOTE — Progress Notes (Signed)
 Anne Mills 78 y.o.   Chief Complaint  Patient presents with   Annual Exam    Patient here for physical no other concerns     HISTORY OF PRESENT ILLNESS: This is a 78 y.o. female here for annual exam and follow-up of chronic medical conditions. Has chronic degenerative spine disease with intermittent/occasional low back pain episodes No other complaints or medical concerns today.  HPI   Prior to Admission medications   Medication Sig Start Date End Date Taking? Authorizing Provider  acetic acid -hydrocortisone  (VOSOL -HC) OTIC solution Place 3 drops into the left ear 3 (three) times daily. 05/31/24  Yes Jadamarie Butson, Emil Schanz, MD  B Complex Vitamins (VITAMIN B-COMPLEX PO) Take by mouth. Liquid   Yes [provider]  Calcium  Carbonate-Vitamin D (CALCIUM  600 + D PO) Take 1 tablet by mouth daily.   Yes [provider]  Camphor-Menthol-Methyl Sal 1.2-5.7-6.3 % PTCH Apply topically.   Yes [provider]  Cholecalciferol 2000 UNITS TABS Take 1 tablet by mouth daily.   Yes [provider]  fluticasone  (FLONASE ) 50 MCG/ACT nasal spray Place 2 sprays into both nostrils daily. 02/09/23  Yes Kozlow, Eric J, MD  Folic Acid-Cholecalciferol 07-4998 MG-UNIT TABS daily at 6 (six) AM.   Yes [provider]  gabapentin  (NEURONTIN ) 300 MG capsule Take 1 capsule (300 mg total) by mouth 4 (four) times daily. 06/05/24 05/31/25 Yes Visente Kirker, Emil Schanz, MD  Magnesium 500 MG CAPS Take by mouth.   Yes [provider]  Misc Natural Products (OSTEO BI-FLEX TRIPLE STRENGTH) TABS Take 2 tablets by mouth daily.   Yes [provider]  Multiple Vitamins-Minerals (STRESS TAB NF) TABS Take 1 tablet by mouth daily.   Yes [provider]  naproxen sodium (ALEVE) 220 MG tablet Take by mouth as needed (pain).   Yes [provider]  OVER THE COUNTER MEDICATION Transitions w/Hesperidin vegetarian taking   Yes [provider]  OVER THE  COUNTER MEDICATION Wal-Finate Dw/Pseudoephedrine taking   Yes [provider]  OVER THE COUNTER MEDICATION Take 470 mg by mouth daily. Butcher's broom root   Yes [provider]  Probiotic Product (PROBIOTIC PO) Take by mouth. 10 Billion Active Cultures   Yes [provider]  rosuvastatin  (CRESTOR ) 10 MG tablet Take 1 tablet (10 mg total) by mouth daily. 06/05/24  Yes Kambry Takacs Jose, MD  Trolamine Salicylate (ASPERCREME EX) Apply 1 application topically 2 (two) times daily as needed (for back pain).   Yes [provider]  cyclobenzaprine  (FLEXERIL ) 5 MG tablet Take 1 tablet (5 mg total) by mouth at bedtime. Patient not taking: Reported on 07/04/2024 10/10/21     ipratropium (ATROVENT ) 0.06 % nasal spray Place into both nostrils as needed for rhinitis. Patient not taking: Reported on 07/04/2024    [provider]    Allergies  Allergen Reactions   Gluten Meal Cough   Biaxin [Clarithromycin] Nausea And Vomiting    Patient Active Problem List   Diagnosis Date Noted   Family history of sudden cardiac death (SCD) May 12, 2024   Family history of cardiomyopathy 12-May-2024   Chronic venous insufficiency 01/18/2024   Lower extremity edema 01/18/2024   Dyslipidemia 01/13/2022   DDD (degenerative disc disease), cervical 06/01/2019   Degeneration of lumbar or lumbosacral intervertebral disc 06/01/2019   Asthma 12/13/2017   Gastroesophageal reflux disease 12/13/2017   Idiopathic scoliosis and kyphoscoliosis 09/15/2017   Cervical stenosis (uterine cervix) 03/03/2013    Past Medical History:  Diagnosis Date  Allergy    Asthma    no inhaler   Back pain, chronic    Chest pain    under left breast- 2-3 x per week: h/o breast surgery   Chronic cough 09/09/2023   GERD (gastroesophageal reflux disease)    Headache around the eyes 09/09/2023   Headache(784.0)    Heart murmur    not serious per pt-no echo   Post-nasal drainage 09/09/2023    Scoliosis    Seasonal allergies    Shortness of breath    per pt due to allergies    Past Surgical History:  Procedure Laterality Date   ABDOMINOPLASTY     BREAST SURGERY     lt cyst   COLONOSCOPY     DILATION AND CURETTAGE OF UTERUS N/A 03/03/2013   Procedure: DILATATION AND CURETTAGE;  Surgeon: Olam Mill, MD;  Location: [facility];  Service: Gynecology;  Laterality: N/A;   EXAMINATION UNDER ANESTHESIA N/A 03/03/2013   Procedure: EXAM UNDER ANESTHESIA with ultrasound;  Surgeon: Olam Mill, MD;  Location: [facility];  Service: Gynecology;  Laterality: N/A;   EYE SURGERY  10/21 ---10/7 /2021   catarct both eyes   TRIGGER FINGER RELEASE Left 06/21/2014   Procedure: LEFT THUMB TRIGGER RELEASE;  Surgeon: Franky JONELLE Curia, MD;  Location: [facility];  Service: Orthopedics;  Laterality: Left;    Social History   Socioeconomic History   Marital status: Married    Spouse name: Not on file   Number of children: Not on file   Years of education: Not on file   Highest education level: Not on file  Occupational History   Occupation: retired    Comment: house cleaning  Tobacco Use   Smoking status: Never   Smokeless tobacco: Never  Vaping Use   Vaping status: Never Used  Substance and Sexual Activity   Alcohol use: No   Drug use: No   Sexual activity: Yes    Birth control/protection: Post-menopausal  Other Topics Concern   Not on file  Social History Narrative   Marital status: married x 41 years; second marriage; happily married.  From [place] Left [place] age 78; grew up [place].      Children:  2 daughters; 8 grandchildren; 10 gg.      Lives: with husband, daughter.      Employment: retired; clean homes      Tobacco; none      Alcohol: none      Drugs: none      Exercise: sporadic in 2019.  Joined [employer].      ADLs: independent with ADLs; no assistant devices.      Advanced Directives:  FULL CODE; no prolonged measures.         Social Drivers of  Corporate investment banker Strain: Low Risk  (06/20/2024)   Overall Financial Resource Strain (CARDIA)    Difficulty of Paying Living Expenses: Not hard at all  Food Insecurity: No Food Insecurity (06/20/2024)   Hunger Vital Sign    Worried About Running Out of Food in the Last Year: Never true    Ran Out of Food in the Last Year: Never true  Transportation Needs: No Transportation Needs (06/20/2024)   PRAPARE - Administrator, Civil Service (Medical): No    Lack of Transportation (Non-Medical): No  Physical Activity: Inactive (06/20/2024)   Exercise Vital Sign    Days of Exercise per Week: 0 days    Minutes of Exercise per  Session: 0 min  Stress: No Stress Concern Present (06/20/2024)   [employer] of Occupational Health - Occupational Stress Questionnaire    Feeling of Stress: Not at all  Social Connections: Socially Integrated (06/20/2024)   Social Connection and Isolation Panel    Frequency of Communication with Friends and Family: More than three times a week    Frequency of Social Gatherings with Friends and Family: More than three times a week    Attends Religious Services: More than 4 times per year    Active Member of [employer] or Organizations: Yes    Attends Engineer, structural: More than 4 times per year    Marital Status: Married  Catering manager Violence: Not At Risk (06/20/2024)   Humiliation, Afraid, Rape, and Kick questionnaire    Fear of Current or Ex-Partner: No    Emotionally Abused: No    Physically Abused: No    Sexually Abused: No    Family History  Problem Relation Age of Onset   Dementia Mother    Hypertension Mother    Cancer Mother 63       Breast cancer   Depression Mother    Cancer Sister    Diabetes Sister    Heart disease Sister 44       AMI as cause of death   Hyperlipidemia Sister    Hypertension Sister    Mental retardation Sister    Cancer Father        prostate cancer   Heart disease Sister 36       AMI    Diabetes Sister      Review of Systems  Constitutional: Negative.  Negative for chills and fever.  HENT: Negative.  Negative for congestion and sore throat.   Respiratory: Negative.  Negative for cough and shortness of breath.   Cardiovascular: Negative.  Negative for chest pain and palpitations.  Gastrointestinal:  Negative for abdominal pain, diarrhea, nausea and vomiting.  Genitourinary: Negative.  Negative for dysuria and hematuria.  Musculoskeletal:  Positive for back pain.  Skin: Negative.  Negative for rash.  Neurological: Negative.  Negative for dizziness and headaches.  All other systems reviewed and are negative.   Today's Vitals   07/04/24 1342  BP: 108/60  Pulse: 80  Temp: 98 F (36.7 C)  TempSrc: Oral  SpO2: 96%  Weight: 116 lb (52.6 kg)  Height: 5' 1 (1.549 m)   Body mass index is 21.92 kg/m.   Physical Exam Vitals reviewed.  Constitutional:      Appearance: Normal appearance.  HENT:     Head: Normocephalic.     Right Ear: Tympanic membrane, ear canal and external ear normal.     Left Ear: Tympanic membrane, ear canal and external ear normal.     Mouth/Throat:     Mouth: Mucous membranes are moist.     Pharynx: Oropharynx is clear.  Eyes:     Extraocular Movements: Extraocular movements intact.     Conjunctiva/sclera: Conjunctivae normal.     Pupils: Pupils are equal, round, and reactive to light.  Cardiovascular:     Rate and Rhythm: Normal rate and regular rhythm.     Pulses: Normal pulses.     Heart sounds: Normal heart sounds.  Pulmonary:     Effort: Pulmonary effort is normal.     Breath sounds: Normal breath sounds.  Abdominal:     Palpations: Abdomen is soft.     Tenderness: There is no abdominal tenderness.  Musculoskeletal:  Cervical back: No tenderness.     Right lower leg: No edema.     Left lower leg: No edema.  Lymphadenopathy:     Cervical: No cervical adenopathy.  Skin:    General: Skin is warm and dry.     Capillary  Refill: Capillary refill takes less than 2 seconds.  Neurological:     General: No focal deficit present.     Mental Status: She is alert and oriented to person, place, and time.  Psychiatric:        Mood and Affect: Mood normal.        Behavior: Behavior normal.      ASSESSMENT & PLAN: Problem List Items Addressed This Visit       Cardiovascular and Mediastinum   Chronic venous insufficiency   Intermittent bilateral leg edema No swelling today.  No signs of cellulitis Good peripheral arterial distal circulation        Digestive   Gastroesophageal reflux disease   Stable.  Not taking Protonix  at present time Better with over-the-counter remedies        Musculoskeletal and Integument   Idiopathic scoliosis and kyphoscoliosis   With occasional lumbar pain and muscle spasm Chronic pain management discussed Has been on gabapentin  daily for years        Other   Dyslipidemia   Stable.  Diet and nutrition discussed. Continue rosuvastatin  10 mg daily.      Relevant Orders   Comprehensive metabolic panel with GFR   Lipid panel   Other Visit Diagnoses       Encounter for general adult medical examination with abnormal findings    -  Primary   Relevant Orders   CBC with Differential/Platelet   Comprehensive metabolic panel with GFR   Hemoglobin A1c   Lipid panel     Screening for deficiency anemia       Relevant Orders   CBC with Differential/Platelet     Screening for endocrine, metabolic and immunity disorder       Relevant Orders   Comprehensive metabolic panel with GFR   Hemoglobin A1c      Modifiable risk factors discussed with patient. Anticipatory guidance according to age provided. The following topics were also discussed: Social Determinants of Health Smoking.  Non-smoker Diet and nutrition Benefits of exercise Cancer screening and review of most recent mammogram and colonoscopy reports Vaccinations review and recommendations Cardiovascular  risk assessment Review of multiple chronic medical conditions Review of all medications Mental health including depression and anxiety Fall and accident prevention  Patient Instructions  Health Maintenance, Female Adopting a healthy lifestyle and getting preventive care are important in promoting health and wellness. Ask your health care provider about: The right schedule for you to have regular tests and exams. Things you can do on your own to prevent diseases and keep yourself healthy. What should I know about diet, weight, and exercise? Eat a healthy diet  Eat a diet that includes plenty of vegetables, fruits, low-fat dairy products, and lean protein. Do not eat a lot of foods that are high in solid fats, added sugars, or sodium. Maintain a healthy weight Body mass index (BMI) is used to identify weight problems. It estimates body fat based on height and weight. Your health care provider can help determine your BMI and help you achieve or maintain a healthy weight. Get regular exercise Get regular exercise. This is one of the most important things you can do for your health. Most adults should: Exercise  for at least 150 minutes each week. The exercise should increase your heart rate and make you sweat (moderate-intensity exercise). Do strengthening exercises at least twice a week. This is in addition to the moderate-intensity exercise. Spend less time sitting. Even light physical activity can be beneficial. Watch cholesterol and blood lipids Have your blood tested for lipids and cholesterol at 77 years of age, then have this test every 5 years. Have your cholesterol levels checked more often if: Your lipid or cholesterol levels are high. You are older than 77 years of age. You are at high risk for heart disease. What should I know about cancer screening? Depending on your health history and family history, you may need to have cancer screening at various ages. This may include  screening for: Breast cancer. Cervical cancer. Colorectal cancer. Skin cancer. Lung cancer. What should I know about heart disease, diabetes, and high blood pressure? Blood pressure and heart disease High blood pressure causes heart disease and increases the risk of stroke. This is more likely to develop in people who have high blood pressure readings or are overweight. Have your blood pressure checked: Every 3-5 years if you are 75-86 years of age. Every year if you are 46 years old or older. Diabetes Have regular diabetes screenings. This checks your fasting blood sugar level. Have the screening done: Once every three years after age 89 if you are at a normal weight and have a low risk for diabetes. More often and at a younger age if you are overweight or have a high risk for diabetes. What should I know about preventing infection? Hepatitis B If you have a higher risk for hepatitis B, you should be screened for this virus. Talk with your health care provider to find out if you are at risk for hepatitis B infection. Hepatitis C Testing is recommended for: Everyone born from [birth year] through 1964. Anyone with known risk factors for hepatitis C. Sexually transmitted infections (STIs) Get screened for STIs, including gonorrhea and chlamydia, if: You are sexually active and are younger than 77 years of age. You are older than 77 years of age and your health care provider tells you that you are at risk for this type of infection. Your sexual activity has changed since you were last screened, and you are at increased risk for chlamydia or gonorrhea. Ask your health care provider if you are at risk. Ask your health care provider about whether you are at high risk for HIV. Your health care provider may recommend a prescription medicine to help prevent HIV infection. If you choose to take medicine to prevent HIV, you should first get tested for HIV. You should then be tested every 3 months for as  long as you are taking the medicine. Pregnancy If you are about to stop having your period (premenopausal) and you may become pregnant, seek counseling before you get pregnant. Take 400 to 800 micrograms (mcg) of folic acid every day if you become pregnant. Ask for birth control (contraception) if you want to prevent pregnancy. Osteoporosis and menopause Osteoporosis is a disease in which the bones lose minerals and strength with aging. This can result in bone fractures. If you are 18 years old or older, or if you are at risk for osteoporosis and fractures, ask your health care provider if you should: Be screened for bone loss. Take a calcium  or vitamin D supplement to lower your risk of fractures. Be given hormone replacement therapy (HRT) to treat symptoms of  menopause. Follow these instructions at home: Alcohol use Do not drink alcohol if: Your health care provider tells you not to drink. You are pregnant, may be pregnant, or are planning to become pregnant. If you drink alcohol: Limit how much you have to: 0-1 drink a day. Know how much alcohol is in your drink. In the [place] one drink equals one 12 oz bottle of beer (355 mL), one 5 oz glass of wine (148 mL), or one 1 oz glass of hard liquor (44 mL). Lifestyle Do not use any products that contain nicotine or tobacco. These products include cigarettes, chewing tobacco, and vaping devices, such as e-cigarettes. If you need help quitting, ask your health care provider. Do not use street drugs. Do not share needles. Ask your health care provider for help if you need support or information about quitting drugs. General instructions Schedule regular health, dental, and eye exams. Stay current with your vaccines. Tell your health care provider if: You often feel depressed. You have ever been abused or do not feel safe at home. Summary Adopting a healthy lifestyle and getting preventive care are important in promoting health and  wellness. Follow your health care provider's instructions about healthy diet, exercising, and getting tested or screened for diseases. Follow your health care provider's instructions on monitoring your cholesterol and blood pressure. This information is not intended to replace advice given to you by your health care provider. Make sure you discuss any questions you have with your health care provider. Document Revised: 04/14/2021 Document Reviewed: 04/14/2021 Elsevier Patient Education  2024 Elsevier Inc.     Emil Schaumann, MD [facility]

## 2024-07-04 NOTE — Patient Instructions (Signed)

## 2024-08-12 DIAGNOSIS — M5489 Other dorsalgia: Secondary | ICD-10-CM | POA: Diagnosis not present

## 2024-08-14 ENCOUNTER — Other Ambulatory Visit (HOSPITAL_COMMUNITY): Payer: Self-pay

## 2024-09-15 DIAGNOSIS — M51362 Other intervertebral disc degeneration, lumbar region with discogenic back pain and lower extremity pain: Secondary | ICD-10-CM | POA: Diagnosis not present

## 2024-12-12 ENCOUNTER — Ambulatory Visit (INDEPENDENT_AMBULATORY_CARE_PROVIDER_SITE_OTHER): Admitting: Emergency Medicine

## 2024-12-12 ENCOUNTER — Encounter: Payer: Self-pay | Admitting: Emergency Medicine

## 2024-12-12 VITALS — BP 134/88 | HR 88 | Ht 61.0 in | Wt 112.0 lb

## 2024-12-12 DIAGNOSIS — J4520 Mild intermittent asthma, uncomplicated: Secondary | ICD-10-CM | POA: Diagnosis not present

## 2024-12-12 DIAGNOSIS — I872 Venous insufficiency (chronic) (peripheral): Secondary | ICD-10-CM | POA: Diagnosis not present

## 2024-12-12 DIAGNOSIS — E785 Hyperlipidemia, unspecified: Secondary | ICD-10-CM | POA: Diagnosis not present

## 2024-12-12 DIAGNOSIS — M51379 Other intervertebral disc degeneration, lumbosacral region without mention of lumbar back pain or lower extremity pain: Secondary | ICD-10-CM | POA: Diagnosis not present

## 2024-12-12 DIAGNOSIS — K219 Gastro-esophageal reflux disease without esophagitis: Secondary | ICD-10-CM

## 2024-12-12 MED ORDER — ROSUVASTATIN CALCIUM 10 MG PO TABS: 10.0000 mg | ORAL_TABLET | Freq: Every day | ORAL | 1 refills | Status: AC

## 2024-12-12 MED ORDER — GABAPENTIN 300 MG PO CAPS: 300.0000 mg | ORAL_CAPSULE | Freq: Four times a day (QID) | ORAL | 1 refills | Status: AC

## 2024-12-12 NOTE — Assessment & Plan Note (Signed)
 Stable.  Diet and nutrition discussed.  Continue rosuvastatin 10 mg daily.

## 2024-12-12 NOTE — Progress Notes (Signed)
 Anne Mills 79 y.o.   Chief Complaint  Patient presents with   Follow-up    HISTORY OF PRESENT ILLNESS: This is a 79 y.o. female here for follow-up of multiple chronic medical conditions Overall doing well. Still having symptoms of GERD.  Will need upper endoscopy No other complaints or medical concerns today.  HPI   Prior to Admission medications  Medication Sig Start Date End Date Taking? Authorizing Provider  acetic acid -hydrocortisone  (VOSOL -HC) OTIC solution Place 3 drops into the left ear 3 (three) times daily. 05/31/24  Yes Tiffanee Mcnee, Emil Schanz, MD  B Complex Vitamins (VITAMIN B-COMPLEX PO) Take by mouth. Liquid   Yes [provider]  Calcium  Carbonate-Vitamin D (CALCIUM  600 + D PO) Take 1 tablet by mouth daily.   Yes [provider]  Cholecalciferol 2000 UNITS TABS Take 1 tablet by mouth daily.   Yes [provider]  fluticasone  (FLONASE ) 50 MCG/ACT nasal spray Place 2 sprays into both nostrils daily. 02/09/23  Yes Kozlow, Eric J, MD  Folic Acid-Cholecalciferol 07-4998 MG-UNIT TABS daily at 6 (six) AM.   Yes [provider]  Magnesium 500 MG CAPS Take by mouth.   Yes [provider]  Misc Natural Products (OSTEO BI-FLEX TRIPLE STRENGTH) TABS Take 2 tablets by mouth daily.   Yes [provider]  Multiple Vitamins-Minerals (STRESS TAB NF) TABS Take 1 tablet by mouth daily.   Yes [provider]  naproxen sodium (ALEVE) 220 MG tablet Take by mouth as needed (pain).   Yes [provider]  OVER THE COUNTER MEDICATION Transitions w/Hesperidin vegetarian taking   Yes [provider]  OVER THE COUNTER MEDICATION Wal-Finate Dw/Pseudoephedrine taking   Yes [provider]  OVER THE COUNTER MEDICATION Take 470 mg by mouth daily. Butcher's broom root   Yes [provider]  Probiotic Product (PROBIOTIC PO) Take by mouth. 10 Billion Active Cultures   Yes [provider]  Trolamine  Salicylate (ASPERCREME EX) Apply 1 application topically 2 (two) times daily as needed (for back pain).   Yes [provider]  Camphor-Menthol-Methyl Sal 1.2-5.7-6.3 % PTCH Apply topically. Patient not taking: Reported on 12/12/2024    [provider]  cyclobenzaprine  (FLEXERIL ) 5 MG tablet Take 1 tablet (5 mg total) by mouth at bedtime. Patient not taking: Reported on 12/12/2024 10/10/21     gabapentin  (NEURONTIN ) 300 MG capsule Take 1 capsule (300 mg total) by mouth 4 (four) times daily. 12/12/24 12/07/25  Anne Deshpande Jose, MD  ipratropium (ATROVENT ) 0.06 % nasal spray Place into both nostrils as needed for rhinitis. Patient not taking: Reported on 12/12/2024    [provider]  rosuvastatin  (CRESTOR ) 10 MG tablet Take 1 tablet (10 mg total) by mouth daily. 12/12/24   Purcell Emil Schanz, MD    Allergies[1]  Patient Active Problem List   Diagnosis Date Noted   Family history of sudden cardiac death (SCD) 2024-05-13   Family history of cardiomyopathy 13-May-2024   Chronic venous insufficiency 01/18/2024   Dyslipidemia 01/13/2022   DDD (degenerative disc disease), cervical 06/01/2019   Degeneration of lumbar or lumbosacral intervertebral disc 06/01/2019   Asthma 12/13/2017   Gastroesophageal reflux disease 12/13/2017   Idiopathic scoliosis and kyphoscoliosis 09/15/2017    Past Medical History:  Diagnosis Date   Allergy    Asthma    no inhaler   Back pain, chronic    Chest pain    under left breast- 2-3 x per week: h/o breast surgery   Chronic  cough 09/09/2023   GERD (gastroesophageal reflux disease)    Headache around the eyes 09/09/2023   Headache(784.0)    Heart murmur    not serious per pt-no echo   Post-nasal drainage 09/09/2023   Scoliosis    Seasonal allergies    Shortness of breath    per pt due to allergies    Past Surgical History:  Procedure Laterality Date   ABDOMINOPLASTY     BREAST SURGERY     lt cyst   COLONOSCOPY     DILATION  AND CURETTAGE OF UTERUS N/A 03/03/2013   Procedure: DILATATION AND CURETTAGE;  Surgeon: Anne Mill, MD;  Location: [facility];  Service: Gynecology;  Laterality: N/A;   EXAMINATION UNDER ANESTHESIA N/A 03/03/2013   Procedure: EXAM UNDER ANESTHESIA with ultrasound;  Surgeon: Anne Mill, MD;  Location: [facility];  Service: Gynecology;  Laterality: N/A;   EYE SURGERY  10/21 ---10/7 /2021   catarct both eyes   TRIGGER FINGER RELEASE Left 06/21/2014   Procedure: LEFT THUMB TRIGGER RELEASE;  Surgeon: Anne JONELLE Curia, MD;  Location: [facility];  Service: Orthopedics;  Laterality: Left;    Social History   Socioeconomic History   Marital status: Married    Spouse name: Not on file   Number of children: Not on file   Years of education: Not on file   Highest education level: Not on file  Occupational History   Occupation: retired    Comment: house cleaning  Tobacco Use   Smoking status: Never   Smokeless tobacco: Never  Vaping Use   Vaping status: Never Used  Substance and Sexual Activity   Alcohol use: No   Drug use: No   Sexual activity: Yes    Birth control/protection: Post-menopausal  Other Topics Concern   Not on file  Social History Narrative   Marital status: married x 41 years; second marriage; happily married.  From [place]. Left [place] 72; grew up [place].      Children:  2 daughters; 8 grandchildren; 10 gg.      Lives: with husband, daughter.      Employment: retired; clean homes      Tobacco; none      Alcohol: none      Drugs: none      Exercise: sporadic in 2019.  Joined [employer].      ADLs: independent with ADLs; no assistant devices.      Advanced Directives:  FULL CODE; no prolonged measures.         Social Drivers of Health   Tobacco Use: Low Risk (12/12/2024)   Patient History    Smoking Tobacco Use: Never    Smokeless Tobacco Use: Never    Passive Exposure: Not on file  Financial Resource Strain: Low Risk (06/20/2024)   Overall  Financial Resource Strain (CARDIA)    Difficulty of Paying Living Expenses: Not hard at all  Food Insecurity: No Food Insecurity (06/20/2024)   Epic    Worried About Programme Researcher, Broadcasting/film/video in the Last Year: Never true    Ran Out of Food in the Last Year: Never true  Transportation Needs: No Transportation Needs (06/20/2024)   Epic    Lack of Transportation (Medical): No    Lack of Transportation (Non-Medical): No  Physical Activity: Inactive (06/20/2024)   Exercise Vital Sign    Days of Exercise per Week: 0 days    Minutes of Exercise per Session: 0 min  Stress: No Stress Concern Present (  06/20/2024)   [employer] of Occupational Health - Occupational Stress Questionnaire    Feeling of Stress: Not at all  Social Connections: Socially Integrated (06/20/2024)   Social Connection and Isolation Panel    Frequency of Communication with Friends and Family: More than three times a week    Frequency of Social Gatherings with Friends and Family: More than three times a week    Attends Religious Services: More than 4 times per year    Active Member of Clubs or Organizations: Yes    Attends Banker Meetings: More than 4 times per year    Marital Status: Married  Catering Manager Violence: Not At Risk (06/20/2024)   Epic    Fear of Current or Ex-Partner: No    Emotionally Abused: No    Physically Abused: No    Sexually Abused: No  Depression (PHQ2-9): Low Risk (12/12/2024)   Depression (PHQ2-9)    PHQ-2 Score: 0  Alcohol Screen: Low Risk (06/20/2024)   Alcohol Screen    Last Alcohol Screening Score (AUDIT): 0  Housing: Unknown (06/20/2024)   Epic    Unable to Pay for Housing in the Last Year: No    Number of Times Moved in the Last Year: Not on file    Homeless in the Last Year: No  Utilities: Not At Risk (06/20/2024)   Epic    Threatened with loss of utilities: No  Health Literacy: Adequate Health Literacy (06/20/2024)   B1300 Health Literacy    Frequency of need for help with  medical instructions: Never    Family History  Problem Relation Age of Onset   Dementia Mother    Hypertension Mother    Cancer Mother 62       Breast cancer   Depression Mother    Cancer Sister    Diabetes Sister    Heart disease Sister 21       AMI as cause of death   Hyperlipidemia Sister    Hypertension Sister    Mental retardation Sister    Cancer Father        prostate cancer   Heart disease Sister 65       AMI   Diabetes Sister      Review of Systems  Constitutional: Negative.  Negative for chills and fever.  HENT: Negative.  Negative for congestion and sore throat.   Respiratory: Negative.  Negative for cough and shortness of breath.   Cardiovascular: Negative.  Negative for chest pain and palpitations.  Gastrointestinal:  Positive for heartburn. Negative for abdominal pain, diarrhea, nausea and vomiting.  Genitourinary: Negative.  Negative for dysuria and hematuria.  Skin: Negative.  Negative for rash.  Neurological: Negative.  Negative for dizziness and headaches.  All other systems reviewed and are negative.   Vitals:   12/12/24 1441  BP: 134/88  Pulse: 88  SpO2: 99%    Physical Exam Vitals reviewed.  Constitutional:      Appearance: Normal appearance.  HENT:     Head: Normocephalic.     Mouth/Throat:     Mouth: Mucous membranes are moist.     Pharynx: Oropharynx is clear.  Eyes:     Extraocular Movements: Extraocular movements intact.     Conjunctiva/sclera: Conjunctivae normal.     Pupils: Pupils are equal, round, and reactive to light.  Cardiovascular:     Rate and Rhythm: Normal rate and regular rhythm.     Pulses: Normal pulses.     Heart sounds: Normal heart  sounds.  Pulmonary:     Effort: Pulmonary effort is normal.     Breath sounds: Normal breath sounds.  Abdominal:     Palpations: Abdomen is soft.     Tenderness: There is no abdominal tenderness.  Musculoskeletal:     Cervical back: No tenderness.  Lymphadenopathy:      Cervical: No cervical adenopathy.  Skin:    General: Skin is warm and dry.     Capillary Refill: Capillary refill takes less than 2 seconds.  Neurological:     General: No focal deficit present.     Mental Status: She is alert and oriented to person, place, and time.  Psychiatric:        Mood and Affect: Mood normal.        Behavior: Behavior normal.      ASSESSMENT & PLAN: A total of 40 minutes was spent with the patient and counseling/coordination of care regarding preparing for this visit, review of most recent office visit notes, review of multiple chronic medical conditions and their management, review of all medications, review of most recent bloodwork results, review of health maintenance items, education on nutrition, prognosis, documentation, and need for follow up.   Problem List Items Addressed This Visit       Cardiovascular and Mediastinum   Chronic venous insufficiency   Intermittent bilateral leg edema No swelling today.  No signs of cellulitis Good peripheral arterial distal circulation      Relevant Medications   rosuvastatin  (CRESTOR ) 10 MG tablet     Respiratory   Asthma   Well-controlled. Not actively using any inhalers at present time         Digestive   Gastroesophageal reflux disease - Primary   Active and affecting quality of life Not taking any medication at present time Will need upper endoscopy Patient has establish care with a local GI doctor for colonoscopy         Musculoskeletal and Integument   Degeneration of lumbar or lumbosacral intervertebral disc   Relevant Medications   gabapentin  (NEURONTIN ) 300 MG capsule     Other   Dyslipidemia   Stable.  Diet and nutrition discussed. Continue rosuvastatin  10 mg daily.      Relevant Medications   rosuvastatin  (CRESTOR ) 10 MG tablet   Patient Instructions  Health Maintenance After Age 13 After age 36, you are at a higher risk for certain long-term diseases and infections as well  as injuries from falls. Falls are a major cause of broken bones and head injuries in people who are older than age 4. Getting regular preventive care can help to keep you healthy and well. Preventive care includes getting regular testing and making lifestyle changes as recommended by your health care provider. Talk with your health care provider about: Which screenings and tests you should have. A screening is a test that checks for a disease when you have no symptoms. A diet and exercise plan that is right for you. What should I know about screenings and tests to prevent falls? Screening and testing are the best ways to find a health problem early. Early diagnosis and treatment give you the best chance of managing medical conditions that are common after age 55. Certain conditions and lifestyle choices may make you more likely to have a fall. Your health care provider may recommend: Regular vision checks. Poor vision and conditions such as cataracts can make you more likely to have a fall. If you wear glasses, make sure to get  your prescription updated if your vision changes. Medicine review. Work with your health care provider to regularly review all of the medicines you are taking, including over-the-counter medicines. Ask your health care provider about any side effects that may make you more likely to have a fall. Tell your health care provider if any medicines that you take make you feel dizzy or sleepy. Strength and balance checks. Your health care provider may recommend certain tests to check your strength and balance while standing, walking, or changing positions. Foot health exam. Foot pain and numbness, as well as not wearing proper footwear, can make you more likely to have a fall. Screenings, including: Osteoporosis screening. Osteoporosis is a condition that causes the bones to get weaker and break more easily. Blood pressure screening. Blood pressure changes and medicines to control blood  pressure can make you feel dizzy. Depression screening. You may be more likely to have a fall if you have a fear of falling, feel depressed, or feel unable to do activities that you used to do. Alcohol use screening. Using too much alcohol can affect your balance and may make you more likely to have a fall. Follow these instructions at home: Lifestyle Do not drink alcohol if: Your health care provider tells you not to drink. If you drink alcohol: Limit how much you have to: 0-1 drink a day for women. 0-2 drinks a day for men. Know how much alcohol is in your drink. In the [place] one drink equals one 12 oz bottle of beer (355 mL), one 5 oz glass of wine (148 mL), or one 1 oz glass of hard liquor (44 mL). Do not use any products that contain nicotine or tobacco. These products include cigarettes, chewing tobacco, and vaping devices, such as e-cigarettes. If you need help quitting, ask your health care provider. Activity  Follow a regular exercise program to stay fit. This will help you maintain your balance. Ask your health care provider what types of exercise are appropriate for you. If you need a cane or walker, use it as recommended by your health care provider. Wear supportive shoes that have nonskid soles. Safety  Remove any tripping hazards, such as rugs, cords, and clutter. Install safety equipment such as grab bars in bathrooms and safety rails on stairs. Keep rooms and walkways well-lit. General instructions Talk with your health care provider about your risks for falling. Tell your health care provider if: You fall. Be sure to tell your health care provider about all falls, even ones that seem minor. You feel dizzy, tiredness (fatigue), or off-balance. Take over-the-counter and prescription medicines only as told by your health care provider. These include supplements. Eat a healthy diet and maintain a healthy weight. A healthy diet includes low-fat dairy products, low-fat (lean)  meats, and fiber from whole grains, beans, and lots of fruits and vegetables. Stay current with your vaccines. Schedule regular health, dental, and eye exams. Summary Having a healthy lifestyle and getting preventive care can help to protect your health and wellness after age 33. Screening and testing are the best way to find a health problem early and help you avoid having a fall. Early diagnosis and treatment give you the best chance for managing medical conditions that are more common for people who are older than age 53. Falls are a major cause of broken bones and head injuries in people who are older than age 81. Take precautions to prevent a fall at home. Work with your health care provider  to learn what changes you can make to improve your health and wellness and to prevent falls. This information is not intended to replace advice given to you by your health care provider. Make sure you discuss any questions you have with your health care provider. Document Revised: 04/14/2021 Document Reviewed: 04/14/2021 Elsevier Patient Education  2024 Elsevier Inc.      Emil Schaumann, MD [facility]    [1]  Allergies Allergen Reactions   Gluten Meal Cough   Biaxin [Clarithromycin] Nausea And Vomiting

## 2024-12-12 NOTE — Assessment & Plan Note (Signed)
 Intermittent bilateral leg edema No swelling today.  No signs of cellulitis Good peripheral arterial distal circulation

## 2024-12-12 NOTE — Assessment & Plan Note (Signed)
 Active and affecting quality of life Not taking any medication at present time Will need upper endoscopy Patient has establish care with a local GI doctor for colonoscopy

## 2024-12-12 NOTE — Patient Instructions (Signed)
 Health Maintenance After Age 78 After age 27, you are at a higher risk for certain long-term diseases and infections as well as injuries from falls. Falls are a major cause of broken bones and head injuries in people who are older than age 73. Getting regular preventive care can help to keep you healthy and well. Preventive care includes getting regular testing and making lifestyle changes as recommended by your health care provider. Talk with your health care provider about: Which screenings and tests you should have. A screening is a test that checks for a disease when you have no symptoms. A diet and exercise plan that is right for you. What should I know about screenings and tests to prevent falls? Screening and testing are the best ways to find a health problem early. Early diagnosis and treatment give you the best chance of managing medical conditions that are common after age 90. Certain conditions and lifestyle choices may make you more likely to have a fall. Your health care provider may recommend: Regular vision checks. Poor vision and conditions such as cataracts can make you more likely to have a fall. If you wear glasses, make sure to get your prescription updated if your vision changes. Medicine review. Work with your health care provider to regularly review all of the medicines you are taking, including over-the-counter medicines. Ask your health care provider about any side effects that may make you more likely to have a fall. Tell your health care provider if any medicines that you take make you feel dizzy or sleepy. Strength and balance checks. Your health care provider may recommend certain tests to check your strength and balance while standing, walking, or changing positions. Foot health exam. Foot pain and numbness, as well as not wearing proper footwear, can make you more likely to have a fall. Screenings, including: Osteoporosis screening. Osteoporosis is a condition that causes  the bones to get weaker and break more easily. Blood pressure screening. Blood pressure changes and medicines to control blood pressure can make you feel dizzy. Depression screening. You may be more likely to have a fall if you have a fear of falling, feel depressed, or feel unable to do activities that you used to do. Alcohol  use screening. Using too much alcohol  can affect your balance and may make you more likely to have a fall. Follow these instructions at home: Lifestyle Do not drink alcohol  if: Your health care provider tells you not to drink. If you drink alcohol : Limit how much you have to: 0-1 drink a day for women. 0-2 drinks a day for men. Know how much alcohol  is in your drink. In the [place] one drink equals one 12 oz bottle of beer (355 mL), one 5 oz glass of wine (148 mL), or one 1 oz glass of hard liquor (44 mL). Do not use any products that contain nicotine or tobacco. These products include cigarettes, chewing tobacco, and vaping devices, such as e-cigarettes. If you need help quitting, ask your health care provider. Activity  Follow a regular exercise program to stay fit. This will help you maintain your balance. Ask your health care provider what types of exercise are appropriate for you. If you need a cane or walker, use it as recommended by your health care provider. Wear supportive shoes that have nonskid soles. Safety  Remove any tripping hazards, such as rugs, cords, and clutter. Install safety equipment such as grab bars in bathrooms and safety rails on stairs. Keep rooms and walkways  well-lit. General instructions Talk with your health care provider about your risks for falling. Tell your health care provider if: You fall. Be sure to tell your health care provider about all falls, even ones that seem minor. You feel dizzy, tiredness (fatigue), or off-balance. Take over-the-counter and prescription medicines only as told by your health care provider. These include  supplements. Eat a healthy diet and maintain a healthy weight. A healthy diet includes low-fat dairy products, low-fat (lean) meats, and fiber from whole grains, beans, and lots of fruits and vegetables. Stay current with your vaccines. Schedule regular health, dental, and eye exams. Summary Having a healthy lifestyle and getting preventive care can help to protect your health and wellness after age 15. Screening and testing are the best way to find a health problem early and help you avoid having a fall. Early diagnosis and treatment give you the best chance for managing medical conditions that are more common for people who are older than age 42. Falls are a major cause of broken bones and head injuries in people who are older than age 64. Take precautions to prevent a fall at home. Work with your health care provider to learn what changes you can make to improve your health and wellness and to prevent falls. This information is not intended to replace advice given to you by your health care provider. Make sure you discuss any questions you have with your health care provider. Document Revised: 04/14/2021 Document Reviewed: 04/14/2021 Elsevier Patient Education  2024 [employer].

## 2024-12-12 NOTE — Assessment & Plan Note (Signed)
Well-controlled.  Not actively using any inhalers at present time.

## 2025-06-27 ENCOUNTER — Ambulatory Visit
